# Patient Record
Sex: Female | Born: 1990
Health system: Southern US, Community
[De-identification: ages and names within clinical notes are randomized; demographics above are authoritative.]

## PROBLEM LIST (undated history)

## (undated) ENCOUNTER — Emergency Department (HOSPITAL_COMMUNITY): Admission: EM | Payer: Medicaid Other | Source: Home / Self Care

## (undated) DIAGNOSIS — E785 Hyperlipidemia, unspecified: Secondary | ICD-10-CM

## (undated) DIAGNOSIS — T7840XA Allergy, unspecified, initial encounter: Secondary | ICD-10-CM

## (undated) DIAGNOSIS — R945 Abnormal results of liver function studies: Secondary | ICD-10-CM

## (undated) DIAGNOSIS — B019 Varicella without complication: Secondary | ICD-10-CM

## (undated) DIAGNOSIS — Z0000 Encounter for general adult medical examination without abnormal findings: Secondary | ICD-10-CM

## (undated) DIAGNOSIS — F329 Major depressive disorder, single episode, unspecified: Secondary | ICD-10-CM

## (undated) DIAGNOSIS — J45909 Unspecified asthma, uncomplicated: Secondary | ICD-10-CM

## (undated) DIAGNOSIS — I10 Essential (primary) hypertension: Secondary | ICD-10-CM

## (undated) DIAGNOSIS — F418 Other specified anxiety disorders: Secondary | ICD-10-CM

## (undated) DIAGNOSIS — E663 Overweight: Secondary | ICD-10-CM

## (undated) DIAGNOSIS — Z7289 Other problems related to lifestyle: Secondary | ICD-10-CM

## (undated) DIAGNOSIS — F32A Depression, unspecified: Secondary | ICD-10-CM

## (undated) DIAGNOSIS — G4700 Insomnia, unspecified: Principal | ICD-10-CM

## (undated) HISTORY — DX: Overweight: E66.3

## (undated) HISTORY — DX: Unspecified asthma, uncomplicated: J45.909

## (undated) HISTORY — DX: Depression, unspecified: F32.A

## (undated) HISTORY — DX: Other problems related to lifestyle: Z72.89

## (undated) HISTORY — DX: Hyperlipidemia, unspecified: E78.5

## (undated) HISTORY — DX: Encounter for general adult medical examination without abnormal findings: Z00.00

## (undated) HISTORY — DX: Major depressive disorder, single episode, unspecified: F32.9

## (undated) HISTORY — DX: Insomnia, unspecified: G47.00

## (undated) HISTORY — DX: Abnormal results of liver function studies: R94.5

## (undated) HISTORY — PX: WISDOM TOOTH EXTRACTION: SHX21

## (undated) HISTORY — DX: Varicella without complication: B01.9

## (undated) HISTORY — DX: Essential (primary) hypertension: I10

## (undated) HISTORY — DX: Other specified anxiety disorders: F41.8

## (undated) HISTORY — DX: Allergy, unspecified, initial encounter: T78.40XA

---

## 2012-05-28 ENCOUNTER — Ambulatory Visit (INDEPENDENT_AMBULATORY_CARE_PROVIDER_SITE_OTHER): Payer: Managed Care, Other (non HMO) | Admitting: Family Medicine

## 2012-05-28 ENCOUNTER — Encounter: Payer: Self-pay | Admitting: Family Medicine

## 2012-05-28 VITALS — BP 116/81 | HR 91 | Temp 98.2°F | Ht 60.25 in | Wt 137.4 lb

## 2012-05-28 DIAGNOSIS — Z0000 Encounter for general adult medical examination without abnormal findings: Secondary | ICD-10-CM

## 2012-05-28 DIAGNOSIS — F418 Other specified anxiety disorders: Secondary | ICD-10-CM | POA: Insufficient documentation

## 2012-05-28 DIAGNOSIS — F32A Depression, unspecified: Secondary | ICD-10-CM

## 2012-05-28 DIAGNOSIS — J45909 Unspecified asthma, uncomplicated: Secondary | ICD-10-CM

## 2012-05-28 DIAGNOSIS — Z23 Encounter for immunization: Secondary | ICD-10-CM

## 2012-05-28 DIAGNOSIS — F3289 Other specified depressive episodes: Secondary | ICD-10-CM

## 2012-05-28 DIAGNOSIS — F329 Major depressive disorder, single episode, unspecified: Secondary | ICD-10-CM

## 2012-05-28 DIAGNOSIS — T7840XA Allergy, unspecified, initial encounter: Secondary | ICD-10-CM

## 2012-05-28 HISTORY — DX: Encounter for general adult medical examination without abnormal findings: Z00.00

## 2012-05-28 MED ORDER — TETANUS-DIPHTH-ACELL PERTUSSIS 5-2.5-18.5 LF-MCG/0.5 IM SUSP: 0.5000 mL | Freq: Once | INTRAMUSCULAR | Status: DC

## 2012-05-28 MED ORDER — MONTELUKAST SODIUM 10 MG PO TABS: 10.0000 mg | ORAL_TABLET | Freq: Every evening | ORAL | Status: DC | PRN

## 2012-05-28 MED ORDER — CETIRIZINE HCL 10 MG PO TABS: 10.0000 mg | ORAL_TABLET | Freq: Every day | ORAL | Status: DC | PRN

## 2012-05-28 NOTE — Assessment & Plan Note (Signed)
On Lexapro, Wellbutrin and Vistaril

## 2012-05-28 NOTE — Assessment & Plan Note (Addendum)
Given Tdap and flu shot today. Old records requested. Encouraged seat belts, heart healthy diet, increased exercise

## 2012-05-28 NOTE — Assessment & Plan Note (Addendum)
Will add Singulair, take Zyrtec daily.

## 2012-05-28 NOTE — Progress Notes (Signed)
Patient ID: Cynthia Salazar, female   DOB: November 05, 1990, 21 y.o.   MRN: [id number] [facility] [id number] 1991-04-24 05/28/2012      Progress Note New Patient  Subjective  Chief Complaint  Chief Complaint  Patient presents with  . Establish Care    new patient    HPI  Patient is a 21 year old Caucasian female in today to establish care. She is generally in good health although she does struggle with asthma. She reports using her albuterol roughly 5-6 times a week. She needs to exercise but she also uses it randomly up to 6 times each week. She's had occasional pattern. She does struggle with allergies nasal congestion postnasal drip. No recent, fevers, chills, chest pain or palpitations, GI or GU complaints. She struggles with was done is current on his medicines about 6 months and does believe is helping. Follows with&  Past Medical History  Diagnosis Date  . Chicken pox as a child  . Depression 21 yrs old  . Preventative health care 05/28/2012    Past Surgical History  Procedure Date  . Wisdom tooth extraction 21 yrs    Family History  Problem Relation Age of Onset  . Nephrolithiasis Father   . Nephrolithiasis Brother   . COPD Paternal Grandmother     History   Social History  . Marital Status: Single    Spouse Name: N/A    Number of Children: N/A  . Years of Education: N/A   Occupational History  . Not on file.   Social History Main Topics  . Smoking status: Former Games developer  . Smokeless tobacco: Never Used   Comment: occasionally smoked one  . Alcohol Use: Yes     occasionally  . Drug Use: No  . Sexually Active: Yes -- Female partner(s)   Other Topics Concern  . Not on file   Social History Narrative  . No narrative on file    Current Outpatient Prescriptions on File Prior to Visit  Medication Sig Dispense Refill  . albuterol (PROVENTIL HFA;VENTOLIN HFA) 108 (90 BASE) MCG/ACT inhaler Inhale 2 puffs into the lungs every 4 (four) hours as needed.        Marland Kitchen buPROPion (WELLBUTRIN XL) 150 MG 24 hr tablet Take 150 mg by mouth daily.      Marland Kitchen escitalopram (LEXAPRO) 10 MG tablet Take 10 mg by mouth daily.      Marland Kitchen lamoTRIgine (LAMICTAL) 100 MG tablet Take 100 mg by mouth daily.        Allergies  Allergen Reactions  . Aleve (Naproxen Sodium)     wheeze    Review of Systems  Review of Systems  Constitutional: Negative for fever, chills and malaise/fatigue.  HENT: Negative for hearing loss, nosebleeds and congestion.   Eyes: Negative for discharge.  Respiratory: Negative for cough, sputum production, shortness of breath and wheezing.   Cardiovascular: Negative for chest pain, palpitations and leg swelling.  Gastrointestinal: Negative for heartburn, nausea, vomiting, abdominal pain, diarrhea, constipation and blood in stool.  Genitourinary: Negative for dysuria, urgency, frequency and hematuria.  Musculoskeletal: Negative for myalgias, back pain and falls.  Skin: Negative for rash.  Neurological: Negative for dizziness, tremors, sensory change, focal weakness, loss of consciousness, weakness and headaches.  Endo/Heme/Allergies: Negative for polydipsia. Does not bruise/bleed easily.  Psychiatric/Behavioral: Negative for depression and suicidal ideas. The patient is not nervous/anxious and does not have insomnia.     Objective  BP 116/81  Pulse 91  Temp 98.2 F (36.8 C) (Temporal)  Ht  5' 0.25" (1.53 m)  Wt 137 lb 6.4 oz (62.324 kg)  BMI 26.61 kg/m2  SpO2 97%  LMP 05/12/2012  Physical Exam  Physical Exam  Constitutional: She is oriented to person, place, and time and well-developed, well-nourished, and in no distress. No distress.  HENT:  Head: Normocephalic and atraumatic.  Right Ear: External ear normal.  Left Ear: External ear normal.  Nose: Nose normal.  Mouth/Throat: Oropharynx is clear and moist. No oropharyngeal exudate.  Eyes: Conjunctivae normal are normal. Pupils are equal, round, and reactive to light. Right eye exhibits  no discharge. Left eye exhibits no discharge. No scleral icterus.  Neck: Normal range of motion. Neck supple. No thyromegaly present.  Cardiovascular: Normal rate, regular rhythm, normal heart sounds and intact distal pulses.   No murmur heard. Pulmonary/Chest: Effort normal and breath sounds normal. No respiratory distress. She has no wheezes. She has no rales.  Abdominal: Soft. Bowel sounds are normal. She exhibits no distension and no mass. There is no tenderness.  Musculoskeletal: Normal range of motion. She exhibits no edema and no tenderness.  Lymphadenopathy:    She has no cervical adenopathy.  Neurological: She is alert and oriented to person, place, and time. She has normal reflexes. No cranial nerve deficit. Coordination normal.  Skin: Skin is warm and dry. No rash noted. She is not diaphoretic.  Psychiatric: Mood, memory and affect normal.       Assessment & Plan   Depression On Lexapro, Wellbutrin and Vistaril  Preventative health care Given Tdap and flu shot today. Old records requested. Encouraged seat belts, heart healthy diet, increased exercise  Allergic state Will add Singulair, take Zyrtec daily.  Asthma Add Singulair and continue Albuterol prn

## 2012-05-28 NOTE — Patient Instructions (Addendum)
Preventive Care for Adults, Female A healthy lifestyle and preventive care can promote health and wellness. Preventive health guidelines for women include the following key practices.  A routine yearly physical is a good way to check with your caregiver about your health and preventive screening. It is a chance to share any concerns and updates on your health, and to receive a thorough exam.   Visit your dentist for a routine exam and preventive care every 6 months. Brush your teeth twice a day and floss once a day. Good oral hygiene prevents tooth decay and gum disease.   The frequency of eye exams is based on your age, health, family medical history, use of contact lenses, and other factors. Follow your caregiver's recommendations for frequency of eye exams.   Eat a healthy diet. Foods like vegetables, fruits, whole grains, low-fat dairy products, and lean protein foods contain the nutrients you need without too many calories. Decrease your intake of foods high in solid fats, added sugars, and salt. Eat the right amount of calories for you.Get information about a proper diet from your caregiver, if necessary.   Regular physical exercise is one of the most important things you can do for your health. Most adults should get at least 150 minutes of moderate-intensity exercise (any activity that increases your heart rate and causes you to sweat) each week. In addition, most adults need muscle-strengthening exercises on 2 or more days a week.   Maintain a healthy weight. The body mass index (BMI) is a screening tool to identify possible weight problems. It provides an estimate of body fat based on height and weight. Your caregiver can help determine your BMI, and can help you achieve or maintain a healthy weight.For adults 20 years and older:   A BMI below 18.5 is considered underweight.   A BMI of 18.5 to 24.9 is normal.   A BMI of 25 to 29.9 is considered overweight.   A BMI of 30 and above is  considered obese.   Maintain normal blood lipids and cholesterol levels by exercising and minimizing your intake of saturated fat. Eat a balanced diet with plenty of fruit and vegetables. Blood tests for lipids and cholesterol should begin at age 20 and be repeated every 5 years. If your lipid or cholesterol levels are high, you are over 50, or you are at high risk for heart disease, you may need your cholesterol levels checked more frequently.Ongoing high lipid and cholesterol levels should be treated with medicines if diet and exercise are not effective.   If you smoke, find out from your caregiver how to quit. If you do not use tobacco, do not start.   If you are pregnant, do not drink alcohol. If you are breastfeeding, be very cautious about drinking alcohol. If you are not pregnant and choose to drink alcohol, do not exceed 1 drink per day. One drink is considered to be 12 ounces (355 mL) of beer, 5 ounces (148 mL) of wine, or 1.5 ounces (44 mL) of liquor.   Avoid use of street drugs. Do not share needles with anyone. Ask for help if you need support or instructions about stopping the use of drugs.   High blood pressure causes heart disease and increases the risk of stroke. Your blood pressure should be checked at least every 1 to 2 years. Ongoing high blood pressure should be treated with medicines if weight loss and exercise are not effective.   If you are 55 to 22   years old, ask your caregiver if you should take aspirin to prevent strokes.   Diabetes screening involves taking a blood sample to check your fasting blood sugar level. This should be done once every 3 years, after age 45, if you are within normal weight and without risk factors for diabetes. Testing should be considered at a younger age or be carried out more frequently if you are overweight and have at least 1 risk factor for diabetes.   Breast cancer screening is essential preventive care for women. You should practice "breast  self-awareness." This means understanding the normal appearance and feel of your breasts and may include breast self-examination. Any changes detected, no matter how small, should be reported to a caregiver. Women in their 20s and 30s should have a clinical breast exam (CBE) by a caregiver as part of a regular health exam every 1 to 3 years. After age 40, women should have a CBE every year. Starting at age 40, women should consider having a mammography (breast X-ray test) every year. Women who have a family history of breast cancer should talk to their caregiver about genetic screening. Women at a high risk of breast cancer should talk to their caregivers about having magnetic resonance imaging (MRI) and a mammography every year.   The Pap test is a screening test for cervical cancer. A Pap test can show cell changes on the cervix that might become cervical cancer if left untreated. A Pap test is a procedure in which cells are obtained and examined from the lower end of the uterus (cervix).   Women should have a Pap test starting at age 21.   Between ages 21 and 29, Pap tests should be repeated every 2 years.   Beginning at age 30, you should have a Pap test every 3 years as long as the past 3 Pap tests have been normal.   Some women have medical problems that increase the chance of getting cervical cancer. Talk to your caregiver about these problems. It is especially important to talk to your caregiver if a new problem develops soon after your last Pap test. In these cases, your caregiver may recommend more frequent screening and Pap tests.   The above recommendations are the same for women who have or have not gotten the vaccine for human papillomavirus (HPV).   If you had a hysterectomy for a problem that was not cancer or a condition that could lead to cancer, then you no longer need Pap tests. Even if you no longer need a Pap test, a regular exam is a good idea to make sure no other problems are  starting.   If you are between ages 65 and 70, and you have had normal Pap tests going back 10 years, you no longer need Pap tests. Even if you no longer need a Pap test, a regular exam is a good idea to make sure no other problems are starting.   If you have had past treatment for cervical cancer or a condition that could lead to cancer, you need Pap tests and screening for cancer for at least 20 years after your treatment.   If Pap tests have been discontinued, risk factors (such as a new sexual partner) need to be reassessed to determine if screening should be resumed.   The HPV test is an additional test that may be used for cervical cancer screening. The HPV test looks for the virus that can cause the cell changes on the cervix.   The cells collected during the Pap test can be tested for HPV. The HPV test could be used to screen women aged 30 years and older, and should be used in women of any age who have unclear Pap test results. After the age of 30, women should have HPV testing at the same frequency as a Pap test.   Colorectal cancer can be detected and often prevented. Most routine colorectal cancer screening begins at the age of 50 and continues through age 75. However, your caregiver may recommend screening at an earlier age if you have risk factors for colon cancer. On a yearly basis, your caregiver may provide home test kits to check for hidden blood in the stool. Use of a small camera at the end of a tube, to directly examine the colon (sigmoidoscopy or colonoscopy), can detect the earliest forms of colorectal cancer. Talk to your caregiver about this at age 50, when routine screening begins. Direct examination of the colon should be repeated every 5 to 10 years through age 75, unless early forms of pre-cancerous polyps or small growths are found.   Hepatitis C blood testing is recommended for all people born from 1945 through 1965 and any individual with known risks for hepatitis C.    Practice safe sex. Use condoms and avoid high-risk sexual practices to reduce the spread of sexually transmitted infections (STIs). STIs include gonorrhea, chlamydia, syphilis, trichomonas, herpes, HPV, and human immunodeficiency virus (HIV). Herpes, HIV, and HPV are viral illnesses that have no cure. They can result in disability, cancer, and death. Sexually active women aged 25 and younger should be checked for chlamydia. Older women with new or multiple partners should also be tested for chlamydia. Testing for other STIs is recommended if you are sexually active and at increased risk.   Osteoporosis is a disease in which the bones lose minerals and strength with aging. This can result in serious bone fractures. The risk of osteoporosis can be identified using a bone density scan. Women ages 65 and over and women at risk for fractures or osteoporosis should discuss screening with their caregivers. Ask your caregiver whether you should take a calcium supplement or vitamin D to reduce the rate of osteoporosis.   Menopause can be associated with physical symptoms and risks. Hormone replacement therapy is available to decrease symptoms and risks. You should talk to your caregiver about whether hormone replacement therapy is right for you.   Use sunscreen with sun protection factor (SPF) of 30 or more. Apply sunscreen liberally and repeatedly throughout the day. You should seek shade when your shadow is shorter than you. Protect yourself by wearing long sleeves, pants, a wide-brimmed hat, and sunglasses year round, whenever you are outdoors.   Once a month, do a whole body skin exam, using a mirror to look at the skin on your back. Notify your caregiver of new moles, moles that have irregular borders, moles that are larger than a pencil eraser, or moles that have changed in shape or color.   Stay current with required immunizations.   Influenza. You need a dose every fall (or winter). The composition of  the flu vaccine changes each year, so being vaccinated once is not enough.   Pneumococcal polysaccharide. You need 1 to 2 doses if you smoke cigarettes or if you have certain chronic medical conditions. You need 1 dose at age 65 (or older) if you have never been vaccinated.   Tetanus, diphtheria, pertussis (Tdap, Td). Get 1 dose of   Tdap vaccine if you are younger than age 65, are over 65 and have contact with an infant, are a healthcare worker, are pregnant, or simply want to be protected from whooping cough. After that, you need a Td booster dose every 10 years. Consult your caregiver if you have not had at least 3 tetanus and diphtheria-containing shots sometime in your life or have a deep or dirty wound.   HPV. You need this vaccine if you are a woman age 26 or younger. The vaccine is given in 3 doses over 6 months.   Measles, mumps, rubella (MMR). You need at least 1 dose of MMR if you were born in 1957 or later. You may also need a second dose.   Meningococcal. If you are age 19 to 21 and a first-year college student living in a residence hall, or have one of several medical conditions, you need to get vaccinated against meningococcal disease. You may also need additional booster doses.   Zoster (shingles). If you are age 60 or older, you should get this vaccine.   Varicella (chickenpox). If you have never had chickenpox or you were vaccinated but received only 1 dose, talk to your caregiver to find out if you need this vaccine.   Hepatitis A. You need this vaccine if you have a specific risk factor for hepatitis A virus infection or you simply wish to be protected from this disease. The vaccine is usually given as 2 doses, 6 to 18 months apart.   Hepatitis B. You need this vaccine if you have a specific risk factor for hepatitis B virus infection or you simply wish to be protected from this disease. The vaccine is given in 3 doses, usually over 6 months.  Preventive Services /  Frequency Ages 19 to 39  Blood pressure check.** / Every 1 to 2 years.   Lipid and cholesterol check.** / Every 5 years beginning at age 20.   Clinical breast exam.** / Every 3 years for women in their 20s and 30s.   Pap test.** / Every 2 years from ages 21 through 29. Every 3 years starting at age 30 through age 65 or 70 with a history of 3 consecutive normal Pap tests.   HPV screening.** / Every 3 years from ages 30 through ages 65 to 70 with a history of 3 consecutive normal Pap tests.   Hepatitis C blood test.** / For any individual with known risks for hepatitis C.   Skin self-exam. / Monthly.   Influenza immunization.** / Every year.   Pneumococcal polysaccharide immunization.** / 1 to 2 doses if you smoke cigarettes or if you have certain chronic medical conditions.   Tetanus, diphtheria, pertussis (Tdap, Td) immunization. / A one-time dose of Tdap vaccine. After that, you need a Td booster dose every 10 years.   HPV immunization. / 3 doses over 6 months, if you are 26 and younger.   Measles, mumps, rubella (MMR) immunization. / You need at least 1 dose of MMR if you were born in 1957 or later. You may also need a second dose.   Meningococcal immunization. / 1 dose if you are age 19 to 21 and a first-year college student living in a residence hall, or have one of several medical conditions, you need to get vaccinated against meningococcal disease. You may also need additional booster doses.   Varicella immunization.** / Consult your caregiver.   Hepatitis A immunization.** / Consult your caregiver. 2 doses, 6 to 18 months   apart.   Hepatitis B immunization.** / Consult your caregiver. 3 doses usually over 6 months.  Ages 40 to 64  Blood pressure check.** / Every 1 to 2 years.   Lipid and cholesterol check.** / Every 5 years beginning at age 20.   Clinical breast exam.** / Every year after age 40.   Mammogram.** / Every year beginning at age 40 and continuing for as  long as you are in good health. Consult with your caregiver.   Pap test.** / Every 3 years starting at age 30 through age 65 or 70 with a history of 3 consecutive normal Pap tests.   HPV screening.** / Every 3 years from ages 30 through ages 65 to 70 with a history of 3 consecutive normal Pap tests.   Fecal occult blood test (FOBT) of stool. / Every year beginning at age 50 and continuing until age 75. You may not need to do this test if you get a colonoscopy every 10 years.   Flexible sigmoidoscopy or colonoscopy.** / Every 5 years for a flexible sigmoidoscopy or every 10 years for a colonoscopy beginning at age 50 and continuing until age 75.   Hepatitis C blood test.** / For all people born from 1945 through 1965 and any individual with known risks for hepatitis C.   Skin self-exam. / Monthly.   Influenza immunization.** / Every year.   Pneumococcal polysaccharide immunization.** / 1 to 2 doses if you smoke cigarettes or if you have certain chronic medical conditions.   Tetanus, diphtheria, pertussis (Tdap, Td) immunization.** / A one-time dose of Tdap vaccine. After that, you need a Td booster dose every 10 years.   Measles, mumps, rubella (MMR) immunization. / You need at least 1 dose of MMR if you were born in 1957 or later. You may also need a second dose.   Varicella immunization.** / Consult your caregiver.   Meningococcal immunization.** / Consult your caregiver.   Hepatitis A immunization.** / Consult your caregiver. 2 doses, 6 to 18 months apart.   Hepatitis B immunization.** / Consult your caregiver. 3 doses, usually over 6 months.  Ages 65 and over  Blood pressure check.** / Every 1 to 2 years.   Lipid and cholesterol check.** / Every 5 years beginning at age 20.   Clinical breast exam.** / Every year after age 40.   Mammogram.** / Every year beginning at age 40 and continuing for as long as you are in good health. Consult with your caregiver.   Pap test.** /  Every 3 years starting at age 30 through age 65 or 70 with a 3 consecutive normal Pap tests. Testing can be stopped between 65 and 70 with 3 consecutive normal Pap tests and no abnormal Pap or HPV tests in the past 10 years.   HPV screening.** / Every 3 years from ages 30 through ages 65 or 70 with a history of 3 consecutive normal Pap tests. Testing can be stopped between 65 and 70 with 3 consecutive normal Pap tests and no abnormal Pap or HPV tests in the past 10 years.   Fecal occult blood test (FOBT) of stool. / Every year beginning at age 50 and continuing until age 75. You may not need to do this test if you get a colonoscopy every 10 years.   Flexible sigmoidoscopy or colonoscopy.** / Every 5 years for a flexible sigmoidoscopy or every 10 years for a colonoscopy beginning at age 50 and continuing until age 75.   Hepatitis   C blood test.** / For all people born from 1945 through 1965 and any individual with known risks for hepatitis C.   Osteoporosis screening.** / A one-time screening for women ages 65 and over and women at risk for fractures or osteoporosis.   Skin self-exam. / Monthly.   Influenza immunization.** / Every year.   Pneumococcal polysaccharide immunization.** / 1 dose at age 65 (or older) if you have never been vaccinated.   Tetanus, diphtheria, pertussis (Tdap, Td) immunization. / A one-time dose of Tdap vaccine if you are over 65 and have contact with an infant, are a healthcare worker, or simply want to be protected from whooping cough. After that, you need a Td booster dose every 10 years.   Varicella immunization.** / Consult your caregiver.   Meningococcal immunization.** / Consult your caregiver.   Hepatitis A immunization.** / Consult your caregiver. 2 doses, 6 to 18 months apart.   Hepatitis B immunization.** / Check with your caregiver. 3 doses, usually over 6 months.  ** Family history and personal history of risk and conditions may change your caregiver's  recommendations. Document Released: 10/16/2001 Document Revised: 08/09/2011 Document Reviewed: 01/15/2011 [employer] Patient Information 2012 [employer]. 

## 2012-05-29 ENCOUNTER — Encounter: Payer: Self-pay | Admitting: Family Medicine

## 2012-05-29 DIAGNOSIS — J45909 Unspecified asthma, uncomplicated: Secondary | ICD-10-CM

## 2012-05-29 DIAGNOSIS — J4520 Mild intermittent asthma, uncomplicated: Secondary | ICD-10-CM | POA: Insufficient documentation

## 2012-05-29 HISTORY — DX: Unspecified asthma, uncomplicated: J45.909

## 2012-05-29 MED ORDER — ALBUTEROL SULFATE HFA 108 (90 BASE) MCG/ACT IN AERS: 2.0000 | INHALATION_SPRAY | RESPIRATORY_TRACT | Status: DC | PRN

## 2012-05-29 NOTE — Assessment & Plan Note (Signed)
Add Singulair and continue Albuterol prn

## 2012-11-15 ENCOUNTER — Other Ambulatory Visit: Payer: Self-pay | Admitting: Family Medicine

## 2013-03-17 ENCOUNTER — Ambulatory Visit (INDEPENDENT_AMBULATORY_CARE_PROVIDER_SITE_OTHER): Payer: Managed Care, Other (non HMO) | Admitting: Family Medicine

## 2013-03-17 ENCOUNTER — Encounter: Payer: Self-pay | Admitting: Family Medicine

## 2013-03-17 VITALS — BP 110/80 | HR 104 | Temp 98.4°F | Ht 60.25 in | Wt 164.1 lb

## 2013-03-17 DIAGNOSIS — R945 Abnormal results of liver function studies: Secondary | ICD-10-CM

## 2013-03-17 DIAGNOSIS — J45909 Unspecified asthma, uncomplicated: Secondary | ICD-10-CM

## 2013-03-17 DIAGNOSIS — F32A Depression, unspecified: Secondary | ICD-10-CM

## 2013-03-17 DIAGNOSIS — J4530 Mild persistent asthma, uncomplicated: Secondary | ICD-10-CM

## 2013-03-17 DIAGNOSIS — F329 Major depressive disorder, single episode, unspecified: Secondary | ICD-10-CM

## 2013-03-17 DIAGNOSIS — R7989 Other specified abnormal findings of blood chemistry: Secondary | ICD-10-CM

## 2013-03-17 DIAGNOSIS — Z0000 Encounter for general adult medical examination without abnormal findings: Secondary | ICD-10-CM

## 2013-03-17 DIAGNOSIS — G4700 Insomnia, unspecified: Secondary | ICD-10-CM

## 2013-03-17 DIAGNOSIS — E785 Hyperlipidemia, unspecified: Secondary | ICD-10-CM

## 2013-03-17 HISTORY — DX: Insomnia, unspecified: G47.00

## 2013-03-17 MED ORDER — ALPRAZOLAM 0.25 MG PO TABS: 0.2500 mg | ORAL_TABLET | Freq: Every evening | ORAL | Status: DC | PRN

## 2013-03-17 NOTE — Patient Instructions (Addendum)
Next visit annual with GYN if needed  Insomnia Insomnia is frequent trouble falling and/or staying asleep. Insomnia can be a long term problem or a short term problem. Both are common. Insomnia can be a short term problem when the wakefulness is related to a certain stress or worry. Long term insomnia is often related to ongoing stress during waking hours and/or poor sleeping habits. Overtime, sleep deprivation itself can make the problem worse. Every little thing feels more severe because you are overtired and your ability to cope is decreased. CAUSES   Stress, anxiety, and depression.  Poor sleeping habits.  Distractions such as TV in the bedroom.  Naps close to bedtime.  Engaging in emotionally charged conversations before bed.  Technical reading before sleep.  Alcohol and other sedatives. They may make the problem worse. They can hurt normal sleep patterns and normal dream activity.  Stimulants such as caffeine for several hours prior to bedtime.  Pain syndromes and shortness of breath can cause insomnia.  Exercise late at night.  Changing time zones may cause sleeping problems (jet lag). It is sometimes helpful to have someone observe your sleeping patterns. They should look for periods of not breathing during the night (sleep apnea). They should also look to see how long those periods last. If you live alone or observers are uncertain, you can also be observed at a sleep clinic where your sleep patterns will be professionally monitored. Sleep apnea requires a checkup and treatment. Give your caregivers your medical history. Give your caregivers observations your family has made about your sleep.  SYMPTOMS   Not feeling rested in the morning.  Anxiety and restlessness at bedtime.  Difficulty falling and staying asleep. TREATMENT   Your caregiver may prescribe treatment for an underlying medical disorders. Your caregiver can give advice or help if you are using alcohol or  other drugs for self-medication. Treatment of underlying problems will usually eliminate insomnia problems.  Medications can be prescribed for short time use. They are generally not recommended for lengthy use.  Over-the-counter sleep medicines are not recommended for lengthy use. They can be habit forming.  You can promote easier sleeping by making lifestyle changes such as:  Using relaxation techniques that help with breathing and reduce muscle tension.  Exercising earlier in the day.  Changing your diet and the time of your last meal. No night time snacks.  Establish a regular time to go to bed.  Counseling can help with stressful problems and worry.  Soothing music and white noise may be helpful if there are background noises you cannot remove.  Stop tedious detailed work at least one hour before bedtime. HOME CARE INSTRUCTIONS   Keep a diary. Inform your caregiver about your progress. This includes any medication side effects. See your caregiver regularly. Take note of:  Times when you are asleep.  Times when you are awake during the night.  The quality of your sleep.  How you feel the next day. This information will help your caregiver care for you.  Get out of bed if you are still awake after 15 minutes. Read or do some quiet activity. Keep the lights down. Wait until you feel sleepy and go back to bed.  Keep regular sleeping and waking hours. Avoid naps.  Exercise regularly.  Avoid distractions at bedtime. Distractions include watching television or engaging in any intense or detailed activity like attempting to balance the household checkbook.  Develop a bedtime ritual. Keep a familiar routine of bathing, brushing  your teeth, climbing into bed at the same time each night, listening to soothing music. Routines increase the success of falling to sleep faster.  Use relaxation techniques. This can be using breathing and muscle tension release routines. It can also  include visualizing peaceful scenes. You can also help control troubling or intruding thoughts by keeping your mind occupied with boring or repetitive thoughts like the old concept of counting sheep. You can make it more creative like imagining planting one beautiful flower after another in your backyard garden.  During your day, work to eliminate stress. When this is not possible use some of the previous suggestions to help reduce the anxiety that accompanies stressful situations. MAKE SURE YOU:   Understand these instructions.  Will watch your condition.  Will get help right away if you are not doing well or get worse. Document Released: 08/17/2000 Document Revised: 11/12/2011 Document Reviewed: 09/17/2007 [facility] Patient Information 2014 [place], [place].

## 2013-03-18 ENCOUNTER — Telehealth: Payer: Self-pay

## 2013-03-18 ENCOUNTER — Encounter: Payer: Self-pay | Admitting: Family Medicine

## 2013-03-18 DIAGNOSIS — R945 Abnormal results of liver function studies: Secondary | ICD-10-CM

## 2013-03-18 DIAGNOSIS — E785 Hyperlipidemia, unspecified: Secondary | ICD-10-CM

## 2013-03-18 DIAGNOSIS — R7989 Other specified abnormal findings of blood chemistry: Secondary | ICD-10-CM

## 2013-03-18 DIAGNOSIS — E782 Mixed hyperlipidemia: Secondary | ICD-10-CM | POA: Insufficient documentation

## 2013-03-18 HISTORY — DX: Abnormal results of liver function studies: R94.5

## 2013-03-18 HISTORY — DX: Other specified abnormal findings of blood chemistry: R79.89

## 2013-03-18 HISTORY — DX: Hyperlipidemia, unspecified: E78.5

## 2013-03-18 LAB — LIPID PANEL
HDL: 68 mg/dL (ref >39)
LDL Cholesterol: 146 mg/dL — ABNORMAL HIGH (ref 0–99)
Triglycerides: 170 mg/dL — ABNORMAL HIGH (ref <150)
VLDL: 34 mg/dL (ref 0–40)

## 2013-03-18 LAB — CBC
Platelets: 265 10*3/uL (ref 150–400)
RDW: 14 % (ref 11.5–15.5)
WBC: 10.4 10*3/uL (ref 4.0–10.5)

## 2013-03-18 LAB — HEPATIC FUNCTION PANEL
Alkaline Phosphatase: 59 U/L (ref 39–117)
Bilirubin, Direct: 0.1 mg/dL (ref 0.0–0.3)
Indirect Bilirubin: 0.2 mg/dL (ref 0.0–0.9)
Total Bilirubin: 0.3 mg/dL (ref 0.3–1.2)

## 2013-03-18 LAB — RENAL FUNCTION PANEL
Chloride: 102 mEq/L (ref 96–112)
Phosphorus: 3.8 mg/dL (ref 2.3–4.6)
Potassium: 4.7 mEq/L (ref 3.5–5.3)

## 2013-03-18 NOTE — Telephone Encounter (Signed)
Labs ordered and pt informed and voiced understanding

## 2013-03-18 NOTE — Telephone Encounter (Signed)
Message copied by Court Joy on Wed Mar 18, 2013  9:47 AM ------      Message from: Danise Edge A      Created: Wed Mar 18, 2013  9:42 AM       I completed her labs before sending a note. Her cholesterol is up fairly significantly and her ALT is up slightly the treatment for both is decrease the simple carbs, saturated fats and stop the trans fats/partially hydrogenated oils and fast food. Increase exercise and start a krill oil cap daily called [employer] (cheap at [employer]) then she should come back in about 6 months with labs prior to include lipid, renal, hepatic to further investigate ------

## 2013-03-18 NOTE — Assessment & Plan Note (Signed)
Doing well on current course of medications, am willing to write refills for her at this time

## 2013-03-18 NOTE — Telephone Encounter (Signed)
Message copied by Court Joy on Wed Mar 18, 2013  3:57 PM ------      Message from: Danise Edge A      Created: Wed Mar 18, 2013  1:15 PM       I agreed to refill her psychiatric meds she is supposed to be calling to let us know if she needs a 30 day supply or 90 day supplym may have a 6 month total supply ------

## 2013-03-18 NOTE — Progress Notes (Signed)
Patient ID: Cynthia Salazar, female   DOB: Oct 05, 1990, 22 y.o.   MRN: [id number] Minnetta Sandora [id number] [birth year] 03/18/2013      Progress Note-Follow Up  Subjective  Chief Complaint  Chief Complaint  Patient presents with  . Follow-up    medication refill    HPI  Patient is a 22 year old Caucasian female who is here today in followup. Feeling well. No recent flare in allergies or asthma. No recent illness. Her largest complaint is of difficulty sleeping. She has tried numerous over-the-counter medications including melatonin and has not had relief and often does not follow 5 AM. Has some dreams on her current antidepressant regimen but otherwise feels it is helping her stabilize her mood. No complaints of chest pain, headache, palpitations, shortness of breath, GI or GU concerns  Past Medical History  Diagnosis Date  . Chicken pox as a child  . Depression 78 yrs old  . Preventative health care 05/28/2012  . Asthma   . Allergy   . Asthma 05/29/2012  . Insomnia 03/17/2013  . Other and unspecified hyperlipidemia 03/18/2013  . Abnormal liver function test 03/18/2013  . Asthma 05/29/2012    Past Surgical History  Procedure Laterality Date  . Wisdom tooth extraction  19 yrs    Family History  Problem Relation Age of Onset  . Nephrolithiasis Father   . Nephrolithiasis Brother   . COPD Paternal Grandmother     History   Social History  . Marital Status: Single    Spouse Name: N/A    Number of Children: N/A  . Years of Education: N/A   Occupational History  . Not on file.   Social History Main Topics  . Smoking status: Former Games developer  . Smokeless tobacco: Never Used     Comment: occasionally smoked one  . Alcohol Use: Yes     Comment: occasionally  . Drug Use: No  . Sexually Active: Yes -- Female partner(s)   Other Topics Concern  . Not on file   Social History Narrative  . No narrative on file    Current Outpatient Prescriptions on File Prior to Visit   Medication Sig Dispense Refill  . albuterol (PROVENTIL HFA;VENTOLIN HFA) 108 (90 BASE) MCG/ACT inhaler Inhale 2 puffs into the lungs every 4 (four) hours as needed.  1 Inhaler  2  . buPROPion (WELLBUTRIN XL) 150 MG 24 hr tablet Take 150 mg by mouth daily.      . cetirizine (ZYRTEC) 10 MG tablet TAKE 1 TABLET BY MOUTH DAILY AS NEEDED FOR ALLERGIES  30 tablet  5  . escitalopram (LEXAPRO) 10 MG tablet Take 10 mg by mouth daily.      Marland Kitchen lamoTRIgine (LAMICTAL) 100 MG tablet Take 100 mg by mouth daily.      . montelukast (SINGULAIR) 10 MG tablet TAKE 1 TABLET BY MOUTH AT BEDTIME AS NEEDED  30 tablet  5   No current facility-administered medications on file prior to visit.    Allergies  Allergen Reactions  . Aleve (Naproxen Sodium)     wheeze    Review of Systems  Review of Systems  Constitutional: Negative for fever and malaise/fatigue.  HENT: Negative for congestion.   Eyes: Negative for pain and discharge.  Respiratory: Negative for shortness of breath.   Cardiovascular: Negative for chest pain, palpitations and leg swelling.  Gastrointestinal: Negative for nausea, abdominal pain and diarrhea.  Genitourinary: Negative for dysuria.  Musculoskeletal: Negative for falls.  Skin: Negative for rash.  Neurological: Negative for loss  of consciousness and headaches.  Endo/Heme/Allergies: Negative for polydipsia.  Psychiatric/Behavioral: Negative for depression and suicidal ideas. The patient has insomnia. The patient is not nervous/anxious.     Objective  BP 110/80  Pulse 104  Temp(Src) 98.4 F (36.9 C) (Oral)  Ht 5' 0.25" (1.53 m)  Wt 164 lb 1.9 oz (74.444 kg)  BMI 31.8 kg/m2  SpO2 96%  LMP 03/09/2013  Physical Exam  Physical Exam  Constitutional: She is oriented to person, place, and time and well-developed, well-nourished, and in no distress. No distress.  HENT:  Head: Normocephalic and atraumatic.  Eyes: Conjunctivae are normal.  Neck: Neck supple. No thyromegaly present.   Cardiovascular: Normal rate, regular rhythm and normal heart sounds.  Exam reveals no gallop.   No murmur heard. Pulmonary/Chest: Effort normal and breath sounds normal. She has no wheezes.  Abdominal: She exhibits no distension and no mass.  Musculoskeletal: She exhibits no edema.  Lymphadenopathy:    She has no cervical adenopathy.  Neurological: She is alert and oriented to person, place, and time.  Skin: Skin is warm and dry. No rash noted. She is not diaphoretic.  Psychiatric: Memory, affect and judgment normal.    Lab Results  Component Value Date   TSH 2.947 03/17/2013   Lab Results  Component Value Date   WBC 10.4 03/17/2013   HGB 14.4 03/17/2013   HCT 43.7 03/17/2013   MCV 91.0 03/17/2013   PLT 265 03/17/2013   Lab Results  Component Value Date   CREATININE 0.97 03/17/2013   BUN 14 03/17/2013   NA 139 03/17/2013   K 4.7 03/17/2013   CL 102 03/17/2013   CO2 27 03/17/2013   Lab Results  Component Value Date   ALT 52* 03/17/2013   AST 33 03/17/2013   ALKPHOS 59 03/17/2013   BILITOT 0.3 03/17/2013   Lab Results  Component Value Date   CHOL 248* 03/17/2013   Lab Results  Component Value Date   HDL 68 03/17/2013   Lab Results  Component Value Date   LDLCALC 146* 03/17/2013   Lab Results  Component Value Date   TRIG 170* 03/17/2013   Lab Results  Component Value Date   CHOLHDL 3.6 03/17/2013     Assessment & Plan  Abnormal liver function test Likely fatty liver disease, minimize saturated fats and simple carbs. Avoid trans fats. Increase exercise, attempt mild weight loss  Other and unspecified hyperlipidemia Avoid trans fats, increase exercise add Krill oil caps. Recheck in 6 months  Mild intermittent asthma No recent flares. Continue current meds.  Insomnia Up until 4 am frequently, given a short course of Alprazolam to use along with sleep hygiene to try and reset her internal clock.  Depression Doing well on current course of medications, am willing to  write refills for her at this time

## 2013-03-18 NOTE — Assessment & Plan Note (Signed)
Likely fatty liver disease, minimize saturated fats and simple carbs. Avoid trans fats. Increase exercise, attempt mild weight loss

## 2013-03-18 NOTE — Assessment & Plan Note (Signed)
No recent flares. Continue current meds 

## 2013-03-18 NOTE — Assessment & Plan Note (Signed)
Up until 4 am frequently, given a short course of Alprazolam to use along with sleep hygiene to try and reset her internal clock.

## 2013-03-18 NOTE — Assessment & Plan Note (Signed)
Avoid trans fats, increase exercise add Krill oil caps. Recheck in 6 months

## 2013-03-23 ENCOUNTER — Ambulatory Visit: Payer: Managed Care, Other (non HMO) | Admitting: Family Medicine

## 2013-05-14 ENCOUNTER — Other Ambulatory Visit: Payer: Self-pay | Admitting: Family Medicine

## 2013-06-02 ENCOUNTER — Other Ambulatory Visit: Payer: Self-pay | Admitting: Family Medicine

## 2013-07-09 ENCOUNTER — Other Ambulatory Visit: Payer: Self-pay

## 2013-07-18 ENCOUNTER — Other Ambulatory Visit: Payer: Self-pay | Admitting: Family Medicine

## 2013-08-14 ENCOUNTER — Telehealth: Payer: Self-pay | Admitting: Family Medicine

## 2013-08-14 MED ORDER — LAMOTRIGINE 100 MG PO TABS: 100.0000 mg | ORAL_TABLET | Freq: Every day | ORAL | Status: DC

## 2013-08-14 NOTE — Telephone Encounter (Signed)
Request refill on Lamotrigine 100mg  Tab  Take 1&1/2 tab by mouth everyday Qty 45 with 3 refills

## 2013-09-22 ENCOUNTER — Encounter: Payer: Managed Care, Other (non HMO) | Admitting: Family Medicine

## 2013-11-13 ENCOUNTER — Ambulatory Visit (INDEPENDENT_AMBULATORY_CARE_PROVIDER_SITE_OTHER): Payer: BC Managed Care – PPO | Admitting: Family Medicine

## 2013-11-13 ENCOUNTER — Encounter: Payer: Self-pay | Admitting: Family Medicine

## 2013-11-13 VITALS — BP 128/108 | HR 78 | Temp 98.2°F | Ht 60.25 in | Wt 172.0 lb

## 2013-11-13 DIAGNOSIS — F418 Other specified anxiety disorders: Secondary | ICD-10-CM

## 2013-11-13 DIAGNOSIS — F341 Dysthymic disorder: Secondary | ICD-10-CM

## 2013-11-13 DIAGNOSIS — J45909 Unspecified asthma, uncomplicated: Secondary | ICD-10-CM

## 2013-11-13 DIAGNOSIS — F329 Major depressive disorder, single episode, unspecified: Secondary | ICD-10-CM

## 2013-11-13 DIAGNOSIS — F419 Anxiety disorder, unspecified: Secondary | ICD-10-CM

## 2013-11-13 DIAGNOSIS — J4520 Mild intermittent asthma, uncomplicated: Secondary | ICD-10-CM

## 2013-11-13 DIAGNOSIS — T7840XA Allergy, unspecified, initial encounter: Secondary | ICD-10-CM

## 2013-11-13 DIAGNOSIS — G4700 Insomnia, unspecified: Secondary | ICD-10-CM

## 2013-11-13 DIAGNOSIS — M533 Sacrococcygeal disorders, not elsewhere classified: Secondary | ICD-10-CM

## 2013-11-13 MED ORDER — ALPRAZOLAM 0.25 MG PO TABS: 0.2500 mg | ORAL_TABLET | Freq: Every evening | ORAL | Status: DC | PRN

## 2013-11-13 MED ORDER — CYCLOBENZAPRINE HCL 10 MG PO TABS: 10.0000 mg | ORAL_TABLET | Freq: Every evening | ORAL | Status: DC | PRN

## 2013-11-13 MED ORDER — ESCITALOPRAM OXALATE 10 MG PO TABS: 10.0000 mg | ORAL_TABLET | Freq: Every day | ORAL | Status: DC

## 2013-11-13 MED ORDER — BUPROPION HCL ER (XL) 150 MG PO TB24: 150.0000 mg | ORAL_TABLET | Freq: Every day | ORAL | Status: DC

## 2013-11-13 NOTE — Patient Instructions (Signed)
Melatonin 2 to 10 mg at bedtime and then add a Diphenhydramine/Benadryl 25 mg   Moist heat and then gentle stretching and topical Salon Pas patches Sacroiliac Joint Dysfunction The sacroiliac joint connects the lower part of the spine (the sacrum) with the bones of the pelvis. CAUSES  Sometimes, there is no obvious reason for sacroiliac joint dysfunction. Other times, it may occur   During pregnancy.  After injury, such as:  Car accidents.  Sport-related injuries.  Work-related injuries.  Due to one leg being shorter than the other.  Due to other conditions that affect the joints, such as:  Rheumatoid arthritis.  Gout.  Psoriasis.  Joint infection (septic arthritis). SYMPTOMS  Symptoms may include:  Pain in the:  Lower back.  Buttocks.  Groin.  Thighs and legs.  Difficult sitting, standing, walking, lying, bending or lifting. DIAGNOSIS  A number of tests may be used to help diagnose the cause of sacroiliac joint dysfunction, including:  Imaging tests to look for other causes of pain, including:  MRI.  CT scan.  Bone scan.  Diagnostic injection: During a special x-ray (called fluoroscopy), a needle is put into the sacroiliac joint. A numbing medicine is injected into the joint. If the pain is improved or stopped, the diagnosis of sacroiliac joint dysfunction is more likely. TREATMENT  There are a number of types of treatment used for sacroiliac joint dysfunction, including:  Only take over-the-counter or prescription medicines for pain, discomfort, or fever as directed by your caregiver.  Medications to relax muscles.  Rest. Decreasing activity can help cut down on painful muscle spasms and allow the back to heal.  Application of heat or ice to the lower back may improve muscle spasms and soothe pain.  Brace. A special back brace, called a sacroiliac belt, can help support the joint while your back is healing.  Physical therapy can help teach  comfortable positions and exercises to strengthen muscles that support the sacroiliac joint.  Cortisone injections. Injections of steroid medicine into the joint can help decrease swelling and improve pain.  Hyaluronic acid injections. This chemical improves lubrication within the sacroiliac joint, thereby decreasing pain.  Radiofrequency ablation. A special needle is placed into the joint, where it burns away nerves that are carrying pain messages from the joint.  Surgery. Because pain occurs during movement of the joint, screws and plates may be installed in order to limit or prevent joint motion. HOME CARE INSTRUCTIONS   Take all medications exactly as directed.  Follow instructions regarding both rest and physical activity, to avoid worsening the pain.  Do physical therapy exercises exactly as prescribed. SEEK IMMEDIATE MEDICAL CARE IF:  You experience increasingly severe pain.  You develop new symptoms, such as numbness or tingling in your legs or feet.  You lose bladder or bowel control. Document Released: 11/16/2008 Document Revised: 11/12/2011 Document Reviewed: 11/16/2008 [facility] Patient Information 2014 [place], [place].   Insomnia Insomnia is frequent trouble falling and/or staying asleep. Insomnia can be a long term problem or a short term problem. Both are common. Insomnia can be a short term problem when the wakefulness is related to a certain stress or worry. Long term insomnia is often related to ongoing stress during waking hours and/or poor sleeping habits. Overtime, sleep deprivation itself can make the problem worse. Every little thing feels more severe because you are overtired and your ability to cope is decreased. CAUSES   Stress, anxiety, and depression.  Poor sleeping habits.  Distractions such as TV in  the bedroom.  Naps close to bedtime.  Engaging in emotionally charged conversations before bed.  Technical reading before sleep.  Alcohol and other  sedatives. They may make the problem worse. They can hurt normal sleep patterns and normal dream activity.  Stimulants such as caffeine for several hours prior to bedtime.  Pain syndromes and shortness of breath can cause insomnia.  Exercise late at night.  Changing time zones may cause sleeping problems (jet lag). It is sometimes helpful to have someone observe your sleeping patterns. They should look for periods of not breathing during the night (sleep apnea). They should also look to see how long those periods last. If you live alone or observers are uncertain, you can also be observed at a sleep clinic where your sleep patterns will be professionally monitored. Sleep apnea requires a checkup and treatment. Give your caregivers your medical history. Give your caregivers observations your family has made about your sleep.  SYMPTOMS   Not feeling rested in the morning.  Anxiety and restlessness at bedtime.  Difficulty falling and staying asleep. TREATMENT   Your caregiver may prescribe treatment for an underlying medical disorders. Your caregiver can give advice or help if you are using alcohol or other drugs for self-medication. Treatment of underlying problems will usually eliminate insomnia problems.  Medications can be prescribed for short time use. They are generally not recommended for lengthy use.  Over-the-counter sleep medicines are not recommended for lengthy use. They can be habit forming.  You can promote easier sleeping by making lifestyle changes such as:  Using relaxation techniques that help with breathing and reduce muscle tension.  Exercising earlier in the day.  Changing your diet and the time of your last meal. No night time snacks.  Establish a regular time to go to bed.  Counseling can help with stressful problems and worry.  Soothing music and white noise may be helpful if there are background noises you cannot remove.  Stop tedious detailed work at  least one hour before bedtime. HOME CARE INSTRUCTIONS   Keep a diary. Inform your caregiver about your progress. This includes any medication side effects. See your caregiver regularly. Take note of:  Times when you are asleep.  Times when you are awake during the night.  The quality of your sleep.  How you feel the next day. This information will help your caregiver care for you.  Get out of bed if you are still awake after 15 minutes. Read or do some quiet activity. Keep the lights down. Wait until you feel sleepy and go back to bed.  Keep regular sleeping and waking hours. Avoid naps.  Exercise regularly.  Avoid distractions at bedtime. Distractions include watching television or engaging in any intense or detailed activity like attempting to balance the household checkbook.  Develop a bedtime ritual. Keep a familiar routine of bathing, brushing your teeth, climbing into bed at the same time each night, listening to soothing music. Routines increase the success of falling to sleep faster.  Use relaxation techniques. This can be using breathing and muscle tension release routines. It can also include visualizing peaceful scenes. You can also help control troubling or intruding thoughts by keeping your mind occupied with boring or repetitive thoughts like the old concept of counting sheep. You can make it more creative like imagining planting one beautiful flower after another in your backyard garden.  During your day, work to eliminate stress. When this is not possible use some of the previous suggestions  to help reduce the anxiety that accompanies stressful situations. MAKE SURE YOU:   Understand these instructions.  Will watch your condition.  Will get help right away if you are not doing well or get worse. Document Released: 08/17/2000 Document Revised: 11/12/2011 Document Reviewed: 09/17/2007 [facility] Patient Information 2014 [place], [place].

## 2013-11-13 NOTE — Progress Notes (Signed)
Pre visit review using our clinic review tool, if applicable. No additional management support is needed unless otherwise documented below in the visit note. 

## 2013-11-18 ENCOUNTER — Encounter: Payer: Self-pay | Admitting: Family Medicine

## 2013-11-18 DIAGNOSIS — M545 Low back pain, unspecified: Secondary | ICD-10-CM | POA: Insufficient documentation

## 2013-11-18 NOTE — Progress Notes (Signed)
Patient ID: Cynthia Salazar, female   DOB: April 02, 1991, 23 y.o.   MRN: [id number] Cynthia Salazar [id number] 1991-06-22 11/18/2013      Progress Note-Follow Up  Subjective  Chief Complaint  Chief Complaint  Patient presents with  . Follow-up    medication refill    HPI  Patient is a 23 year old female in today for routine medical care. She is in today for medication refills and to discuss left hip pain.  She has been struggling with hip pain for several weeks now. Denies any injury or,. No radicular symptoms. No incontinence. The pain is in the posterior hip and worse with twisting, arising and bending. No GI or GU concerns. Reports her anxiety and depression is well controlled on Lexapro and Wellbutrin. Does have some trouble sleeping at times. Both falling asleep and staying asleep. Denies CP/palp/SOB/HA/congestion/fevers/GI or GU c/o. Taking meds as prescribed  Past Medical History  Diagnosis Date  . Chicken pox as a child  . Depression 37 yrs old  . Preventative health care 05/28/2012  . Asthma   . Allergy   . Asthma 05/29/2012  . Insomnia 03/17/2013  . Other and unspecified hyperlipidemia 03/18/2013  . Abnormal liver function test 03/18/2013  . Asthma 05/29/2012  . Depression with anxiety     Past Surgical History  Procedure Laterality Date  . Wisdom tooth extraction  19 yrs    Family History  Problem Relation Age of Onset  . Nephrolithiasis Father   . Nephrolithiasis Brother   . COPD Paternal Grandmother     History   Social History  . Marital Status: Single    Spouse Name: N/A    Number of Children: N/A  . Years of Education: N/A   Occupational History  . Not on file.   Social History Main Topics  . Smoking status: Former Games developer  . Smokeless tobacco: Never Used     Comment: occasionally smoked one  . Alcohol Use: Yes     Comment: occasionally  . Drug Use: No  . Sexual Activity: Yes    Partners: Female   Other Topics Concern  . Not on file   Social  History Narrative  . No narrative on file    Current Outpatient Prescriptions on File Prior to Visit  Medication Sig Dispense Refill  . cetirizine (ZYRTEC) 10 MG tablet TAKE 1 TABLET BY MOUTH DAILY AS NEEDED FOR ALLERGIES  30 tablet  5  . montelukast (SINGULAIR) 10 MG tablet TAKE 1 TABLET BY MOUTH AT BEDTIME AS NEEDED  30 tablet  5  . PROAIR HFA 108 (90 BASE) MCG/ACT inhaler INHALE 2 PUFFS INTO THE LUNGS EVERY 4 (FOUR) HOURS AS NEEDED.  8.5 each  1   No current facility-administered medications on file prior to visit.    Allergies  Allergen Reactions  . Aleve [Naproxen Sodium]     wheeze    Review of Systems  Review of Systems  Constitutional: Negative for fever and malaise/fatigue.  HENT: Negative for congestion.   Eyes: Negative for discharge.  Respiratory: Negative for shortness of breath.   Cardiovascular: Negative for chest pain, palpitations and leg swelling.  Gastrointestinal: Negative for nausea, abdominal pain and diarrhea.  Genitourinary: Negative for dysuria.  Musculoskeletal: Positive for joint pain. Negative for falls.       Left hip pain worse with twisting and arising  Skin: Negative for rash.  Neurological: Negative for loss of consciousness and headaches.  Endo/Heme/Allergies: Negative for polydipsia.  Psychiatric/Behavioral: Negative for depression and  suicidal ideas. The patient is nervous/anxious and has insomnia.     Objective  BP 128/108  Pulse 78  Temp(Src) 98.2 F (36.8 C) (Oral)  Ht 5' 0.25" (1.53 m)  Wt 172 lb (78.019 kg)  BMI 33.33 kg/m2  SpO2 97%  LMP 10/25/2013  Physical Exam  Physical Exam  Constitutional: She is oriented to person, place, and time and well-developed, well-nourished, and in no distress. No distress.  HENT:  Head: Normocephalic and atraumatic.  Eyes: Conjunctivae are normal.  Neck: Neck supple. No thyromegaly present.  Cardiovascular: Normal rate, regular rhythm and normal heart sounds.   No murmur  heard. Pulmonary/Chest: Effort normal and breath sounds normal. She has no wheezes.  Abdominal: She exhibits no distension and no mass.  Musculoskeletal: She exhibits no edema.  Lymphadenopathy:    She has no cervical adenopathy.  Neurological: She is alert and oriented to person, place, and time.  Skin: Skin is warm and dry. No rash noted. She is not diaphoretic.  Psychiatric: Memory, affect and judgment normal.    Lab Results  Component Value Date   TSH 2.947 03/17/2013   Lab Results  Component Value Date   WBC 10.4 03/17/2013   HGB 14.4 03/17/2013   HCT 43.7 03/17/2013   MCV 91.0 03/17/2013   PLT 265 03/17/2013   Lab Results  Component Value Date   CREATININE 0.97 03/17/2013   BUN 14 03/17/2013   NA 139 03/17/2013   K 4.7 03/17/2013   CL 102 03/17/2013   CO2 27 03/17/2013   Lab Results  Component Value Date   ALT 52* 03/17/2013   AST 33 03/17/2013   ALKPHOS 59 03/17/2013   BILITOT 0.3 03/17/2013   Lab Results  Component Value Date   CHOL 248* 03/17/2013   Lab Results  Component Value Date   HDL 68 03/17/2013   Lab Results  Component Value Date   LDLCALC 146* 03/17/2013   Lab Results  Component Value Date   TRIG 170* 03/17/2013   Lab Results  Component Value Date   CHOLHDL 3.6 03/17/2013     Assessment & Plan  Depression with anxiety Doing well on Lexapro and Wellbutrin given refills today  Allergic state Responds to Cetirizine prn, can add Flonase prn  Insomnia Encouraged Melatonin prn. Encouraged good sleep hygiene such as dark, quiet room. No blue/green glowing lights such as computer screens in bedroom. No alcohol or stimulants in evening. Cut down on caffeine as able. Regular exercise is helpful but not just prior to bed time.   Sacro ilial pain Flexeril prn. Encouraged moist heat and gentle stretching as tolerated. May try NSAIDs and prescription meds as directed and report if symptoms worsen or seek immediate care  Mild intermittent asthma Continue  current meds prn, no recent flare

## 2013-11-18 NOTE — Assessment & Plan Note (Signed)
Flexeril prn. Encouraged moist heat and gentle stretching as tolerated. May try NSAIDs and prescription meds as directed and report if symptoms worsen or seek immediate care

## 2013-11-18 NOTE — Assessment & Plan Note (Signed)
Doing well on Lexapro and Wellbutrin given refills today

## 2013-11-18 NOTE — Assessment & Plan Note (Signed)
Continue current meds prn, no recent flare

## 2013-11-18 NOTE — Assessment & Plan Note (Signed)
Encouraged Melatonin prn. Encouraged good sleep hygiene such as dark, quiet room. No blue/green glowing lights such as computer screens in bedroom. No alcohol or stimulants in evening. Cut down on caffeine as able. Regular exercise is helpful but not just prior to bed time.

## 2013-11-18 NOTE — Assessment & Plan Note (Signed)
Responds to Cetirizine prn, can add Flonase prn

## 2013-11-25 ENCOUNTER — Telehealth: Payer: Self-pay | Admitting: Family Medicine

## 2013-11-25 MED ORDER — MONTELUKAST SODIUM 10 MG PO TABS: ORAL_TABLET | ORAL | Status: DC

## 2013-11-25 MED ORDER — CETIRIZINE HCL 10 MG PO TABS: ORAL_TABLET | ORAL | Status: DC

## 2013-11-25 NOTE — Telephone Encounter (Signed)
Refills sent

## 2013-11-25 NOTE — Telephone Encounter (Signed)
Refill- cetirizine  Refill- montelukast

## 2014-04-01 ENCOUNTER — Encounter: Payer: Self-pay | Admitting: Family Medicine

## 2014-04-01 ENCOUNTER — Ambulatory Visit (INDEPENDENT_AMBULATORY_CARE_PROVIDER_SITE_OTHER): Payer: BC Managed Care – PPO | Admitting: Family Medicine

## 2014-04-01 VITALS — BP 124/88 | HR 84 | Temp 98.7°F | Ht 60.25 in | Wt 161.0 lb

## 2014-04-01 DIAGNOSIS — J4520 Mild intermittent asthma, uncomplicated: Secondary | ICD-10-CM

## 2014-04-01 DIAGNOSIS — E663 Overweight: Secondary | ICD-10-CM

## 2014-04-01 DIAGNOSIS — T7840XD Allergy, unspecified, subsequent encounter: Secondary | ICD-10-CM

## 2014-04-01 DIAGNOSIS — G4700 Insomnia, unspecified: Secondary | ICD-10-CM

## 2014-04-01 DIAGNOSIS — J45909 Unspecified asthma, uncomplicated: Secondary | ICD-10-CM

## 2014-04-01 DIAGNOSIS — Z5189 Encounter for other specified aftercare: Secondary | ICD-10-CM

## 2014-04-01 DIAGNOSIS — F418 Other specified anxiety disorders: Secondary | ICD-10-CM

## 2014-04-01 DIAGNOSIS — F341 Dysthymic disorder: Secondary | ICD-10-CM

## 2014-04-01 MED ORDER — MONTELUKAST SODIUM 10 MG PO TABS: ORAL_TABLET | ORAL | Status: DC

## 2014-04-01 MED ORDER — TRIAMCINOLONE ACETONIDE 0.1 % EX CREA: 1.0000 "application " | TOPICAL_CREAM | Freq: Two times a day (BID) | CUTANEOUS | Status: DC | PRN

## 2014-04-01 MED ORDER — CETIRIZINE HCL 10 MG PO TABS: ORAL_TABLET | ORAL | Status: DC

## 2014-04-01 MED ORDER — ALPRAZOLAM 0.25 MG PO TABS: 0.2500 mg | ORAL_TABLET | Freq: Every evening | ORAL | Status: DC | PRN

## 2014-04-01 NOTE — Patient Instructions (Signed)

## 2014-04-05 ENCOUNTER — Encounter: Payer: Self-pay | Admitting: Family Medicine

## 2014-04-05 DIAGNOSIS — E663 Overweight: Secondary | ICD-10-CM

## 2014-04-05 DIAGNOSIS — E66811 Obesity, class 1: Secondary | ICD-10-CM | POA: Insufficient documentation

## 2014-04-05 DIAGNOSIS — E669 Obesity, unspecified: Secondary | ICD-10-CM | POA: Insufficient documentation

## 2014-04-05 HISTORY — DX: Overweight: E66.3

## 2014-04-05 NOTE — Assessment & Plan Note (Signed)
Good response to combination of Lexapro and Wellbutrin may continue

## 2014-04-05 NOTE — Progress Notes (Signed)
Patient ID: Cynthia Salazar, female   DOB: 22-Oct-1990, 23 y.o.   MRN: [id number] Ellinore Merced [id number] February 08, 1991 04/05/2014      Progress Note-Follow Up  Subjective  Chief Complaint  No chief complaint on file.   HPI  Patient is a 23 year old female in today for routine medical care. No recent illness or concerns. She feels her meds are working well. Needs rescue meds very infrequently. Denies CP/palp/SOB/HA/congestion/fevers/GI or GU c/o. Taking meds as prescribed  Past Medical History  Diagnosis Date  . Chicken pox as a child  . Depression 76 yrs old  . Preventative health care 05/28/2012  . Asthma   . Allergy   . Asthma 05/29/2012  . Insomnia 03/17/2013  . Other and unspecified hyperlipidemia 03/18/2013  . Abnormal liver function test 03/18/2013  . Asthma 05/29/2012  . Depression with anxiety   . Overweight 04/05/2014    Past Surgical History  Procedure Laterality Date  . Wisdom tooth extraction  20 yrs    Family History  Problem Relation Age of Onset  . Nephrolithiasis Father   . Nephrolithiasis Brother   . COPD Paternal Grandmother     History   Social History  . Marital Status: Single    Spouse Name: N/A    Number of Children: N/A  . Years of Education: N/A   Occupational History  . Not on file.   Social History Main Topics  . Smoking status: Former Games developer  . Smokeless tobacco: Never Used     Comment: occasionally smoked one  . Alcohol Use: Yes     Comment: occasionally  . Drug Use: No  . Sexual Activity: Yes    Partners: Female   Other Topics Concern  . Not on file   Social History Narrative  . No narrative on file    Current Outpatient Prescriptions on File Prior to Visit  Medication Sig Dispense Refill  . buPROPion (WELLBUTRIN XL) 150 MG 24 hr tablet Take 1 tablet (150 mg total) by mouth daily.  30 tablet  5  . cyclobenzaprine (FLEXERIL) 10 MG tablet Take 1 tablet (10 mg total) by mouth at bedtime as needed for muscle spasms.  20 tablet  1   . escitalopram (LEXAPRO) 10 MG tablet Take 1 tablet (10 mg total) by mouth daily.  30 tablet  5  . PROAIR HFA 108 (90 BASE) MCG/ACT inhaler INHALE 2 PUFFS INTO THE LUNGS EVERY 4 (FOUR) HOURS AS NEEDED.  8.5 each  1   No current facility-administered medications on file prior to visit.    Allergies  Allergen Reactions  . Aleve [Naproxen Sodium]     wheeze    Review of Systems  Review of Systems  Constitutional: Negative for fever and malaise/fatigue.  HENT: Negative for congestion.   Eyes: Negative for discharge.  Respiratory: Negative for shortness of breath.   Cardiovascular: Negative for chest pain, palpitations and leg swelling.  Gastrointestinal: Negative for nausea, abdominal pain and diarrhea.  Genitourinary: Negative for dysuria.  Musculoskeletal: Negative for falls.  Skin: Negative for rash.  Neurological: Negative for loss of consciousness and headaches.  Endo/Heme/Allergies: Negative for polydipsia.  Psychiatric/Behavioral: Negative for depression and suicidal ideas. The patient is not nervous/anxious and does not have insomnia.     Objective  BP 124/88  Pulse 84  Temp(Src) 98.7 F (37.1 C) (Oral)  Ht 5' 0.25" (1.53 m)  Wt 161 lb (73.029 kg)  BMI 31.20 kg/m2  SpO2 93%  Physical Exam  Physical Exam  Constitutional: She is oriented to person, place, and time and well-developed, well-nourished, and in no distress. No distress.  HENT:  Head: Normocephalic and atraumatic.  Eyes: Conjunctivae are normal.  Neck: Neck supple. No thyromegaly present.  Cardiovascular: Normal rate, regular rhythm and normal heart sounds.   No murmur heard. Pulmonary/Chest: Effort normal and breath sounds normal. She has no wheezes.  Abdominal: She exhibits no distension and no mass.  Musculoskeletal: She exhibits no edema.  Lymphadenopathy:    She has no cervical adenopathy.  Neurological: She is alert and oriented to person, place, and time.  Skin: Skin is warm and dry. No  rash noted. She is not diaphoretic.  Psychiatric: Memory, affect and judgment normal.    Lab Results  Component Value Date   TSH 2.947 03/17/2013   Lab Results  Component Value Date   WBC 10.4 03/17/2013   HGB 14.4 03/17/2013   HCT 43.7 03/17/2013   MCV 91.0 03/17/2013   PLT 265 03/17/2013   Lab Results  Component Value Date   CREATININE 0.97 03/17/2013   BUN 14 03/17/2013   NA 139 03/17/2013   K 4.7 03/17/2013   CL 102 03/17/2013   CO2 27 03/17/2013   Lab Results  Component Value Date   ALT 52* 03/17/2013   AST 33 03/17/2013   ALKPHOS 59 03/17/2013   BILITOT 0.3 03/17/2013   Lab Results  Component Value Date   CHOL 248* 03/17/2013   Lab Results  Component Value Date   HDL 68 03/17/2013   Lab Results  Component Value Date   LDLCALC 146* 03/17/2013   Lab Results  Component Value Date   TRIG 170* 03/17/2013   Lab Results  Component Value Date   CHOLHDL 3.6 03/17/2013     Assessment & Plan  Mild intermittent asthma No recent flares, doing well on Singulair, needs very little Albuterol  Depression with anxiety Good response to combination of Lexapro and Wellbutrin may continue  Overweight Encouraged DASH diet, decrease po intake and increase exercise as tolerated. Needs 7-8 hours of sleep nightly. Avoid trans fats, eat small, frequent meals every 4-5 hours with lean proteins, complex carbs and healthy fats. Minimize simple carbs, GMO foods.

## 2014-04-05 NOTE — Assessment & Plan Note (Signed)
No recent flares, doing well on Singulair, needs very little Albuterol

## 2014-04-05 NOTE — Assessment & Plan Note (Signed)
Encouraged DASH diet, decrease po intake and increase exercise as tolerated. Needs 7-8 hours of sleep nightly. Avoid trans fats, eat small, frequent meals every 4-5 hours with lean proteins, complex carbs and healthy fats. Minimize simple carbs, GMO foods. 

## 2014-05-31 ENCOUNTER — Other Ambulatory Visit: Payer: Self-pay | Admitting: Family Medicine

## 2014-08-21 ENCOUNTER — Other Ambulatory Visit: Payer: Self-pay | Admitting: Family Medicine

## 2014-08-23 NOTE — Telephone Encounter (Signed)
Requesting: cyclobenzaprine (FLEXERIL) 10 MG tablet Last OV 04/01/2014 Prescribed as PRN 11/2013 #20 with refill  Please Advise if ok to refill

## 2014-09-30 ENCOUNTER — Ambulatory Visit (INDEPENDENT_AMBULATORY_CARE_PROVIDER_SITE_OTHER): Payer: BLUE CROSS/BLUE SHIELD | Admitting: Family Medicine

## 2014-09-30 ENCOUNTER — Encounter: Payer: Self-pay | Admitting: Family Medicine

## 2014-09-30 VITALS — BP 120/82 | HR 85 | Temp 98.3°F | Ht 60.75 in | Wt 162.0 lb

## 2014-09-30 DIAGNOSIS — Z0000 Encounter for general adult medical examination without abnormal findings: Secondary | ICD-10-CM

## 2014-09-30 DIAGNOSIS — F329 Major depressive disorder, single episode, unspecified: Secondary | ICD-10-CM

## 2014-09-30 DIAGNOSIS — J4520 Mild intermittent asthma, uncomplicated: Secondary | ICD-10-CM

## 2014-09-30 DIAGNOSIS — F419 Anxiety disorder, unspecified: Principal | ICD-10-CM

## 2014-09-30 DIAGNOSIS — E663 Overweight: Secondary | ICD-10-CM

## 2014-09-30 DIAGNOSIS — E782 Mixed hyperlipidemia: Secondary | ICD-10-CM

## 2014-09-30 DIAGNOSIS — F418 Other specified anxiety disorders: Secondary | ICD-10-CM

## 2014-09-30 LAB — HM PAP SMEAR

## 2014-09-30 MED ORDER — ESCITALOPRAM OXALATE 20 MG PO TABS: 20.0000 mg | ORAL_TABLET | Freq: Every day | ORAL | Status: DC

## 2014-09-30 MED ORDER — ALPRAZOLAM 0.5 MG PO TABS: 0.5000 mg | ORAL_TABLET | Freq: Two times a day (BID) | ORAL | Status: DC | PRN

## 2014-09-30 NOTE — Progress Notes (Signed)
Olar Santini  [id number] [birth year] 09/30/2014      Progress Note-Follow Up  Subjective  Chief Complaint  Chief Complaint  Patient presents with  . Annual Exam    no pap smear(GYN)-non-fasting    HPI  Patient is a 24 y.o. female in today for routine medical care. Doing well, here today for annual exam. The Lexapro worked very well in the beginning but is not working as well now. Denies any suicidal or homicidal ideation but does acknowledge anhedonia and worsening anxiety. Denies CP/palp/SOB/HA/congestion/fevers/GI or GU c/o. Taking meds as prescribed  Past Medical History  Diagnosis Date  . Chicken pox as a child  . Depression 58 yrs old  . Preventative health care 05/28/2012  . Asthma   . Allergy   . Asthma 05/29/2012  . Insomnia 03/17/2013  . Other and unspecified hyperlipidemia 03/18/2013  . Abnormal liver function test 03/18/2013  . Asthma 05/29/2012  . Depression with anxiety   . Overweight 04/05/2014    Past Surgical History  Procedure Laterality Date  . Wisdom tooth extraction  20 yrs    Family History  Problem Relation Age of Onset  . Nephrolithiasis Father   . Nephrolithiasis Brother   . COPD Paternal Grandmother     History   Social History  . Marital Status: Single    Spouse Name: N/A    Number of Children: N/A  . Years of Education: N/A   Occupational History  . Not on file.   Social History Main Topics  . Smoking status: Former Games developer  . Smokeless tobacco: Never Used     Comment: occasionally smoked one  . Alcohol Use: Yes     Comment: occasionally  . Drug Use: No  . Sexual Activity:    Partners: Female   Other Topics Concern  . Not on file   Social History Narrative    Current Outpatient Prescriptions on File Prior to Visit  Medication Sig Dispense Refill  . ALPRAZolam (XANAX) 0.25 MG tablet Take 1 tablet (0.25 mg total) by mouth at bedtime as needed for sleep or anxiety. 20 tablet 2  . buPROPion (WELLBUTRIN XL) 150 MG 24 hr  tablet TAKE 1 TABLET BY MOUTH EVERY DAY 30 tablet 5  . cetirizine (ZYRTEC) 10 MG tablet TAKE 1 TABLET BY MOUTH DAILY AS NEEDED FOR ALLERGIES 30 tablet 4  . cyclobenzaprine (FLEXERIL) 10 MG tablet TAKE 1 TABLET BY MOUTH AT BEDTIME AS NEEDED FOR MUSCLE SPASMS 20 tablet 1  . escitalopram (LEXAPRO) 10 MG tablet TAKE 1 TABLET BY MOUTH EVERY DAY 30 tablet 5  . montelukast (SINGULAIR) 10 MG tablet TAKE 1 TABLET BY MOUTH AT BEDTIME AS NEEDED 30 tablet 4  . PROAIR HFA 108 (90 BASE) MCG/ACT inhaler INHALE 2 PUFFS INTO THE LUNGS EVERY 4 (FOUR) HOURS AS NEEDED. 8.5 each 1  . triamcinolone cream (KENALOG) 0.1 % Apply 1 application topically 2 (two) times daily as needed. dermatitis 45 g 1   No current facility-administered medications on file prior to visit.    Allergies  Allergen Reactions  . Aleve [Naproxen Sodium]     wheeze    Review of Systems  Review of Systems  Constitutional: Negative for fever, chills and malaise/fatigue.  HENT: Negative for congestion, hearing loss and nosebleeds.   Eyes: Negative for discharge.  Respiratory: Negative for cough, sputum production, shortness of breath and wheezing.   Cardiovascular: Negative for chest pain, palpitations and leg swelling.  Gastrointestinal: Negative for heartburn, nausea, vomiting, abdominal pain, diarrhea,  constipation and blood in stool.  Genitourinary: Negative for dysuria, urgency, frequency and hematuria.  Musculoskeletal: Negative for myalgias, back pain and falls.  Skin: Negative for rash.  Neurological: Negative for dizziness, tremors, sensory change, focal weakness, loss of consciousness, weakness and headaches.  Endo/Heme/Allergies: Negative for polydipsia. Does not bruise/bleed easily.  Psychiatric/Behavioral: Positive for depression. Negative for suicidal ideas. The patient is nervous/anxious and has insomnia.     Objective  BP 136/97 mmHg  Pulse 85  Temp(Src) 98.3 F (36.8 C) (Oral)  Ht 5' 0.75" (1.543 m)  Wt 162 lb  (73.483 kg)  BMI 30.86 kg/m2  SpO2 98%  LMP 09/04/2014  Physical Exam  Physical Exam  Constitutional: She is oriented to person, place, and time and well-developed, well-nourished, and in no distress. No distress.  HENT:  Head: Normocephalic and atraumatic.  Right Ear: External ear normal.  Left Ear: External ear normal.  Nose: Nose normal.  Mouth/Throat: Oropharynx is clear and moist. No oropharyngeal exudate.  Eyes: Conjunctivae are normal. Pupils are equal, round, and reactive to light. Right eye exhibits no discharge. Left eye exhibits no discharge. No scleral icterus.  Neck: Normal range of motion. Neck supple. No thyromegaly present.  Cardiovascular: Normal rate, regular rhythm, normal heart sounds and intact distal pulses.   No murmur heard. Pulmonary/Chest: Effort normal and breath sounds normal. No respiratory distress. She has no wheezes. She has no rales.  Abdominal: Soft. Bowel sounds are normal. She exhibits no distension and no mass. There is no tenderness.  Musculoskeletal: Normal range of motion. She exhibits no edema or tenderness.  Lymphadenopathy:    She has no cervical adenopathy.  Neurological: She is alert and oriented to person, place, and time. She has normal reflexes. No cranial nerve deficit. Coordination normal.  Skin: Skin is warm and dry. No rash noted. She is not diaphoretic.  Psychiatric: Mood, memory and affect normal.    Lab Results  Component Value Date   TSH 2.947 03/17/2013   Lab Results  Component Value Date   WBC 10.4 03/17/2013   HGB 14.4 03/17/2013   HCT 43.7 03/17/2013   MCV 91.0 03/17/2013   PLT 265 03/17/2013   Lab Results  Component Value Date   CREATININE 0.97 03/17/2013   BUN 14 03/17/2013   NA 139 03/17/2013   K 4.7 03/17/2013   CL 102 03/17/2013   CO2 27 03/17/2013   Lab Results  Component Value Date   ALT 52* 03/17/2013   AST 33 03/17/2013   ALKPHOS 59 03/17/2013   BILITOT 0.3 03/17/2013   Lab Results    Component Value Date   CHOL 248* 03/17/2013   Lab Results  Component Value Date   HDL 68 03/17/2013   Lab Results  Component Value Date   LDLCALC 146* 03/17/2013   Lab Results  Component Value Date   TRIG 170* 03/17/2013   Lab Results  Component Value Date   CHOLHDL 3.6 03/17/2013     Assessment & Plan  Mild intermittent asthma No recent flares, continue Albuterol prn   Overweight Encouraged DASH diet, decrease po intake and increase exercise as tolerated. Needs 7-8 hours of sleep nightly. Avoid trans fats, eat small, frequent meals every 4-5 hours with lean proteins, complex carbs and healthy fats. Minimize simple carbs   Hyperlipidemia, mixed Encouraged heart healthy diet, increase exercise, avoid trans fats, consider a krill oil cap daily, recheck lipids next year   Preventative health care Patient encouraged to maintain heart healthy diet, regular exercise,  adequate sleep. Consider daily probiotics. Take medications as prescribed   Depression with anxiety Doing well on Escitalopram, continue but increase since it is not working as well as it did in the beginning

## 2014-09-30 NOTE — Progress Notes (Signed)
Pre visit review using our clinic review tool, if applicable. No additional management support is needed unless otherwise documented below in the visit note. 

## 2014-09-30 NOTE — Patient Instructions (Signed)
Preventive Care for Adults A healthy lifestyle and preventive care can promote health and wellness. Preventive health guidelines for women include the following key practices.  A routine yearly physical is a good way to check with your health care provider about your health and preventive screening. It is a chance to share any concerns and updates on your health and to receive a thorough exam.  Visit your dentist for a routine exam and preventive care every 6 months. Brush your teeth twice a day and floss once a day. Good oral hygiene prevents tooth decay and gum disease.  The frequency of eye exams is based on your age, health, family medical history, use of contact lenses, and other factors. Follow your health care provider's recommendations for frequency of eye exams.  Eat a healthy diet. Foods like vegetables, fruits, whole grains, low-fat dairy products, and lean protein foods contain the nutrients you need without too many calories. Decrease your intake of foods high in solid fats, added sugars, and salt. Eat the right amount of calories for you.Get information about a proper diet from your health care provider, if necessary.  Regular physical exercise is one of the most important things you can do for your health. Most adults should get at least 150 minutes of moderate-intensity exercise (any activity that increases your heart rate and causes you to sweat) each week. In addition, most adults need muscle-strengthening exercises on 2 or more days a week.  Maintain a healthy weight. The body mass index (BMI) is a screening tool to identify possible weight problems. It provides an estimate of body fat based on height and weight. Your health care provider can find your BMI and can help you achieve or maintain a healthy weight.For adults 20 years and older:  A BMI below 18.5 is considered underweight.  A BMI of 18.5 to 24.9 is normal.  A BMI of 25 to 29.9 is considered overweight.  A BMI of  30 and above is considered obese.  Maintain normal blood lipids and cholesterol levels by exercising and minimizing your intake of saturated fat. Eat a balanced diet with plenty of fruit and vegetables. Blood tests for lipids and cholesterol should begin at age 76 and be repeated every 5 years. If your lipid or cholesterol levels are high, you are over 50, or you are at high risk for heart disease, you may need your cholesterol levels checked more frequently.Ongoing high lipid and cholesterol levels should be treated with medicines if diet and exercise are not working.  If you smoke, find out from your health care provider how to quit. If you do not use tobacco, do not start.  Lung cancer screening is recommended for adults aged 22-80 years who are at high risk for developing lung cancer because of a history of smoking. A yearly low-dose CT scan of the lungs is recommended for people who have at least a 30-pack-year history of smoking and are a current smoker or have quit within the past 15 years. A pack year of smoking is smoking an average of 1 pack of cigarettes a day for 1 year (for example: 1 pack a day for 30 years or 2 packs a day for 15 years). Yearly screening should continue until the smoker has stopped smoking for at least 15 years. Yearly screening should be stopped for people who develop a health problem that would prevent them from having lung cancer treatment.  If you are pregnant, do not drink alcohol. If you are breastfeeding,  be very cautious about drinking alcohol. If you are not pregnant and choose to drink alcohol, do not have more than 1 drink per day. One drink is considered to be 12 ounces (355 mL) of beer, 5 ounces (148 mL) of wine, or 1.5 ounces (44 mL) of liquor.  Avoid use of street drugs. Do not share needles with anyone. Ask for help if you need support or instructions about stopping the use of drugs.  High blood pressure causes heart disease and increases the risk of  stroke. Your blood pressure should be checked at least every 1 to 2 years. Ongoing high blood pressure should be treated with medicines if weight loss and exercise do not work.  If you are 75-52 years old, ask your health care provider if you should take aspirin to prevent strokes.  Diabetes screening involves taking a blood sample to check your fasting blood sugar level. This should be done once every 3 years, after age 15, if you are within normal weight and without risk factors for diabetes. Testing should be considered at a younger age or be carried out more frequently if you are overweight and have at least 1 risk factor for diabetes.  Breast cancer screening is essential preventive care for women. You should practice "breast self-awareness." This means understanding the normal appearance and feel of your breasts and may include breast self-examination. Any changes detected, no matter how small, should be reported to a health care provider. Women in their 58s and 30s should have a clinical breast exam (CBE) by a health care provider as part of a regular health exam every 1 to 3 years. After age 16, women should have a CBE every year. Starting at age 53, women should consider having a mammogram (breast X-ray test) every year. Women who have a family history of breast cancer should talk to their health care provider about genetic screening. Women at a high risk of breast cancer should talk to their health care providers about having an MRI and a mammogram every year.  Breast cancer gene (BRCA)-related cancer risk assessment is recommended for women who have family members with BRCA-related cancers. BRCA-related cancers include breast, ovarian, tubal, and peritoneal cancers. Having family members with these cancers may be associated with an increased risk for harmful changes (mutations) in the breast cancer genes BRCA1 and BRCA2. Results of the assessment will determine the need for genetic counseling and  BRCA1 and BRCA2 testing.  Routine pelvic exams to screen for cancer are no longer recommended for nonpregnant women who are considered low risk for cancer of the pelvic organs (ovaries, uterus, and vagina) and who do not have symptoms. Ask your health care provider if a screening pelvic exam is right for you.  If you have had past treatment for cervical cancer or a condition that could lead to cancer, you need Pap tests and screening for cancer for at least 20 years after your treatment. If Pap tests have been discontinued, your risk factors (such as having a new sexual partner) need to be reassessed to determine if screening should be resumed. Some women have medical problems that increase the chance of getting cervical cancer. In these cases, your health care provider may recommend more frequent screening and Pap tests.  The HPV test is an additional test that may be used for cervical cancer screening. The HPV test looks for the virus that can cause the cell changes on the cervix. The cells collected during the Pap test can be  tested for HPV. The HPV test could be used to screen women aged 30 years and older, and should be used in women of any age who have unclear Pap test results. After the age of 30, women should have HPV testing at the same frequency as a Pap test.  Colorectal cancer can be detected and often prevented. Most routine colorectal cancer screening begins at the age of 50 years and continues through age 75 years. However, your health care provider may recommend screening at an earlier age if you have risk factors for colon cancer. On a yearly basis, your health care provider may provide home test kits to check for hidden blood in the stool. Use of a small camera at the end of a tube, to directly examine the colon (sigmoidoscopy or colonoscopy), can detect the earliest forms of colorectal cancer. Talk to your health care provider about this at age 50, when routine screening begins. Direct  exam of the colon should be repeated every 5-10 years through age 75 years, unless early forms of pre-cancerous polyps or small growths are found.  People who are at an increased risk for hepatitis B should be screened for this virus. You are considered at high risk for hepatitis B if:  You were born in a country where hepatitis B occurs often. Talk with your health care provider about which countries are considered high risk.  Your parents were born in a high-risk country and you have not received a shot to protect against hepatitis B (hepatitis B vaccine).  You have HIV or AIDS.  You use needles to inject street drugs.  You live with, or have sex with, someone who has hepatitis B.  You get hemodialysis treatment.  You take certain medicines for conditions like cancer, organ transplantation, and autoimmune conditions.  Hepatitis C blood testing is recommended for all people born from 1945 through 1965 and any individual with known risks for hepatitis C.  Practice safe sex. Use condoms and avoid high-risk sexual practices to reduce the spread of sexually transmitted infections (STIs). STIs include gonorrhea, chlamydia, syphilis, trichomonas, herpes, HPV, and human immunodeficiency virus (HIV). Herpes, HIV, and HPV are viral illnesses that have no cure. They can result in disability, cancer, and death.  You should be screened for sexually transmitted illnesses (STIs) including gonorrhea and chlamydia if:  You are sexually active and are younger than 24 years.  You are older than 24 years and your health care provider tells you that you are at risk for this type of infection.  Your sexual activity has changed since you were last screened and you are at an increased risk for chlamydia or gonorrhea. Ask your health care provider if you are at risk.  If you are at risk of being infected with HIV, it is recommended that you take a prescription medicine daily to prevent HIV infection. This is  called preexposure prophylaxis (PrEP). You are considered at risk if:  You are a heterosexual woman, are sexually active, and are at increased risk for HIV infection.  You take drugs by injection.  You are sexually active with a partner who has HIV.  Talk with your health care provider about whether you are at high risk of being infected with HIV. If you choose to begin PrEP, you should first be tested for HIV. You should then be tested every 3 months for as long as you are taking PrEP.  Osteoporosis is a disease in which the bones lose minerals and strength   with aging. This can result in serious bone fractures or breaks. The risk of osteoporosis can be identified using a bone density scan. Women ages 65 years and over and women at risk for fractures or osteoporosis should discuss screening with their health care providers. Ask your health care provider whether you should take a calcium supplement or vitamin D to reduce the rate of osteoporosis.  Menopause can be associated with physical symptoms and risks. Hormone replacement therapy is available to decrease symptoms and risks. You should talk to your health care provider about whether hormone replacement therapy is right for you.  Use sunscreen. Apply sunscreen liberally and repeatedly throughout the day. You should seek shade when your shadow is shorter than you. Protect yourself by wearing long sleeves, pants, a wide-brimmed hat, and sunglasses year round, whenever you are outdoors.  Once a month, do a whole body skin exam, using a mirror to look at the skin on your back. Tell your health care provider of new moles, moles that have irregular borders, moles that are larger than a pencil eraser, or moles that have changed in shape or color.  Stay current with required vaccines (immunizations).  Influenza vaccine. All adults should be immunized every year.  Tetanus, diphtheria, and acellular pertussis (Td, Tdap) vaccine. Pregnant women should  receive 1 dose of Tdap vaccine during each pregnancy. The dose should be obtained regardless of the length of time since the last dose. Immunization is preferred during the 27th-36th week of gestation. An adult who has not previously received Tdap or who does not know her vaccine status should receive 1 dose of Tdap. This initial dose should be followed by tetanus and diphtheria toxoids (Td) booster doses every 10 years. Adults with an unknown or incomplete history of completing a 3-dose immunization series with Td-containing vaccines should begin or complete a primary immunization series including a Tdap dose. Adults should receive a Td booster every 10 years.  Varicella vaccine. An adult without evidence of immunity to varicella should receive 2 doses or a second dose if she has previously received 1 dose. Pregnant females who do not have evidence of immunity should receive the first dose after pregnancy. This first dose should be obtained before leaving the health care facility. The second dose should be obtained 4-8 weeks after the first dose.  Human papillomavirus (HPV) vaccine. Females aged 13-26 years who have not received the vaccine previously should obtain the 3-dose series. The vaccine is not recommended for use in pregnant females. However, pregnancy testing is not needed before receiving a dose. If a female is found to be pregnant after receiving a dose, no treatment is needed. In that case, the remaining doses should be delayed until after the pregnancy. Immunization is recommended for any person with an immunocompromised condition through the age of 26 years if she did not get any or all doses earlier. During the 3-dose series, the second dose should be obtained 4-8 weeks after the first dose. The third dose should be obtained 24 weeks after the first dose and 16 weeks after the second dose.  Zoster vaccine. One dose is recommended for adults aged 60 years or older unless certain conditions are  present.  Measles, mumps, and rubella (MMR) vaccine. Adults born before 1957 generally are considered immune to measles and mumps. Adults born in 1957 or later should have 1 or more doses of MMR vaccine unless there is a contraindication to the vaccine or there is laboratory evidence of immunity to   each of the three diseases. A routine second dose of MMR vaccine should be obtained at least 28 days after the first dose for students attending postsecondary schools, health care workers, or international travelers. People who received inactivated measles vaccine or an unknown type of measles vaccine during 1963-1967 should receive 2 doses of MMR vaccine. People who received inactivated mumps vaccine or an unknown type of mumps vaccine before 1979 and are at high risk for mumps infection should consider immunization with 2 doses of MMR vaccine. For females of childbearing age, rubella immunity should be determined. If there is no evidence of immunity, females who are not pregnant should be vaccinated. If there is no evidence of immunity, females who are pregnant should delay immunization until after pregnancy. Unvaccinated health care workers born before 1957 who lack laboratory evidence of measles, mumps, or rubella immunity or laboratory confirmation of disease should consider measles and mumps immunization with 2 doses of MMR vaccine or rubella immunization with 1 dose of MMR vaccine.  Pneumococcal 13-valent conjugate (PCV13) vaccine. When indicated, a person who is uncertain of her immunization history and has no record of immunization should receive the PCV13 vaccine. An adult aged 19 years or older who has certain medical conditions and has not been previously immunized should receive 1 dose of PCV13 vaccine. This PCV13 should be followed with a dose of pneumococcal polysaccharide (PPSV23) vaccine. The PPSV23 vaccine dose should be obtained at least 8 weeks after the dose of PCV13 vaccine. An adult aged 19  years or older who has certain medical conditions and previously received 1 or more doses of PPSV23 vaccine should receive 1 dose of PCV13. The PCV13 vaccine dose should be obtained 1 or more years after the last PPSV23 vaccine dose.  Pneumococcal polysaccharide (PPSV23) vaccine. When PCV13 is also indicated, PCV13 should be obtained first. All adults aged 65 years and older should be immunized. An adult younger than age 65 years who has certain medical conditions should be immunized. Any person who resides in a nursing home or long-term care facility should be immunized. An adult smoker should be immunized. People with an immunocompromised condition and certain other conditions should receive both PCV13 and PPSV23 vaccines. People with human immunodeficiency virus (HIV) infection should be immunized as soon as possible after diagnosis. Immunization during chemotherapy or radiation therapy should be avoided. Routine use of PPSV23 vaccine is not recommended for American Indians, [place] Natives, or people younger than 65 years unless there are medical conditions that require PPSV23 vaccine. When indicated, people who have unknown immunization and have no record of immunization should receive PPSV23 vaccine. One-time revaccination 5 years after the first dose of PPSV23 is recommended for people aged 19-64 years who have chronic kidney failure, nephrotic syndrome, asplenia, or immunocompromised conditions. People who received 1-2 doses of PPSV23 before age 65 years should receive another dose of PPSV23 vaccine at age 65 years or later if at least 5 years have passed since the previous dose. Doses of PPSV23 are not needed for people immunized with PPSV23 at or after age 65 years.  Meningococcal vaccine. Adults with asplenia or persistent complement component deficiencies should receive 2 doses of quadrivalent meningococcal conjugate (MenACWY-D) vaccine. The doses should be obtained at least 2 months apart.  Microbiologists working with certain meningococcal bacteria, military recruits, people at risk during an outbreak, and people who travel to or live in countries with a high rate of meningitis should be immunized. A first-year college student up through age   21 years who is living in a residence hall should receive a dose if she did not receive a dose on or after her 16th birthday. Adults who have certain high-risk conditions should receive one or more doses of vaccine.  Hepatitis A vaccine. Adults who wish to be protected from this disease, have certain high-risk conditions, work with hepatitis A-infected animals, work in hepatitis A research labs, or travel to or work in countries with a high rate of hepatitis A should be immunized. Adults who were previously unvaccinated and who anticipate close contact with an international adoptee during the first 60 days after arrival in the [place] from a country with a high rate of hepatitis A should be immunized.  Hepatitis B vaccine. Adults who wish to be protected from this disease, have certain high-risk conditions, may be exposed to blood or other infectious body fluids, are household contacts or sex partners of hepatitis B positive people, are clients or workers in certain care facilities, or travel to or work in countries with a high rate of hepatitis B should be immunized.  Haemophilus influenzae type b (Hib) vaccine. A previously unvaccinated person with asplenia or sickle cell disease or having a scheduled splenectomy should receive 1 dose of Hib vaccine. Regardless of previous immunization, a recipient of a hematopoietic stem cell transplant should receive a 3-dose series 6-12 months after her successful transplant. Hib vaccine is not recommended for adults with HIV infection. Preventive Services / Frequency Ages 64 to 68 years  Blood pressure check.** / Every 1 to 2 years.  Lipid and cholesterol check.** / Every 5 years beginning at age  22.  Clinical breast exam.** / Every 3 years for women in their 88s and 53s.  BRCA-related cancer risk assessment.** / For women who have family members with a BRCA-related cancer (breast, ovarian, tubal, or peritoneal cancers).  Pap test.** / Every 2 years from ages 90 through 51. Every 3 years starting at age 21 through age 56 or 3 with a history of 3 consecutive normal Pap tests.  HPV screening.** / Every 3 years from ages 24 through ages 1 to 46 with a history of 3 consecutive normal Pap tests.  Hepatitis C blood test.** / For any individual with known risks for hepatitis C.  Skin self-exam. / Monthly.  Influenza vaccine. / Every year.  Tetanus, diphtheria, and acellular pertussis (Tdap, Td) vaccine.** / Consult your health care provider. Pregnant women should receive 1 dose of Tdap vaccine during each pregnancy. 1 dose of Td every 10 years.  Varicella vaccine.** / Consult your health care provider. Pregnant females who do not have evidence of immunity should receive the first dose after pregnancy.  HPV vaccine. / 3 doses over 6 months, if 72 and younger. The vaccine is not recommended for use in pregnant females. However, pregnancy testing is not needed before receiving a dose.  Measles, mumps, rubella (MMR) vaccine.** / You need at least 1 dose of MMR if you were born in 1957 or later. You may also need a 2nd dose. For females of childbearing age, rubella immunity should be determined. If there is no evidence of immunity, females who are not pregnant should be vaccinated. If there is no evidence of immunity, females who are pregnant should delay immunization until after pregnancy.  Pneumococcal 13-valent conjugate (PCV13) vaccine.** / Consult your health care provider.  Pneumococcal polysaccharide (PPSV23) vaccine.** / 1 to 2 doses if you smoke cigarettes or if you have certain conditions.  Meningococcal vaccine.** /  1 dose if you are age 19 to 21 years and a first-year college  student living in a residence hall, or have one of several medical conditions, you need to get vaccinated against meningococcal disease. You may also need additional booster doses.  Hepatitis A vaccine.** / Consult your health care provider.  Hepatitis B vaccine.** / Consult your health care provider.  Haemophilus influenzae type b (Hib) vaccine.** / Consult your health care provider. Ages 40 to 64 years  Blood pressure check.** / Every 1 to 2 years.  Lipid and cholesterol check.** / Every 5 years beginning at age 20 years.  Lung cancer screening. / Every year if you are aged 55-80 years and have a 30-pack-year history of smoking and currently smoke or have quit within the past 15 years. Yearly screening is stopped once you have quit smoking for at least 15 years or develop a health problem that would prevent you from having lung cancer treatment.  Clinical breast exam.** / Every year after age 40 years.  BRCA-related cancer risk assessment.** / For women who have family members with a BRCA-related cancer (breast, ovarian, tubal, or peritoneal cancers).  Mammogram.** / Every year beginning at age 40 years and continuing for as long as you are in good health. Consult with your health care provider.  Pap test.** / Every 3 years starting at age 30 years through age 65 or 70 years with a history of 3 consecutive normal Pap tests.  HPV screening.** / Every 3 years from ages 30 years through ages 65 to 70 years with a history of 3 consecutive normal Pap tests.  Fecal occult blood test (FOBT) of stool. / Every year beginning at age 50 years and continuing until age 75 years. You may not need to do this test if you get a colonoscopy every 10 years.  Flexible sigmoidoscopy or colonoscopy.** / Every 5 years for a flexible sigmoidoscopy or every 10 years for a colonoscopy beginning at age 50 years and continuing until age 75 years.  Hepatitis C blood test.** / For all people born from 1945 through  1965 and any individual with known risks for hepatitis C.  Skin self-exam. / Monthly.  Influenza vaccine. / Every year.  Tetanus, diphtheria, and acellular pertussis (Tdap/Td) vaccine.** / Consult your health care provider. Pregnant women should receive 1 dose of Tdap vaccine during each pregnancy. 1 dose of Td every 10 years.  Varicella vaccine.** / Consult your health care provider. Pregnant females who do not have evidence of immunity should receive the first dose after pregnancy.  Zoster vaccine.** / 1 dose for adults aged 60 years or older.  Measles, mumps, rubella (MMR) vaccine.** / You need at least 1 dose of MMR if you were born in 1957 or later. You may also need a 2nd dose. For females of childbearing age, rubella immunity should be determined. If there is no evidence of immunity, females who are not pregnant should be vaccinated. If there is no evidence of immunity, females who are pregnant should delay immunization until after pregnancy.  Pneumococcal 13-valent conjugate (PCV13) vaccine.** / Consult your health care provider.  Pneumococcal polysaccharide (PPSV23) vaccine.** / 1 to 2 doses if you smoke cigarettes or if you have certain conditions.  Meningococcal vaccine.** / Consult your health care provider.  Hepatitis A vaccine.** / Consult your health care provider.  Hepatitis B vaccine.** / Consult your health care provider.  Haemophilus influenzae type b (Hib) vaccine.** / Consult your health care provider. Ages 65   years and over  Blood pressure check.** / Every 1 to 2 years.  Lipid and cholesterol check.** / Every 5 years beginning at age 22 years.  Lung cancer screening. / Every year if you are aged 73-80 years and have a 30-pack-year history of smoking and currently smoke or have quit within the past 15 years. Yearly screening is stopped once you have quit smoking for at least 15 years or develop a health problem that would prevent you from having lung cancer  treatment.  Clinical breast exam.** / Every year after age 4 years.  BRCA-related cancer risk assessment.** / For women who have family members with a BRCA-related cancer (breast, ovarian, tubal, or peritoneal cancers).  Mammogram.** / Every year beginning at age 40 years and continuing for as long as you are in good health. Consult with your health care provider.  Pap test.** / Every 3 years starting at age 9 years through age 34 or 91 years with 3 consecutive normal Pap tests. Testing can be stopped between 65 and 70 years with 3 consecutive normal Pap tests and no abnormal Pap or HPV tests in the past 10 years.  HPV screening.** / Every 3 years from ages 57 years through ages 64 or 45 years with a history of 3 consecutive normal Pap tests. Testing can be stopped between 65 and 70 years with 3 consecutive normal Pap tests and no abnormal Pap or HPV tests in the past 10 years.  Fecal occult blood test (FOBT) of stool. / Every year beginning at age 15 years and continuing until age 17 years. You may not need to do this test if you get a colonoscopy every 10 years.  Flexible sigmoidoscopy or colonoscopy.** / Every 5 years for a flexible sigmoidoscopy or every 10 years for a colonoscopy beginning at age 86 years and continuing until age 71 years.  Hepatitis C blood test.** / For all people born from [birth year] through 1966 and any individual with known risks for hepatitis C.  Osteoporosis screening.** / A one-time screening for women ages 83 years and over and women at risk for fractures or osteoporosis.  Skin self-exam. / Monthly.  Influenza vaccine. / Every year.  Tetanus, diphtheria, and acellular pertussis (Tdap/Td) vaccine.** / 1 dose of Td every 10 years.  Varicella vaccine.** / Consult your health care provider.  Zoster vaccine.** / 1 dose for adults aged 61 years or older.  Pneumococcal 13-valent conjugate (PCV13) vaccine.** / Consult your health care provider.  Pneumococcal  polysaccharide (PPSV23) vaccine.** / 1 dose for all adults aged 28 years and older.  Meningococcal vaccine.** / Consult your health care provider.  Hepatitis A vaccine.** / Consult your health care provider.  Hepatitis B vaccine.** / Consult your health care provider.  Haemophilus influenzae type b (Hib) vaccine.** / Consult your health care provider. ** Family history and personal history of risk and conditions may change your health care provider's recommendations. Document Released: 10/16/2001 Document Revised: 01/04/2014 Document Reviewed: 01/15/2011 [facility] Patient Information 2015 [place], [place]. This information is not intended to replace advice given to you by your health care provider. Make sure you discuss any questions you have with your health care provider.

## 2014-10-03 NOTE — Assessment & Plan Note (Addendum)
Doing well on Escitalopram, continue but increase since it is not working as well as it did in the beginning

## 2014-10-03 NOTE — Assessment & Plan Note (Signed)
No recent flares, continue Albuterol prn

## 2014-10-03 NOTE — Assessment & Plan Note (Signed)
Encouraged heart healthy diet, increase exercise, avoid trans fats, consider a krill oil cap daily, recheck lipids next year

## 2014-10-03 NOTE — Assessment & Plan Note (Signed)
Patient encouraged to maintain heart healthy diet, regular exercise, adequate sleep. Consider daily probiotics. Take medications as prescribed 

## 2014-10-03 NOTE — Assessment & Plan Note (Signed)
Encouraged DASH diet, decrease po intake and increase exercise as tolerated. Needs 7-8 hours of sleep nightly. Avoid trans fats, eat small, frequent meals every 4-5 hours with lean proteins, complex carbs and healthy fats. Minimize simple carbs 

## 2014-12-09 ENCOUNTER — Other Ambulatory Visit: Payer: Self-pay | Admitting: Family Medicine

## 2014-12-24 ENCOUNTER — Other Ambulatory Visit: Payer: Self-pay | Admitting: Family Medicine

## 2014-12-27 ENCOUNTER — Other Ambulatory Visit: Payer: Self-pay | Admitting: Family Medicine

## 2015-02-25 ENCOUNTER — Other Ambulatory Visit: Payer: Self-pay | Admitting: Family Medicine

## 2015-03-10 ENCOUNTER — Other Ambulatory Visit: Payer: Self-pay | Admitting: Family Medicine

## 2015-03-10 DIAGNOSIS — F419 Anxiety disorder, unspecified: Principal | ICD-10-CM

## 2015-03-10 DIAGNOSIS — F329 Major depressive disorder, single episode, unspecified: Secondary | ICD-10-CM

## 2015-03-10 MED ORDER — ALPRAZOLAM 0.5 MG PO TABS: 0.5000 mg | ORAL_TABLET | Freq: Two times a day (BID) | ORAL | Status: DC | PRN

## 2015-03-10 NOTE — Telephone Encounter (Signed)
She can have one refill on the Alprazolam but she has not been seen since Jan with no follow up. Needs appt every 6 months to continue this med. Same sig, same strength, same number

## 2015-03-10 NOTE — Telephone Encounter (Signed)
Requesting:  ALPRAZOLAM Contract   NONE UDS   NONE Last OV   09/30/14 Last Refill   #40 WITH 2 REFILLS ON 09/30/14  Please Advise

## 2015-03-10 NOTE — Telephone Encounter (Signed)
Faxed hardcopy to [facility] [place] and patient informed to schedule appt.

## 2015-03-29 ENCOUNTER — Other Ambulatory Visit: Payer: Self-pay | Admitting: Family Medicine

## 2015-03-31 ENCOUNTER — Ambulatory Visit: Payer: BLUE CROSS/BLUE SHIELD | Admitting: Family Medicine

## 2015-04-04 ENCOUNTER — Ambulatory Visit: Payer: BLUE CROSS/BLUE SHIELD | Admitting: Family Medicine

## 2015-04-11 ENCOUNTER — Ambulatory Visit: Payer: BLUE CROSS/BLUE SHIELD | Admitting: Family Medicine

## 2015-04-21 ENCOUNTER — Other Ambulatory Visit: Payer: Self-pay | Admitting: Family Medicine

## 2015-04-21 ENCOUNTER — Telehealth: Payer: Self-pay | Admitting: Family Medicine

## 2015-04-21 DIAGNOSIS — F419 Anxiety disorder, unspecified: Principal | ICD-10-CM

## 2015-04-21 DIAGNOSIS — F329 Major depressive disorder, single episode, unspecified: Secondary | ICD-10-CM

## 2015-04-21 MED ORDER — BUPROPION HCL ER (XL) 150 MG PO TB24: 150.0000 mg | ORAL_TABLET | Freq: Every day | ORAL | Status: DC

## 2015-04-21 MED ORDER — ESCITALOPRAM OXALATE 20 MG PO TABS: ORAL_TABLET | ORAL | Status: DC

## 2015-04-21 MED ORDER — MONTELUKAST SODIUM 10 MG PO TABS: 10.0000 mg | ORAL_TABLET | Freq: Every evening | ORAL | Status: DC | PRN

## 2015-04-21 MED ORDER — ALPRAZOLAM 0.5 MG PO TABS: 0.5000 mg | ORAL_TABLET | Freq: Two times a day (BID) | ORAL | Status: DC | PRN

## 2015-04-21 NOTE — Telephone Encounter (Signed)
Advise please.

## 2015-04-21 NOTE — Telephone Encounter (Signed)
She can have a refill on her meds for 90 supply due to her impending loss of insurance. But will still need that appt. What is the last day she will have insurance coverage?

## 2015-04-21 NOTE — Telephone Encounter (Signed)
Advise on these refills is already phone note forwarded to you.

## 2015-04-21 NOTE — Telephone Encounter (Signed)
Patient has insurance until the end of the summer. The mom did state insurance will not pay for #90 day refills, but pharmacy is filling for #30.

## 2015-04-21 NOTE — Telephone Encounter (Signed)
Faxed hardcopy for [facility] [place] [place]

## 2015-04-21 NOTE — Telephone Encounter (Signed)
Message received already.

## 2015-04-21 NOTE — Telephone Encounter (Signed)
Pt mom called back. She wanted to thank all of [place] for being so helpful with the medication request this morning. Unfortunatey, the insurance denied 90 day supply of meds. She said not to send in another RX. Pt will get it when she comes 05/02/15.

## 2015-04-21 NOTE — Telephone Encounter (Signed)
Caller name: [name] Relationship to patient: mother Can be reached: [phone] [facility] [place]  Reason for call: Pts mom, Cynthia Salazar, is at [facility] [place] now. I confirmed that [facility] 1 month supply of meds filled 03/29/15 (lexapro, Wellbutrin, & Singular) that had not been opened (full supply still in each bottle). Pt is about to lose her insurance, they are hoping it will still be effective 05/02/15 for her appt. In the meantime they would like a 90 day supply of meds sent to [facility] [place] asap so that pt has them until she is able to (hopefully) obtain new insurance.  She is requesting a refill on Xanax as well if possible since she may not have insurance when she come 05/02/15. She has 10 left and takes them prn. She is aware of note 03/10/15 that pt needed appt before further fills. She said they'll figure it out if not possible but they would really appreciate a 90 day supply on the other meds since they are pretty expensive.   Please notify Cynthia Salazar when/if RX's are sent in to [facility] [place] (again wanting 90 day supply and void previous RX for 1 month supply).

## 2015-05-02 ENCOUNTER — Encounter: Payer: Self-pay | Admitting: Family Medicine

## 2015-05-02 ENCOUNTER — Ambulatory Visit (INDEPENDENT_AMBULATORY_CARE_PROVIDER_SITE_OTHER): Payer: 59 | Admitting: Family Medicine

## 2015-05-02 VITALS — BP 160/96 | HR 111 | Temp 99.3°F | Ht 61.0 in | Wt 161.0 lb

## 2015-05-02 DIAGNOSIS — J4520 Mild intermittent asthma, uncomplicated: Secondary | ICD-10-CM | POA: Diagnosis not present

## 2015-05-02 DIAGNOSIS — E663 Overweight: Secondary | ICD-10-CM | POA: Diagnosis not present

## 2015-05-02 DIAGNOSIS — E782 Mixed hyperlipidemia: Secondary | ICD-10-CM | POA: Diagnosis not present

## 2015-05-02 MED ORDER — METOPROLOL TARTRATE 25 MG PO TABS: 25.0000 mg | ORAL_TABLET | Freq: Two times a day (BID) | ORAL | Status: DC

## 2015-05-02 NOTE — Progress Notes (Signed)
Pre visit review using our clinic review tool, if applicable. No additional management support is needed unless otherwise documented below in the visit note. 

## 2015-05-02 NOTE — Progress Notes (Signed)
Patient ID: Cynthia Salazar, female   DOB: November 15, 1990, 24 y.o.   MRN: [id number]   Subjective:    Patient ID: Cynthia Salazar, female    DOB: [birth year], 24 y.o.   MRN: [id number]  Chief Complaint  Patient presents with  . Follow-up    HPI Patient is in today for follow-up. Generally doing better.No recent illness or acute concerns. Denies CP/palp/SOB/HA/congestion/fevers/GI or GU c/o. Taking meds as prescribed  Past Medical History  Diagnosis Date  . Chicken pox as a child  . Depression 34 yrs old  . Preventative health care 05/28/2012  . Asthma   . Allergy   . Asthma 05/29/2012  . Insomnia 03/17/2013  . Other and unspecified hyperlipidemia 03/18/2013  . Abnormal liver function test 03/18/2013  . Asthma 05/29/2012  . Depression with anxiety   . Overweight 04/05/2014    Past Surgical History  Procedure Laterality Date  . Wisdom tooth extraction  19 yrs    Family History  Problem Relation Age of Onset  . Nephrolithiasis Father   . Nephrolithiasis Brother   . COPD Paternal Grandmother     Social History   Social History  . Marital Status: Single    Spouse Name: N/A  . Number of Children: N/A  . Years of Education: N/A   Occupational History  . Not on file.   Social History Main Topics  . Smoking status: Former Games developer  . Smokeless tobacco: Never Used     Comment: occasionally smoked one  . Alcohol Use: Yes     Comment: occasionally  . Drug Use: No  . Sexual Activity:    Partners: Female   Other Topics Concern  . Not on file   Social History Narrative    Outpatient Prescriptions Prior to Visit  Medication Sig Dispense Refill  . ALPRAZolam (XANAX) 0.5 MG tablet Take 1 tablet (0.5 mg total) by mouth 2 (two) times daily as needed for anxiety or sleep. 60 tablet 0  . buPROPion (WELLBUTRIN XL) 150 MG 24 hr tablet Take 1 tablet (150 mg total) by mouth daily. 90 tablet 0  . cetirizine (ZYRTEC) 10 MG tablet TAKE 1 TABLET BY MOUTH DAILY AS NEEDED FOR ALLERGIES 30  tablet 4  . cyclobenzaprine (FLEXERIL) 10 MG tablet TAKE 1 TABLET BY MOUTH AT BEDTIME AS NEEDED FOR MUSCLE SPASMS 20 tablet 1  . escitalopram (LEXAPRO) 20 MG tablet TAKE 1 TABLET (20 MG TOTAL) BY MOUTH DAILY. 90 tablet 0  . montelukast (SINGULAIR) 10 MG tablet Take 1 tablet (10 mg total) by mouth at bedtime as needed. 90 tablet 0  . PROAIR HFA 108 (90 BASE) MCG/ACT inhaler INHALE 2 PUFFS INTO THE LUNGS EVERY 4 (FOUR) HOURS AS NEEDED. 8.5 each 1  . triamcinolone cream (KENALOG) 0.1 % Apply 1 application topically 2 (two) times daily as needed. dermatitis 45 g 1   No facility-administered medications prior to visit.    Allergies  Allergen Reactions  . Aleve [Naproxen Sodium]     wheeze    Review of Systems  Constitutional: Negative for fever and malaise/fatigue.  HENT: Negative for congestion.   Eyes: Negative for discharge.  Respiratory: Negative for shortness of breath.   Cardiovascular: Negative for chest pain, palpitations and leg swelling.  Gastrointestinal: Negative for nausea and abdominal pain.  Genitourinary: Negative for dysuria.  Musculoskeletal: Negative for falls.  Skin: Negative for rash.  Neurological: Negative for loss of consciousness and headaches.  Endo/Heme/Allergies: Negative for environmental allergies.  Psychiatric/Behavioral: Positive for depression. The patient  is nervous/anxious.        Objective:    Physical Exam  Constitutional: She is oriented to person, place, and time. She appears well-developed and well-nourished. No distress.  HENT:  Head: Normocephalic and atraumatic.  Nose: Nose normal.  Eyes: Right eye exhibits no discharge. Left eye exhibits no discharge.  Neck: Normal range of motion. Neck supple.  Cardiovascular: Normal rate and regular rhythm.   No murmur heard. Pulmonary/Chest: Effort normal and breath sounds normal.  Abdominal: Soft. Bowel sounds are normal. There is no tenderness.  Musculoskeletal: She exhibits no edema.    Neurological: She is alert and oriented to person, place, and time.  Skin: Skin is warm and dry.  Psychiatric: She has a normal mood and affect.  Nursing note and vitals reviewed.   BP 157/117 mmHg  Pulse 111  Temp(Src) 99.3 F (37.4 C) (Oral)  Ht 5\' 1"  (1.549 m)  Wt 161 lb (73.029 kg)  BMI 30.44 kg/m2  LMP 04/29/2015 Wt Readings from Last 3 Encounters:  05/02/15 161 lb (73.029 kg)  09/30/14 162 lb (73.483 kg)  04/01/14 161 lb (73.029 kg)     Lab Results  Component Value Date   WBC 10.4 03/17/2013   HGB 14.4 03/17/2013   HCT 43.7 03/17/2013   PLT 265 03/17/2013   GLUCOSE 75 03/17/2013   CHOL 248* 03/17/2013   TRIG 170* 03/17/2013   HDL 68 03/17/2013   LDLCALC 146* 03/17/2013   ALT 52* 03/17/2013   AST 33 03/17/2013   NA 139 03/17/2013   K 4.7 03/17/2013   CL 102 03/17/2013   CREATININE 0.97 03/17/2013   BUN 14 03/17/2013   CO2 27 03/17/2013   TSH 2.947 03/17/2013    Lab Results  Component Value Date   TSH 2.947 03/17/2013   Lab Results  Component Value Date   WBC 10.4 03/17/2013   HGB 14.4 03/17/2013   HCT 43.7 03/17/2013   MCV 91.0 03/17/2013   PLT 265 03/17/2013   Lab Results  Component Value Date   NA 139 03/17/2013   K 4.7 03/17/2013   CO2 27 03/17/2013   GLUCOSE 75 03/17/2013   BUN 14 03/17/2013   CREATININE 0.97 03/17/2013   BILITOT 0.3 03/17/2013   ALKPHOS 59 03/17/2013   AST 33 03/17/2013   ALT 52* 03/17/2013   PROT 7.4 03/17/2013   ALBUMIN 4.2 03/17/2013   ALBUMIN 4.2 03/17/2013   CALCIUM 9.5 03/17/2013   Lab Results  Component Value Date   CHOL 248* 03/17/2013   Lab Results  Component Value Date   HDL 68 03/17/2013   Lab Results  Component Value Date   LDLCALC 146* 03/17/2013   Lab Results  Component Value Date   TRIG 170* 03/17/2013   Lab Results  Component Value Date   CHOLHDL 3.6 03/17/2013   No results found for: HGBA1C     Assessment & Plan:  No problem-specific assessment & plan notes found for this  encounter. Overweight Encouraged DASH diet, decrease po intake and increase exercise as tolerated. Needs 7-8 hours of sleep nightly. Avoid trans fats, eat small, frequent meals every 4-5 hours with lean proteins, complex carbs and healthy fats. Minimize simple carbs, GMO foods.  Hyperlipidemia, mixed Encouraged heart healthy diet, increase exercise, avoid trans fats, consider a krill oil cap daily  Mild intermittent asthma No recent flares continue current meds.   I am having Ms. Mermelstein maintain her PROAIR HFA, triamcinolone cream, cetirizine, cyclobenzaprine, ALPRAZolam, buPROPion, escitalopram, and montelukast.  No  orders of the defined types were placed in this encounter.     Baird Kay, LPN

## 2015-05-02 NOTE — Patient Instructions (Signed)

## 2015-05-05 ENCOUNTER — Telehealth: Payer: Self-pay | Admitting: Family Medicine

## 2015-05-05 ENCOUNTER — Ambulatory Visit: Payer: Self-pay | Admitting: Medical

## 2015-05-05 DIAGNOSIS — R030 Elevated blood-pressure reading, without diagnosis of hypertension: Principal | ICD-10-CM

## 2015-05-05 DIAGNOSIS — IMO0001 Reserved for inherently not codable concepts without codable children: Secondary | ICD-10-CM

## 2015-05-05 NOTE — Telephone Encounter (Signed)
Provider's recommendations discussed.  Mother agreed.  Appointment for nurse visit and lab schedule.  Future labs ordered.

## 2015-05-05 NOTE — Telephone Encounter (Signed)
Patient Name: Cynthia Salazar DOB: May 02, 1991 Initial Comment Caller states dtr was seen on Mon regarding bp, dr put her on Metoprolol 144/118, 152/122, 146/113, 131/100 Nurse Assessment Nurse: Yetta Barre, RN, Miranda Date/Time (Eastern Time): 05/05/2015 8:22:27 AM Confirm and document reason for call. If symptomatic, describe symptoms. ---Caller states her daughter was seen on Monday for a med check, but they found that her BP was high and started on medications. They have been checking her BP twice a day, 144/118, 152/122, 146/113, 131/100 (this is most recent BP). No symptoms. Has the patient traveled out of the country within the last 30 days? ---Not Applicable Does the patient require triage? ---Yes Related visit to physician within the last 2 weeks? ---Yes Does the PT have any chronic conditions? (i.e. diabetes, asthma, etc.) ---Yes List chronic conditions. ---Allergies, Asthma, Depression Did the patient indicate they were pregnant? ---No Guidelines Guideline Title Affirmed Question Affirmed Notes High Blood Pressure BP # 160/100 Final Disposition User See PCP When Office is Open (within 3 days) Yetta Barre, RN, Miranda Comments Appt scheduled for 2:45 pm today with Esperanza Richters PA. Caller and pt are concerned about finances because the pt lost her job and insurance (last day of insurance coverage was yesterday). They asked for MD to review and if anything can be done without her coming they would appreciate that, but if not they will work something out. Please contact caller, Beckie Salts, at [phone]. Disagree/Comply: Comply

## 2015-05-05 NOTE — Telephone Encounter (Signed)
Have her double her metoprolol by taking 2 tabs and come in for bp pulse check next week. She also really needs lab work since it is staying hi, needs CMP, CBC and TSH

## 2015-05-05 NOTE — Telephone Encounter (Signed)
Please advise 

## 2015-05-10 ENCOUNTER — Other Ambulatory Visit: Payer: Self-pay | Admitting: Family Medicine

## 2015-05-10 ENCOUNTER — Other Ambulatory Visit (INDEPENDENT_AMBULATORY_CARE_PROVIDER_SITE_OTHER): Payer: BLUE CROSS/BLUE SHIELD

## 2015-05-10 ENCOUNTER — Ambulatory Visit: Payer: BLUE CROSS/BLUE SHIELD

## 2015-05-10 VITALS — BP 150/120 | HR 65

## 2015-05-10 DIAGNOSIS — R030 Elevated blood-pressure reading, without diagnosis of hypertension: Secondary | ICD-10-CM | POA: Diagnosis not present

## 2015-05-10 DIAGNOSIS — E0590 Thyrotoxicosis, unspecified without thyrotoxic crisis or storm: Secondary | ICD-10-CM

## 2015-05-10 DIAGNOSIS — E876 Hypokalemia: Secondary | ICD-10-CM

## 2015-05-10 DIAGNOSIS — IMO0001 Reserved for inherently not codable concepts without codable children: Secondary | ICD-10-CM

## 2015-05-10 DIAGNOSIS — I10 Essential (primary) hypertension: Secondary | ICD-10-CM

## 2015-05-10 LAB — COMPREHENSIVE METABOLIC PANEL
ALT: 56 U/L — ABNORMAL HIGH (ref 0–35)
AST: 50 U/L — ABNORMAL HIGH (ref 0–37)
Albumin: 4.3 g/dL (ref 3.5–5.2)
Alkaline Phosphatase: 62 U/L (ref 39–117)
BUN: 9 mg/dL (ref 6–23)
CHLORIDE: 99 meq/L (ref 96–112)
CO2: 27 meq/L (ref 19–32)
Calcium: 9.4 mg/dL (ref 8.4–10.5)
Creatinine, Ser: 0.89 mg/dL (ref 0.40–1.20)
GFR: 81.84 mL/min (ref >60.00)
GLUCOSE: 91 mg/dL (ref 70–99)
POTASSIUM: 3.3 meq/L — AB (ref 3.5–5.1)
SODIUM: 137 meq/L (ref 135–145)
TOTAL PROTEIN: 7.6 g/dL (ref 6.0–8.3)
Total Bilirubin: 0.7 mg/dL (ref 0.2–1.2)

## 2015-05-10 LAB — CBC
HEMATOCRIT: 43.3 % (ref 36.0–46.0)
HEMOGLOBIN: 14.8 g/dL (ref 12.0–15.0)
MCHC: 34.1 g/dL (ref 30.0–36.0)
MCV: 95.9 fl (ref 78.0–100.0)
PLATELETS: 269 10*3/uL (ref 150.0–400.0)
RBC: 4.52 Mil/uL (ref 3.87–5.11)
RDW: 14.3 % (ref 11.5–15.5)
WBC: 8.2 10*3/uL (ref 4.0–10.5)

## 2015-05-10 LAB — T4, FREE: Free T4: 0.85 ng/dL (ref 0.60–1.60)

## 2015-05-10 LAB — TSH: TSH: 8.36 u[IU]/mL — ABNORMAL HIGH (ref 0.35–4.50)

## 2015-05-10 LAB — T3, FREE: T3, Free: 3.9 pg/mL (ref 2.3–4.2)

## 2015-05-10 MED ORDER — METOPROLOL TARTRATE 100 MG PO TABS: 100.0000 mg | ORAL_TABLET | Freq: Two times a day (BID) | ORAL | Status: DC

## 2015-05-10 NOTE — Progress Notes (Signed)
Pre visit review using our clinic review tool, if applicable. No additional management support is needed unless otherwise documented below in the visit note. 

## 2015-05-10 NOTE — Patient Instructions (Signed)
Patient to start Metoprolol Tartrate  twice daily and return in 1 week for BP, Pulse check.

## 2015-05-10 NOTE — Progress Notes (Signed)
Pre visit review using our clinic review tool, if applicable. No additional management support is needed unless otherwise documented below in the visit note.  Patient in for BP and Pulse check today. BP= 150/120 Pulse = 65.

## 2015-05-10 NOTE — Patient Instructions (Signed)
Start Metoprolol Tartrate  twice daily and return for BP check in 1 week. Appointment scheduled for Monday September 19,2016

## 2015-05-15 NOTE — Assessment & Plan Note (Signed)
Encouraged heart healthy diet, increase exercise, avoid trans fats, consider a krill oil cap daily 

## 2015-05-15 NOTE — Assessment & Plan Note (Signed)
No recent flares continue current meds.

## 2015-05-15 NOTE — Assessment & Plan Note (Signed)
Encouraged DASH diet, decrease po intake and increase exercise as tolerated. Needs 7-8 hours of sleep nightly. Avoid trans fats, eat small, frequent meals every 4-5 hours with lean proteins, complex carbs and healthy fats. Minimize simple carbs, GMO foods. 

## 2015-05-16 ENCOUNTER — Ambulatory Visit (INDEPENDENT_AMBULATORY_CARE_PROVIDER_SITE_OTHER): Payer: BLUE CROSS/BLUE SHIELD | Admitting: Family Medicine

## 2015-05-16 VITALS — BP 181/122 | HR 95

## 2015-05-16 DIAGNOSIS — Z136 Encounter for screening for cardiovascular disorders: Secondary | ICD-10-CM | POA: Diagnosis not present

## 2015-05-16 DIAGNOSIS — Z0130 Encounter for examination of blood pressure without abnormal findings: Secondary | ICD-10-CM

## 2015-05-16 NOTE — Progress Notes (Signed)
Patient asymptomatic but had not taken meds today. Asked to seek care if symptoms develop and to go home and take meds. Return tomorrow on BP meds. Must rest and minimize alcohol and sodium. Avoid caffeine til seen.

## 2015-05-16 NOTE — Progress Notes (Signed)
Pre visit review using our clinic review tool, if applicable. No additional management support is needed unless otherwise documented below in the visit note.  Patient presents today for a blood pressure check per Marylouise Stacks, LPN note below on 05/10/15:  Start Metoprolol Tartrate  twice daily and return for BP check in 1 week. Appointment scheduled for Monday September 19,2016   Readings are: B/P 168/132 P 103 & 181/122 P 95.  Per Dr. Abner Greenspan: Decrease alcohol consumption, avoid caffeine, take medication as prescribed and get 8 hours of sleep. Return on tomorrow, 05/17/15 to have B/P recheck and be sure medication  has been taken at least 1 hour before the visit. If patient notices any signs or symptoms of chest pain or headache prior to appointment tomorrow, go to the ER. Schedule for a follow-up appointment with provider in the next 1-2 weeks. Patient understood instructions and did not have any concerns before leaving the office.

## 2015-05-17 ENCOUNTER — Ambulatory Visit (INDEPENDENT_AMBULATORY_CARE_PROVIDER_SITE_OTHER): Payer: BLUE CROSS/BLUE SHIELD | Admitting: Family Medicine

## 2015-05-17 VITALS — BP 154/110 | HR 68

## 2015-05-17 DIAGNOSIS — R030 Elevated blood-pressure reading, without diagnosis of hypertension: Secondary | ICD-10-CM | POA: Diagnosis not present

## 2015-05-17 DIAGNOSIS — IMO0001 Reserved for inherently not codable concepts without codable children: Secondary | ICD-10-CM

## 2015-05-17 MED ORDER — LISINOPRIL 10 MG PO TABS: 10.0000 mg | ORAL_TABLET | Freq: Two times a day (BID) | ORAL | Status: DC

## 2015-05-17 NOTE — Progress Notes (Signed)
Called patient regarding lab results.

## 2015-05-17 NOTE — Progress Notes (Signed)
Pre visit review using our clinic review tool, if applicable. No additional management support is needed unless otherwise documented below in the visit note. 

## 2015-05-17 NOTE — Progress Notes (Addendum)
Pt came in for blood pressure check.  BP: Right arm 144/120.  BP: Left arm 148/120.  No acute distress noted.  She denies chest pain, shortness of breath, headache, blurred vision, dizziness, or weakness.  Pt states for the past 2 days she has been taking BP medication as prescribed.   Pt shared family history HTN-Father.  Family hx updated in chart.       BPs discussed with Dr. Abner Greenspan. Verbal orders given for patient to add Lisinopril 10 mg by mouth twice a day and follow up with Dr. Abner Greenspan next week.  Rx sent to pharmacy.  Appt scheduled.   I reviewed case and agree with plan\

## 2015-05-26 ENCOUNTER — Ambulatory Visit (INDEPENDENT_AMBULATORY_CARE_PROVIDER_SITE_OTHER): Payer: BLUE CROSS/BLUE SHIELD | Admitting: Family Medicine

## 2015-05-26 ENCOUNTER — Encounter: Payer: Self-pay | Admitting: Family Medicine

## 2015-05-26 VITALS — BP 138/92 | HR 75 | Temp 98.3°F | Ht 60.0 in | Wt 158.2 lb

## 2015-05-26 DIAGNOSIS — E876 Hypokalemia: Secondary | ICD-10-CM

## 2015-05-26 DIAGNOSIS — I10 Essential (primary) hypertension: Secondary | ICD-10-CM | POA: Diagnosis not present

## 2015-05-26 DIAGNOSIS — K7689 Other specified diseases of liver: Secondary | ICD-10-CM

## 2015-05-26 DIAGNOSIS — F418 Other specified anxiety disorders: Secondary | ICD-10-CM

## 2015-05-26 DIAGNOSIS — R7989 Other specified abnormal findings of blood chemistry: Secondary | ICD-10-CM

## 2015-05-26 DIAGNOSIS — F1090 Alcohol use, unspecified, uncomplicated: Secondary | ICD-10-CM

## 2015-05-26 DIAGNOSIS — Z789 Other specified health status: Secondary | ICD-10-CM

## 2015-05-26 DIAGNOSIS — F1099 Alcohol use, unspecified with unspecified alcohol-induced disorder: Secondary | ICD-10-CM

## 2015-05-26 DIAGNOSIS — Z7289 Other problems related to lifestyle: Secondary | ICD-10-CM

## 2015-05-26 DIAGNOSIS — R945 Abnormal results of liver function studies: Secondary | ICD-10-CM

## 2015-05-26 NOTE — Progress Notes (Signed)
Pre visit review using our clinic review tool, if applicable. No additional management support is needed unless otherwise documented below in the visit note. 

## 2015-05-26 NOTE — Patient Instructions (Signed)

## 2015-05-27 ENCOUNTER — Telehealth: Payer: Self-pay | Admitting: Family Medicine

## 2015-05-27 LAB — COMPREHENSIVE METABOLIC PANEL
ALK PHOS: 52 U/L (ref 39–117)
ALT: 51 U/L — AB (ref 0–35)
AST: 51 U/L — AB (ref 0–37)
Albumin: 4.3 g/dL (ref 3.5–5.2)
BILIRUBIN TOTAL: 0.4 mg/dL (ref 0.2–1.2)
BUN: 13 mg/dL (ref 6–23)
CALCIUM: 9.2 mg/dL (ref 8.4–10.5)
CO2: 22 meq/L (ref 19–32)
CREATININE: 1.1 mg/dL (ref 0.40–1.20)
Chloride: 104 mEq/L (ref 96–112)
GFR: 64.07 mL/min (ref >60.00)
Glucose, Bld: 81 mg/dL (ref 70–99)
Potassium: 4.1 mEq/L (ref 3.5–5.1)
Sodium: 139 mEq/L (ref 135–145)
TOTAL PROTEIN: 7.6 g/dL (ref 6.0–8.3)

## 2015-05-27 LAB — HEPATITIS PANEL, ACUTE
HCV Ab: NEGATIVE
Hep A IgM: NONREACTIVE
Hep B C IgM: NONREACTIVE
Hepatitis B Surface Ag: NEGATIVE

## 2015-05-27 LAB — MAGNESIUM: Magnesium: 2 mg/dL (ref 1.5–2.5)

## 2015-05-27 NOTE — Telephone Encounter (Signed)
Per mother, BP 148/108 after taking medications last night.  Mother-in-law checked BP today and stated it was 140's /105.  She took her BP medications, Zofran, and xanax 0.5 last night and today.  She has been doing better and her mother states that she has not had any alcohol for a few days since she was given an ultimatum from her parents.    Tiersa's #: [id number].  Called and left message on voicemail.

## 2015-05-27 NOTE — Telephone Encounter (Signed)
Caller name: KellyRelationship to patient:mother  Can be reached:[phone] [facility]acy:[facility] Reason for call: Patient woke mom up at 12am with a bp of 210/147.  Had not taken her pills  Mom gave her the pills.  Stayed up with her and it came down.  Shelia was vomiting, crying and had a headache

## 2015-05-27 NOTE — Telephone Encounter (Signed)
Please confirm that we have permission to talk to mom if so, see how patient is doing today and see if they are willing to get a new BP reading now to see how she is. If she gets bad again they may have to go to ER over weekend. I suspect she did not drink any alcohol after I saw her yesterday and she may have had some withdrawal. I can give them some Ativan to help if she is trying to stay off Alcohol. Lorazepam 0.5 mg tabs 1 tab po tid prn anxiety, hi blood pressure or alcohol withdrawal, disp #60. She just cannot mix with alcohol

## 2015-05-27 NOTE — Telephone Encounter (Signed)
noted 

## 2015-05-31 ENCOUNTER — Other Ambulatory Visit: Payer: BLUE CROSS/BLUE SHIELD

## 2015-06-03 ENCOUNTER — Encounter: Payer: Self-pay | Admitting: Family Medicine

## 2015-06-03 DIAGNOSIS — I10 Essential (primary) hypertension: Secondary | ICD-10-CM

## 2015-06-03 DIAGNOSIS — Z7289 Other problems related to lifestyle: Secondary | ICD-10-CM

## 2015-06-03 DIAGNOSIS — F1090 Alcohol use, unspecified, uncomplicated: Secondary | ICD-10-CM

## 2015-06-03 DIAGNOSIS — Z789 Other specified health status: Secondary | ICD-10-CM | POA: Insufficient documentation

## 2015-06-03 HISTORY — DX: Alcohol use, unspecified, uncomplicated: F10.90

## 2015-06-03 HISTORY — DX: Other problems related to lifestyle: Z72.89

## 2015-06-03 HISTORY — DX: Other specified health status: Z78.9

## 2015-06-03 HISTORY — DX: Essential (primary) hypertension: I10

## 2015-06-03 NOTE — Assessment & Plan Note (Signed)
She is struggling due to loss of job but does feel her meds are helping her get through. No changes for now, encouraged to consider counseling

## 2015-06-03 NOTE — Assessment & Plan Note (Signed)
Labile use, encouraged cessation. Has lost her license in past due to DUI

## 2015-06-03 NOTE — Assessment & Plan Note (Addendum)
Elevated and labile secondary to heavy alcohol consumption and stress. Continue Metoprolol and increase Lisinopril

## 2015-06-03 NOTE — Assessment & Plan Note (Signed)
Likely related to alcohol use. Encouraged cessation, minimize simple carbs

## 2015-06-03 NOTE — Progress Notes (Signed)
Subjective:    Patient ID: Cynthia Salazar, female    DOB: 04-22-1991, 24 y.o.   MRN: [id number]  Chief Complaint  Patient presents with  . Follow-up    blood pressure    HPI Patient is in today for follow-up on blood pressure. She reports she is taking her medications as prescribed. She acknowledges her drinking has been somewhat labile. She denies any acute complaints but acknowledges she continues to struggle with anhedonia and depression. She denies suicidal ideation. Denies CP/palp/SOB/HA/congestion/fevers/GI or GU c/o. Taking meds as prescribed  Past Medical History  Diagnosis Date  . Chicken pox as a child  . Depression 56 yrs old  . Preventative health care 05/28/2012  . Asthma   . Allergy   . Asthma 05/29/2012  . Insomnia 03/17/2013  . Other and unspecified hyperlipidemia 03/18/2013  . Abnormal liver function test 03/18/2013  . Asthma 05/29/2012  . Depression with anxiety   . Overweight 04/05/2014  . Essential hypertension 06/03/2015  . Alcohol use 06/03/2015    Past Surgical History  Procedure Laterality Date  . Wisdom tooth extraction  19 yrs    Family History  Problem Relation Age of Onset  . Nephrolithiasis Father   . Nephrolithiasis Brother   . COPD Paternal Grandmother   . Hypertension Father     Social History   Social History  . Marital Status: Single    Spouse Name: N/A  . Number of Children: N/A  . Years of Education: N/A   Occupational History  . Not on file.   Social History Main Topics  . Smoking status: Former Research scientist (life sciences)  . Smokeless tobacco: Never Used     Comment: occasionally smoked one  . Alcohol Use: Yes     Comment: occasionally  . Drug Use: No  . Sexual Activity:    Partners: Female   Other Topics Concern  . Not on file   Social History Narrative    Outpatient Prescriptions Prior to Visit  Medication Sig Dispense Refill  . ALPRAZolam (XANAX) 0.5 MG tablet Take 1 tablet (0.5 mg total) by mouth 2 (two) times daily as needed for  anxiety or sleep. 60 tablet 0  . buPROPion (WELLBUTRIN XL) 150 MG 24 hr tablet Take 1 tablet (150 mg total) by mouth daily. 90 tablet 0  . cetirizine (ZYRTEC) 10 MG tablet TAKE 1 TABLET BY MOUTH DAILY AS NEEDED FOR ALLERGIES 30 tablet 4  . escitalopram (LEXAPRO) 20 MG tablet TAKE 1 TABLET (20 MG TOTAL) BY MOUTH DAILY. 30 tablet 5  . lisinopril (PRINIVIL,ZESTRIL) 10 MG tablet Take 1 tablet (10 mg total) by mouth 2 (two) times daily. 60 tablet 1  . metoprolol (LOPRESSOR) 100 MG tablet Take 1 tablet (100 mg total) by mouth 2 (two) times daily. 60 tablet 1  . montelukast (SINGULAIR) 10 MG tablet Take 1 tablet (10 mg total) by mouth at bedtime as needed. 90 tablet 0  . PROAIR HFA 108 (90 BASE) MCG/ACT inhaler INHALE 2 PUFFS INTO THE LUNGS EVERY 4 (FOUR) HOURS AS NEEDED. 8.5 each 1  . triamcinolone cream (KENALOG) 0.1 % Apply 1 application topically 2 (two) times daily as needed. dermatitis 45 g 1  . cyclobenzaprine (FLEXERIL) 10 MG tablet TAKE 1 TABLET BY MOUTH AT BEDTIME AS NEEDED FOR MUSCLE SPASMS (Patient not taking: Reported on 05/26/2015) 20 tablet 1  . buPROPion (WELLBUTRIN XL) 150 MG 24 hr tablet TAKE 1 TABLET BY MOUTH EVERY DAY 30 tablet 5  . escitalopram (LEXAPRO) 20 MG tablet  TAKE 1 TABLET (20 MG TOTAL) BY MOUTH DAILY. 90 tablet 0   No facility-administered medications prior to visit.    Allergies  Allergen Reactions  . Aleve [Naproxen Sodium]     wheeze    Review of Systems  Constitutional: Negative for fever and malaise/fatigue.  HENT: Negative for congestion.   Eyes: Negative for discharge.  Respiratory: Negative for shortness of breath.   Cardiovascular: Negative for chest pain, palpitations and leg swelling.  Gastrointestinal: Negative for nausea and abdominal pain.  Genitourinary: Negative for dysuria.  Musculoskeletal: Negative for falls.  Skin: Negative for rash.  Neurological: Negative for loss of consciousness and headaches.  Endo/Heme/Allergies: Negative for  environmental allergies.  Psychiatric/Behavioral: Negative for depression. The patient is not nervous/anxious.        Objective:    Physical Exam  Constitutional: She is oriented to person, place, and time. She appears well-developed and well-nourished. No distress.  HENT:  Head: Normocephalic and atraumatic.  Nose: Nose normal.  Eyes: Right eye exhibits no discharge. Left eye exhibits no discharge.  Neck: Normal range of motion. Neck supple.  Cardiovascular: Normal rate and regular rhythm.   No murmur heard. Pulmonary/Chest: Effort normal and breath sounds normal.  Abdominal: Soft. Bowel sounds are normal. There is no tenderness.  Musculoskeletal: She exhibits no edema.  Neurological: She is alert and oriented to person, place, and time.  Skin: Skin is warm and dry.  Psychiatric: She has a normal mood and affect.  Nursing note and vitals reviewed.   BP 138/92 mmHg  Pulse 75  Temp(Src) 98.3 F (36.8 C) (Oral)  Ht 5' (1.524 m)  Wt 158 lb 4 oz (71.782 kg)  BMI 30.91 kg/m2  SpO2 96%  LMP 04/29/2015 Wt Readings from Last 3 Encounters:  05/26/15 158 lb 4 oz (71.782 kg)  05/02/15 161 lb (73.029 kg)  09/30/14 162 lb (73.483 kg)     Lab Results  Component Value Date   WBC 8.2 05/10/2015   HGB 14.8 05/10/2015   HCT 43.3 05/10/2015   PLT 269.0 05/10/2015   GLUCOSE 81 05/26/2015   CHOL 248* 03/17/2013   TRIG 170* 03/17/2013   HDL 68 03/17/2013   LDLCALC 146* 03/17/2013   ALT 51* 05/26/2015   AST 51* 05/26/2015   NA 139 05/26/2015   K 4.1 05/26/2015   CL 104 05/26/2015   CREATININE 1.10 05/26/2015   BUN 13 05/26/2015   CO2 22 05/26/2015   TSH 8.36* 05/10/2015    Lab Results  Component Value Date   TSH 8.36* 05/10/2015   Lab Results  Component Value Date   WBC 8.2 05/10/2015   HGB 14.8 05/10/2015   HCT 43.3 05/10/2015   MCV 95.9 05/10/2015   PLT 269.0 05/10/2015   Lab Results  Component Value Date   NA 139 05/26/2015   K 4.1 05/26/2015   CO2 22  05/26/2015   GLUCOSE 81 05/26/2015   BUN 13 05/26/2015   CREATININE 1.10 05/26/2015   BILITOT 0.4 05/26/2015   ALKPHOS 52 05/26/2015   AST 51* 05/26/2015   ALT 51* 05/26/2015   PROT 7.6 05/26/2015   ALBUMIN 4.3 05/26/2015   CALCIUM 9.2 05/26/2015   GFR 64.07 05/26/2015   Lab Results  Component Value Date   CHOL 248* 03/17/2013   Lab Results  Component Value Date   HDL 68 03/17/2013   Lab Results  Component Value Date   LDLCALC 146* 03/17/2013   Lab Results  Component Value Date   TRIG 170*  03/17/2013   Lab Results  Component Value Date   CHOLHDL 3.6 03/17/2013   No results found for: HGBA1C     Assessment & Plan:   Problem List Items Addressed This Visit    Essential hypertension    Elevated and labile secondary to heavy alcohol consumption and stress. Continue Metoprolol and increase Lisinopril      Relevant Orders   Hepatitis, Acute (Completed)   Depression with anxiety    She is struggling due to loss of job but does feel her meds are helping her get through. No changes for now, encouraged to consider counseling      Alcohol use    Labile use, encouraged cessation. Has lost her license in past due to DUI      Abnormal liver function test    Likely related to alcohol use. Encouraged cessation, minimize simple carbs       Other Visit Diagnoses    Abnormal liver function    -  Primary    Relevant Orders    Comp Met (CMET) (Completed)    Magnesium (Completed)    Hepatitis, Acute (Completed)    Hypokalemia        Relevant Orders    Comp Met (CMET) (Completed)    Magnesium (Completed)    Hepatitis, Acute (Completed)       I am having Ms. See maintain her PROAIR HFA, triamcinolone cream, cetirizine, cyclobenzaprine, ALPRAZolam, buPROPion, montelukast, metoprolol, escitalopram, and lisinopril.  No orders of the defined types were placed in this encounter.     Penni Homans, MD

## 2015-06-06 ENCOUNTER — Other Ambulatory Visit: Payer: Self-pay | Admitting: Family Medicine

## 2015-07-01 ENCOUNTER — Ambulatory Visit (INDEPENDENT_AMBULATORY_CARE_PROVIDER_SITE_OTHER): Payer: BLUE CROSS/BLUE SHIELD | Admitting: Family Medicine

## 2015-07-01 ENCOUNTER — Encounter: Payer: Self-pay | Admitting: Family Medicine

## 2015-07-01 ENCOUNTER — Telehealth: Payer: Self-pay | Admitting: Family Medicine

## 2015-07-01 VITALS — BP 130/84 | HR 59 | Temp 98.8°F | Ht 61.0 in | Wt 160.1 lb

## 2015-07-01 DIAGNOSIS — I10 Essential (primary) hypertension: Secondary | ICD-10-CM | POA: Diagnosis not present

## 2015-07-01 DIAGNOSIS — Z23 Encounter for immunization: Secondary | ICD-10-CM

## 2015-07-01 DIAGNOSIS — Z789 Other specified health status: Secondary | ICD-10-CM | POA: Diagnosis not present

## 2015-07-01 DIAGNOSIS — F1010 Alcohol abuse, uncomplicated: Secondary | ICD-10-CM

## 2015-07-01 DIAGNOSIS — F418 Other specified anxiety disorders: Secondary | ICD-10-CM

## 2015-07-01 DIAGNOSIS — Z7289 Other problems related to lifestyle: Secondary | ICD-10-CM

## 2015-07-01 NOTE — Telephone Encounter (Signed)
Relation to MV:HQIOpt:self Call back number:[phone] Pharmacy:  Reason for call:  Patient states as per conversation [facility] [phone]. Patient states PCP is aware of what she is referring too.

## 2015-07-01 NOTE — Patient Instructions (Signed)

## 2015-07-01 NOTE — Progress Notes (Signed)
Pre visit review using our clinic review tool, if applicable. No additional management support is needed unless otherwise documented below in the visit note. 

## 2015-07-04 ENCOUNTER — Encounter: Payer: Self-pay | Admitting: Family Medicine

## 2015-07-04 ENCOUNTER — Telehealth: Payer: Self-pay

## 2015-07-04 NOTE — Telephone Encounter (Signed)
error 

## 2015-07-04 NOTE — Telephone Encounter (Signed)
[facility] and ask them if I need to refer her to a mental health professional for evaluation or if they will accept an evaluation from me. If they will accept from me? Do they just need a diagnosis and prognosis or more if so what? Do they have a form I need to fill out?

## 2015-07-04 NOTE — Telephone Encounter (Signed)
Printed and faxed requested letter to number below.

## 2015-07-04 NOTE — Telephone Encounter (Signed)
I have written her letter please print and forward.

## 2015-07-04 NOTE — Telephone Encounter (Addendum)
Relation to ZO:XWRUpt:self Call back number:[phone]  Reason for call:  [facility] with Dx patient advised please call [facility] directly and ask to speak with Kendall FlackKala Moore [phone]

## 2015-07-04 NOTE — Telephone Encounter (Signed)
Advise please on this request 

## 2015-07-05 NOTE — Telephone Encounter (Signed)
Called left a detailed message of request and to call back

## 2015-07-05 NOTE — Telephone Encounter (Signed)
Called [facility] left a detailed message of PCP questiions and to call us back.

## 2015-07-06 ENCOUNTER — Other Ambulatory Visit: Payer: Self-pay | Admitting: Family Medicine

## 2015-07-07 NOTE — Telephone Encounter (Signed)
Called Cynthia FlackKala Moore left detailed message of PCP request.

## 2015-07-11 ENCOUNTER — Telehealth: Payer: Self-pay | Admitting: Family Medicine

## 2015-07-11 DIAGNOSIS — Z789 Other specified health status: Secondary | ICD-10-CM

## 2015-07-11 DIAGNOSIS — F418 Other specified anxiety disorders: Secondary | ICD-10-CM

## 2015-07-11 DIAGNOSIS — Z7289 Other problems related to lifestyle: Secondary | ICD-10-CM

## 2015-07-11 NOTE — Telephone Encounter (Signed)
Put referral in for this patient as PCP instructed. Called the home/cell number of the patient/mom left message to call back.

## 2015-07-11 NOTE — Telephone Encounter (Signed)
Called Cynthia FlackKala Moore left a detailed message.  I have attempted to call this number and have left numerous messages , but have had no return calls.  Advise please.

## 2015-07-11 NOTE — Telephone Encounter (Signed)
Tarry KosKayla Moore returned your call. States that patient seeing a MH Counselor or Child psychotherapistocial Worker is fine

## 2015-07-11 NOTE — Telephone Encounter (Signed)
Called left a detailed message of request.

## 2015-07-11 NOTE — Telephone Encounter (Signed)
Does it have psychiatrist or can it be another mental health professional, such as counselor or social worker?

## 2015-07-11 NOTE — Telephone Encounter (Signed)
Kendall FlackKala Moore returned your call. States that patients Provider CAN NOT do Mental Health Assessment and patient needs to be referred to a psychiatrist for that assessment.  Caller states that no call back is needed.

## 2015-07-12 NOTE — Telephone Encounter (Signed)
Called the cell number left message to call back. 

## 2015-07-12 NOTE — Telephone Encounter (Signed)
Called the cell number left message to call back.

## 2015-07-12 NOTE — Telephone Encounter (Signed)
Called the cell number message to call back.

## 2015-07-13 NOTE — Telephone Encounter (Signed)
Mother called to advise she is out of the country will return Friday afternoon.

## 2015-07-15 NOTE — Telephone Encounter (Signed)
Spoke to the patients mom and did give her  healthcare phone number [phone] to schedule appt. The mom does believe that [facility] with someone they are affiliated with.

## 2015-07-17 NOTE — Progress Notes (Signed)
Subjective:    Patient ID: Cynthia ParrAshley Salazar, female    DOB: 02-Apr-1991, 24 y.o.   MRN: [id number]  Chief Complaint  Patient presents with  . Follow-up    HPI Patient is in today for follow up on her struggles with alcohol she is currently in a residential alcohol rehab program. She is feeling better physically. She is less tired and feels physically well. No new concerns. She continues to struggle with anxiety and low mood but  No suicidal ideation. She is preparing to move to a 1/2 way house when acute treatment is complete. Denies CP/palp/SOB/HA/congestion/fevers/GI or GU c/o. Taking meds as prescribed  Past Medical History  Diagnosis Date  . Chicken pox as a child  . Depression 24 yrs old  . Preventative health care 05/28/2012  . Asthma   . Allergy   . Asthma 05/29/2012  . Insomnia 03/17/2013  . Other and unspecified hyperlipidemia 03/18/2013  . Abnormal liver function test 03/18/2013  . Asthma 05/29/2012  . Depression with anxiety   . Overweight 04/05/2014  . Essential hypertension 06/03/2015  . Alcohol use (HCC) 06/03/2015    Past Surgical History  Procedure Laterality Date  . Wisdom tooth extraction  19 yrs    Family History  Problem Relation Age of Onset  . Nephrolithiasis Father   . Nephrolithiasis Brother   . COPD Paternal Grandmother   . Hypertension Father     Social History   Social History  . Marital Status: Single    Spouse Name: N/A  . Number of Children: N/A  . Years of Education: N/A   Occupational History  . Not on file.   Social History Main Topics  . Smoking status: Former Games developermoker  . Smokeless tobacco: Never Used     Comment: occasionally smoked one  . Alcohol Use: Yes     Comment: occasionally  . Drug Use: No  . Sexual Activity:    Partners: Female   Other Topics Concern  . Not on file   Social History Narrative    Outpatient Prescriptions Prior to Visit  Medication Sig Dispense Refill  . buPROPion (WELLBUTRIN XL) 150 MG 24 hr tablet  Take 1 tablet (150 mg total) by mouth daily. 90 tablet 0  . cetirizine (ZYRTEC) 10 MG tablet TAKE 1 TABLET BY MOUTH DAILY AS NEEDED FOR ALLERGIES 30 tablet 4  . escitalopram (LEXAPRO) 20 MG tablet TAKE 1 TABLET (20 MG TOTAL) BY MOUTH DAILY. 30 tablet 5  . montelukast (SINGULAIR) 10 MG tablet Take 1 tablet (10 mg total) by mouth at bedtime as needed. 90 tablet 0  . PROAIR HFA 108 (90 BASE) MCG/ACT inhaler INHALE 2 PUFFS INTO THE LUNGS EVERY 4 (FOUR) HOURS AS NEEDED. 8.5 each 1  . lisinopril (PRINIVIL,ZESTRIL) 10 MG tablet Take 1 tablet (10 mg total) by mouth 2 (two) times daily. 60 tablet 1  . metoprolol (LOPRESSOR) 100 MG tablet Take 1 tablet (100 mg total) by mouth 2 (two) times daily. 60 tablet 1  . triamcinolone cream (KENALOG) 0.1 % Apply 1 application topically 2 (two) times daily as needed. dermatitis 45 g 1  . ALPRAZolam (XANAX) 0.5 MG tablet Take 1 tablet (0.5 mg total) by mouth 2 (two) times daily as needed for anxiety or sleep. 60 tablet 0  . cyclobenzaprine (FLEXERIL) 10 MG tablet TAKE 1 TABLET BY MOUTH AT BEDTIME AS NEEDED FOR MUSCLE SPASMS (Patient not taking: Reported on 05/26/2015) 20 tablet 1   No facility-administered medications prior to visit.  Allergies  Allergen Reactions  . Aleve [Naproxen Sodium]     wheeze    Review of Systems  Constitutional: Negative for fever and malaise/fatigue.  HENT: Negative for congestion.   Eyes: Negative for discharge.  Respiratory: Negative for shortness of breath.   Cardiovascular: Negative for chest pain, palpitations and leg swelling.  Gastrointestinal: Negative for nausea and abdominal pain.  Genitourinary: Negative for dysuria.  Musculoskeletal: Negative for falls.  Skin: Negative for rash.  Neurological: Negative for loss of consciousness and headaches.  Endo/Heme/Allergies: Negative for environmental allergies.  Psychiatric/Behavioral: Positive for depression. The patient is nervous/anxious.        Objective:      Physical Exam  Constitutional: She is oriented to person, place, and time. She appears well-developed and well-nourished. No distress.  HENT:  Head: Normocephalic and atraumatic.  Nose: Nose normal.  Eyes: Right eye exhibits no discharge. Left eye exhibits no discharge.  Neck: Normal range of motion. Neck supple.  Cardiovascular: Normal rate and regular rhythm.   No murmur heard. Pulmonary/Chest: Effort normal and breath sounds normal.  Abdominal: Soft. Bowel sounds are normal. There is no tenderness.  Musculoskeletal: She exhibits no edema.  Neurological: She is alert and oriented to person, place, and time.  Skin: Skin is warm and dry.  Psychiatric: She has a normal mood and affect.  Nursing note and vitals reviewed.   BP 130/84 mmHg  Pulse 59  Temp(Src) 98.8 F (37.1 C) (Oral)  Ht  (1.549 m)  Wt 160 lb 2 oz (72.632 kg)  BMI 30.27 kg/m2  SpO2 100%  LMP 05/26/2015 Wt Readings from Last 3 Encounters:  07/01/15 160 lb 2 oz (72.632 kg)  05/26/15 158 lb 4 oz (71.782 kg)  05/02/15 161 lb (73.029 kg)     Lab Results  Component Value Date   WBC 8.2 05/10/2015   HGB 14.8 05/10/2015   HCT 43.3 05/10/2015   PLT 269.0 05/10/2015   GLUCOSE 81 05/26/2015   CHOL 248* 03/17/2013   TRIG 170* 03/17/2013   HDL 68 03/17/2013   LDLCALC 146* 03/17/2013   ALT 51* 05/26/2015   AST 51* 05/26/2015   NA 139 05/26/2015   K 4.1 05/26/2015   CL 104 05/26/2015   CREATININE 1.10 05/26/2015   BUN 13 05/26/2015   CO2 22 05/26/2015   TSH 8.36* 05/10/2015    Lab Results  Component Value Date   TSH 8.36* 05/10/2015   Lab Results  Component Value Date   WBC 8.2 05/10/2015   HGB 14.8 05/10/2015   HCT 43.3 05/10/2015   MCV 95.9 05/10/2015   PLT 269.0 05/10/2015   Lab Results  Component Value Date   NA 139 05/26/2015   K 4.1 05/26/2015   CO2 22 05/26/2015   GLUCOSE 81 05/26/2015   BUN 13 05/26/2015   CREATININE 1.10 05/26/2015   BILITOT 0.4 05/26/2015   ALKPHOS 52  05/26/2015   AST 51* 05/26/2015   ALT 51* 05/26/2015   PROT 7.6 05/26/2015   ALBUMIN 4.3 05/26/2015   CALCIUM 9.2 05/26/2015   GFR 64.07 05/26/2015   Lab Results  Component Value Date   CHOL 248* 03/17/2013   Lab Results  Component Value Date   HDL 68 03/17/2013   Lab Results  Component Value Date   LDLCALC 146* 03/17/2013   Lab Results  Component Value Date   TRIG 170* 03/17/2013   Lab Results  Component Value Date   CHOLHDL 3.6 03/17/2013   No results found for:  HGBA1C     Assessment & Plan:   Problem List Items Addressed This Visit    Essential hypertension    Well controlled, no changes to meds. Encouraged heart healthy diet such as the DASH diet and exercise as tolerated.       Depression with anxiety   Relevant Orders   Ambulatory referral to Psychology   Alcohol use (HCC)    Has had trouble with alcohol for years. Is presently in Rehab and is preparing to move into a 1/2 way house once this is complete. She is a god candidate for rehab and medically is stable so she is referred to behavioral health for evaluation in preparation for moving to 1/2 way house. Counseled for 30 minutes.        Other Visit Diagnoses    Encounter for immunization    -  Primary    Alcohol abuse        Relevant Orders    Ambulatory referral to Psychology       I have discontinued Ms. Strickler's triamcinolone cream, cyclobenzaprine, and ALPRAZolam. I am also having her maintain her PROAIR HFA, buPROPion, montelukast, escitalopram, and cetirizine.  No orders of the defined types were placed in this encounter.     Danise Edge, MD

## 2015-07-17 NOTE — Assessment & Plan Note (Signed)
Has had trouble with alcohol for years. Is presently in Rehab and is preparing to move into a 1/2 [address] house once this is complete. She is a god candidate for rehab and medically is stable so she is referred to behavioral health for evaluation in preparation for moving to 1/2 [address] house. Counseled for 30 minutes.

## 2015-07-17 NOTE — Assessment & Plan Note (Signed)
Well controlled, no changes to meds. Encouraged heart healthy diet such as the DASH diet and exercise as tolerated.  °

## 2015-08-01 ENCOUNTER — Telehealth: Payer: Self-pay | Admitting: Family Medicine

## 2015-08-01 NOTE — Telephone Encounter (Signed)
Caller name: Tresa EndoKelly  Relationship to patient: Mother  Can be reached: [phone]   Reason for call: Mother wishes to have patients prescriptions left at the front desk for her to pick up following patients appointment 12/6. Mother does not wish for prescriptions to be returned to rehab. Mother will not be present for appointment but will pick up prescriptions following appt.

## 2015-08-02 NOTE — Telephone Encounter (Signed)
Given the current situation I am ok with this approach if patient is OK with this approach, will just need to confirm this with patient at visit.

## 2015-08-02 NOTE — Telephone Encounter (Signed)
Advise

## 2015-08-08 ENCOUNTER — Telehealth: Payer: Self-pay | Admitting: Behavioral Health

## 2015-08-08 NOTE — Telephone Encounter (Signed)
Per the patient's mother, she's currently at a rehabilitation facility but will be present for the appointment on tomorrow, 08/09/15 at 8:30 AM.

## 2015-08-09 ENCOUNTER — Ambulatory Visit (INDEPENDENT_AMBULATORY_CARE_PROVIDER_SITE_OTHER): Payer: BLUE CROSS/BLUE SHIELD | Admitting: Family Medicine

## 2015-08-09 ENCOUNTER — Encounter: Payer: Self-pay | Admitting: Family Medicine

## 2015-08-09 VITALS — BP 102/64 | HR 67 | Temp 98.1°F | Ht 61.0 in | Wt 165.0 lb

## 2015-08-09 DIAGNOSIS — I10 Essential (primary) hypertension: Secondary | ICD-10-CM | POA: Diagnosis not present

## 2015-08-09 DIAGNOSIS — L309 Dermatitis, unspecified: Secondary | ICD-10-CM | POA: Diagnosis not present

## 2015-08-09 DIAGNOSIS — E782 Mixed hyperlipidemia: Secondary | ICD-10-CM

## 2015-08-09 DIAGNOSIS — Z0000 Encounter for general adult medical examination without abnormal findings: Secondary | ICD-10-CM

## 2015-08-09 DIAGNOSIS — R7989 Other specified abnormal findings of blood chemistry: Secondary | ICD-10-CM

## 2015-08-09 DIAGNOSIS — Z789 Other specified health status: Secondary | ICD-10-CM

## 2015-08-09 DIAGNOSIS — Z7289 Other problems related to lifestyle: Secondary | ICD-10-CM

## 2015-08-09 DIAGNOSIS — R945 Abnormal results of liver function studies: Secondary | ICD-10-CM

## 2015-08-09 LAB — CBC
HEMATOCRIT: 43.3 % (ref 36.0–46.0)
Hemoglobin: 14.3 g/dL (ref 12.0–15.0)
MCHC: 33 g/dL (ref 30.0–36.0)
MCV: 93.7 fl (ref 78.0–100.0)
PLATELETS: 272 10*3/uL (ref 150.0–400.0)
RBC: 4.61 Mil/uL (ref 3.87–5.11)
RDW: 14.5 % (ref 11.5–15.5)
WBC: 8.4 10*3/uL (ref 4.0–10.5)

## 2015-08-09 LAB — LIPID PANEL
CHOL/HDL RATIO: 6
CHOLESTEROL: 222 mg/dL — AB (ref 0–200)
HDL: 37 mg/dL — ABNORMAL LOW (ref >39.00)
NONHDL: 185.09
Triglycerides: 270 mg/dL — ABNORMAL HIGH (ref 0.0–149.0)
VLDL: 54 mg/dL — ABNORMAL HIGH (ref 0.0–40.0)

## 2015-08-09 LAB — COMPREHENSIVE METABOLIC PANEL
ALT: 26 U/L (ref 0–35)
AST: 21 U/L (ref 0–37)
Albumin: 3.9 g/dL (ref 3.5–5.2)
Alkaline Phosphatase: 49 U/L (ref 39–117)
BUN: 9 mg/dL (ref 6–23)
CHLORIDE: 104 meq/L (ref 96–112)
CO2: 28 meq/L (ref 19–32)
Calcium: 9.4 mg/dL (ref 8.4–10.5)
Creatinine, Ser: 0.76 mg/dL (ref 0.40–1.20)
GFR: 98 mL/min (ref >60.00)
Glucose, Bld: 82 mg/dL (ref 70–99)
Potassium: 3.8 mEq/L (ref 3.5–5.1)
Sodium: 139 mEq/L (ref 135–145)
Total Bilirubin: 0.3 mg/dL (ref 0.2–1.2)
Total Protein: 7.1 g/dL (ref 6.0–8.3)

## 2015-08-09 LAB — TSH: TSH: 4.93 u[IU]/mL — ABNORMAL HIGH (ref 0.35–4.50)

## 2015-08-09 LAB — LDL CHOLESTEROL, DIRECT: LDL DIRECT: 154 mg/dL

## 2015-08-09 MED ORDER — MONTELUKAST SODIUM 10 MG PO TABS: 10.0000 mg | ORAL_TABLET | Freq: Every day | ORAL | Status: DC

## 2015-08-09 MED ORDER — CETIRIZINE HCL 10 MG PO TABS: ORAL_TABLET | ORAL | Status: DC

## 2015-08-09 MED ORDER — ALBUTEROL SULFATE HFA 108 (90 BASE) MCG/ACT IN AERS: INHALATION_SPRAY | RESPIRATORY_TRACT | Status: DC

## 2015-08-09 MED ORDER — METOPROLOL TARTRATE 50 MG PO TABS: 50.0000 mg | ORAL_TABLET | Freq: Two times a day (BID) | ORAL | Status: DC

## 2015-08-09 MED ORDER — TRIAMCINOLONE ACETONIDE 0.1 % EX CREA: 1.0000 "application " | TOPICAL_CREAM | Freq: Every evening | CUTANEOUS | Status: DC | PRN

## 2015-08-09 NOTE — Patient Instructions (Signed)
Preventive Care for Adults, Female A healthy lifestyle and preventive care can promote health and wellness. Preventive health guidelines for women include the following key practices.  A routine yearly physical is a good way to check with your health care provider about your health and preventive screening. It is a chance to share any concerns and updates on your health and to receive a thorough exam.  Visit your dentist for a routine exam and preventive care every 6 months. Brush your teeth twice a day and floss once a day. Good oral hygiene prevents tooth decay and gum disease.  The frequency of eye exams is based on your age, health, family medical history, use of contact lenses, and other factors. Follow your health care provider's recommendations for frequency of eye exams.  Eat a healthy diet. Foods like vegetables, fruits, whole grains, low-fat dairy products, and lean protein foods contain the nutrients you need without too many calories. Decrease your intake of foods high in solid fats, added sugars, and salt. Eat the right amount of calories for you.Get information about a proper diet from your health care provider, if necessary.  Regular physical exercise is one of the most important things you can do for your health. Most adults should get at least 150 minutes of moderate-intensity exercise (any activity that increases your heart rate and causes you to sweat) each week. In addition, most adults need muscle-strengthening exercises on 2 or more days a week.  Maintain a healthy weight. The body mass index (BMI) is a screening tool to identify possible weight problems. It provides an estimate of body fat based on height and weight. Your health care provider can find your BMI and can help you achieve or maintain a healthy weight.For adults 20 years and older:  A BMI below 18.5 is considered underweight.  A BMI of 18.5 to 24.9 is normal.  A BMI of 25 to 29.9 is considered overweight.  A  BMI of 30 and above is considered obese.  Maintain normal blood lipids and cholesterol levels by exercising and minimizing your intake of saturated fat. Eat a balanced diet with plenty of fruit and vegetables. Blood tests for lipids and cholesterol should begin at age 45 and be repeated every 5 years. If your lipid or cholesterol levels are high, you are over 50, or you are at high risk for heart disease, you may need your cholesterol levels checked more frequently.Ongoing high lipid and cholesterol levels should be treated with medicines if diet and exercise are not working.  If you smoke, find out from your health care provider how to quit. If you do not use tobacco, do not start.  Lung cancer screening is recommended for adults aged 45-80 years who are at high risk for developing lung cancer because of a history of smoking. A yearly low-dose CT scan of the lungs is recommended for people who have at least a 30-pack-year history of smoking and are a current smoker or have quit within the past 15 years. A pack year of smoking is smoking an average of 1 pack of cigarettes a day for 1 year (for example: 1 pack a day for 30 years or 2 packs a day for 15 years). Yearly screening should continue until the smoker has stopped smoking for at least 15 years. Yearly screening should be stopped for people who develop a health problem that would prevent them from having lung cancer treatment.  If you are pregnant, do not drink alcohol. If you are  breastfeeding, be very cautious about drinking alcohol. If you are not pregnant and choose to drink alcohol, do not have more than 1 drink per day. One drink is considered to be 12 ounces (355 mL) of beer, 5 ounces (148 mL) of wine, or 1.5 ounces (44 mL) of liquor.  Avoid use of street drugs. Do not share needles with anyone. Ask for help if you need support or instructions about stopping the use of drugs.  High blood pressure causes heart disease and increases the risk  of stroke. Your blood pressure should be checked at least every 1 to 2 years. Ongoing high blood pressure should be treated with medicines if weight loss and exercise do not work.  If you are 55-79 years old, ask your health care provider if you should take aspirin to prevent strokes.  Diabetes screening is done by taking a blood sample to check your blood glucose level after you have not eaten for a certain period of time (fasting). If you are not overweight and you do not have risk factors for diabetes, you should be screened once every 3 years starting at age 45. If you are overweight or obese and you are 40-70 years of age, you should be screened for diabetes every year as part of your cardiovascular risk assessment.  Breast cancer screening is essential preventive care for women. You should practice "breast self-awareness." This means understanding the normal appearance and feel of your breasts and may include breast self-examination. Any changes detected, no matter how small, should be reported to a health care provider. Women in their 20s and 30s should have a clinical breast exam (CBE) by a health care provider as part of a regular health exam every 1 to 3 years. After age 40, women should have a CBE every year. Starting at age 40, women should consider having a mammogram (breast X-ray test) every year. Women who have a family history of breast cancer should talk to their health care provider about genetic screening. Women at a high risk of breast cancer should talk to their health care providers about having an MRI and a mammogram every year.  Breast cancer gene (BRCA)-related cancer risk assessment is recommended for women who have family members with BRCA-related cancers. BRCA-related cancers include breast, ovarian, tubal, and peritoneal cancers. Having family members with these cancers may be associated with an increased risk for harmful changes (mutations) in the breast cancer genes BRCA1 and  BRCA2. Results of the assessment will determine the need for genetic counseling and BRCA1 and BRCA2 testing.  Your health care provider may recommend that you be screened regularly for cancer of the pelvic organs (ovaries, uterus, and vagina). This screening involves a pelvic examination, including checking for microscopic changes to the surface of your cervix (Pap test). You may be encouraged to have this screening done every 3 years, beginning at age 21.  For women ages 30-65, health care providers may recommend pelvic exams and Pap testing every 3 years, or they may recommend the Pap and pelvic exam, combined with testing for human papilloma virus (HPV), every 5 years. Some types of HPV increase your risk of cervical cancer. Testing for HPV may also be done on women of any age with unclear Pap test results.  Other health care providers may not recommend any screening for nonpregnant women who are considered low risk for pelvic cancer and who do not have symptoms. Ask your health care provider if a screening pelvic exam is right for   you.  If you have had past treatment for cervical cancer or a condition that could lead to cancer, you need Pap tests and screening for cancer for at least 20 years after your treatment. If Pap tests have been discontinued, your risk factors (such as having a new sexual partner) need to be reassessed to determine if screening should resume. Some women have medical problems that increase the chance of getting cervical cancer. In these cases, your health care provider may recommend more frequent screening and Pap tests.  Colorectal cancer can be detected and often prevented. Most routine colorectal cancer screening begins at the age of 50 years and continues through age 75 years. However, your health care provider may recommend screening at an earlier age if you have risk factors for colon cancer. On a yearly basis, your health care provider may provide home test kits to check  for hidden blood in the stool. Use of a small camera at the end of a tube, to directly examine the colon (sigmoidoscopy or colonoscopy), can detect the earliest forms of colorectal cancer. Talk to your health care provider about this at age 50, when routine screening begins. Direct exam of the colon should be repeated every 5-10 years through age 75 years, unless early forms of precancerous polyps or small growths are found.  People who are at an increased risk for hepatitis B should be screened for this virus. You are considered at high risk for hepatitis B if:  You were born in a country where hepatitis B occurs often. Talk with your health care provider about which countries are considered high risk.  Your parents were born in a high-risk country and you have not received a shot to protect against hepatitis B (hepatitis B vaccine).  You have HIV or AIDS.  You use needles to inject street drugs.  You live with, or have sex with, someone who has hepatitis B.  You get hemodialysis treatment.  You take certain medicines for conditions like cancer, organ transplantation, and autoimmune conditions.  Hepatitis C blood testing is recommended for all people born from 1945 through 1965 and any individual with known risks for hepatitis C.  Practice safe sex. Use condoms and avoid high-risk sexual practices to reduce the spread of sexually transmitted infections (STIs). STIs include gonorrhea, chlamydia, syphilis, trichomonas, herpes, HPV, and human immunodeficiency virus (HIV). Herpes, HIV, and HPV are viral illnesses that have no cure. They can result in disability, cancer, and death.  You should be screened for sexually transmitted illnesses (STIs) including gonorrhea and chlamydia if:  You are sexually active and are younger than 24 years.  You are older than 24 years and your health care provider tells you that you are at risk for this type of infection.  Your sexual activity has changed  since you were last screened and you are at an increased risk for chlamydia or gonorrhea. Ask your health care provider if you are at risk.  If you are at risk of being infected with HIV, it is recommended that you take a prescription medicine daily to prevent HIV infection. This is called preexposure prophylaxis (PrEP). You are considered at risk if:  You are sexually active and do not regularly use condoms or know the HIV status of your partner(s).  You take drugs by injection.  You are sexually active with a partner who has HIV.  Talk with your health care provider about whether you are at high risk of being infected with HIV. If   you choose to begin PrEP, you should first be tested for HIV. You should then be tested every 3 months for as long as you are taking PrEP.  Osteoporosis is a disease in which the bones lose minerals and strength with aging. This can result in serious bone fractures or breaks. The risk of osteoporosis can be identified using a bone density scan. Women ages 67 years and over and women at risk for fractures or osteoporosis should discuss screening with their health care providers. Ask your health care provider whether you should take a calcium supplement or vitamin D to reduce the rate of osteoporosis.  Menopause can be associated with physical symptoms and risks. Hormone replacement therapy is available to decrease symptoms and risks. You should talk to your health care provider about whether hormone replacement therapy is right for you.  Use sunscreen. Apply sunscreen liberally and repeatedly throughout the day. You should seek shade when your shadow is shorter than you. Protect yourself by wearing long sleeves, pants, a wide-brimmed hat, and sunglasses year round, whenever you are outdoors.  Once a month, do a whole body skin exam, using a mirror to look at the skin on your back. Tell your health care provider of new moles, moles that have irregular borders, moles that  are larger than a pencil eraser, or moles that have changed in shape or color.  Stay current with required vaccines (immunizations).  Influenza vaccine. All adults should be immunized every year.  Tetanus, diphtheria, and acellular pertussis (Td, Tdap) vaccine. Pregnant women should receive 1 dose of Tdap vaccine during each pregnancy. The dose should be obtained regardless of the length of time since the last dose. Immunization is preferred during the 27th-36th week of gestation. An adult who has not previously received Tdap or who does not know her vaccine status should receive 1 dose of Tdap. This initial dose should be followed by tetanus and diphtheria toxoids (Td) booster doses every 10 years. Adults with an unknown or incomplete history of completing a 3-dose immunization series with Td-containing vaccines should begin or complete a primary immunization series including a Tdap dose. Adults should receive a Td booster every 10 years.  Varicella vaccine. An adult without evidence of immunity to varicella should receive 2 doses or a second dose if she has previously received 1 dose. Pregnant females who do not have evidence of immunity should receive the first dose after pregnancy. This first dose should be obtained before leaving the health care facility. The second dose should be obtained 4-8 weeks after the first dose.  Human papillomavirus (HPV) vaccine. Females aged 13-26 years who have not received the vaccine previously should obtain the 3-dose series. The vaccine is not recommended for use in pregnant females. However, pregnancy testing is not needed before receiving a dose. If a female is found to be pregnant after receiving a dose, no treatment is needed. In that case, the remaining doses should be delayed until after the pregnancy. Immunization is recommended for any person with an immunocompromised condition through the age of 61 years if she did not get any or all doses earlier. During the  3-dose series, the second dose should be obtained 4-8 weeks after the first dose. The third dose should be obtained 24 weeks after the first dose and 16 weeks after the second dose.  Zoster vaccine. One dose is recommended for adults aged 30 years or older unless certain conditions are present.  Measles, mumps, and rubella (MMR) vaccine. Adults born  before 1957 generally are considered immune to measles and mumps. Adults born in 1957 or later should have 1 or more doses of MMR vaccine unless there is a contraindication to the vaccine or there is laboratory evidence of immunity to each of the three diseases. A routine second dose of MMR vaccine should be obtained at least 28 days after the first dose for students attending postsecondary schools, health care workers, or international travelers. People who received inactivated measles vaccine or an unknown type of measles vaccine during 1963-1967 should receive 2 doses of MMR vaccine. People who received inactivated mumps vaccine or an unknown type of mumps vaccine before 1979 and are at high risk for mumps infection should consider immunization with 2 doses of MMR vaccine. For females of childbearing age, rubella immunity should be determined. If there is no evidence of immunity, females who are not pregnant should be vaccinated. If there is no evidence of immunity, females who are pregnant should delay immunization until after pregnancy. Unvaccinated health care workers born before 1957 who lack laboratory evidence of measles, mumps, or rubella immunity or laboratory confirmation of disease should consider measles and mumps immunization with 2 doses of MMR vaccine or rubella immunization with 1 dose of MMR vaccine.  Pneumococcal 13-valent conjugate (PCV13) vaccine. When indicated, a person who is uncertain of his immunization history and has no record of immunization should receive the PCV13 vaccine. All adults 65 years of age and older should receive this  vaccine. An adult aged 19 years or older who has certain medical conditions and has not been previously immunized should receive 1 dose of PCV13 vaccine. This PCV13 should be followed with a dose of pneumococcal polysaccharide (PPSV23) vaccine. Adults who are at high risk for pneumococcal disease should obtain the PPSV23 vaccine at least 8 weeks after the dose of PCV13 vaccine. Adults older than 25 years of age who have normal immune system function should obtain the PPSV23 vaccine dose at least 1 year after the dose of PCV13 vaccine.  Pneumococcal polysaccharide (PPSV23) vaccine. When PCV13 is also indicated, PCV13 should be obtained first. All adults aged 65 years and older should be immunized. An adult younger than age 65 years who has certain medical conditions should be immunized. Any person who resides in a nursing home or long-term care facility should be immunized. An adult smoker should be immunized. People with an immunocompromised condition and certain other conditions should receive both PCV13 and PPSV23 vaccines. People with human immunodeficiency virus (HIV) infection should be immunized as soon as possible after diagnosis. Immunization during chemotherapy or radiation therapy should be avoided. Routine use of PPSV23 vaccine is not recommended for American Indians, [place] Natives, or people younger than 65 years unless there are medical conditions that require PPSV23 vaccine. When indicated, people who have unknown immunization and have no record of immunization should receive PPSV23 vaccine. One-time revaccination 5 years after the first dose of PPSV23 is recommended for people aged 19-64 years who have chronic kidney failure, nephrotic syndrome, asplenia, or immunocompromised conditions. People who received 1-2 doses of PPSV23 before age 65 years should receive another dose of PPSV23 vaccine at age 65 years or later if at least 5 years have passed since the previous dose. Doses of PPSV23 are not  needed for people immunized with PPSV23 at or after age 65 years.  Meningococcal vaccine. Adults with asplenia or persistent complement component deficiencies should receive 2 doses of quadrivalent meningococcal conjugate (MenACWY-D) vaccine. The doses should be obtained   at least 2 months apart. Microbiologists working with certain meningococcal bacteria, [place] recruits, people at risk during an outbreak, and people who travel to or live in countries with a high rate of meningitis should be immunized. A first-year college student up through age 34 years who is living in a residence hall should receive a dose if she did not receive a dose on or after her 16th birthday. Adults who have certain high-risk conditions should receive one or more doses of vaccine.  Hepatitis A vaccine. Adults who wish to be protected from this disease, have certain high-risk conditions, work with hepatitis A-infected animals, work in hepatitis A research labs, or travel to or work in countries with a high rate of hepatitis A should be immunized. Adults who were previously unvaccinated and who anticipate close contact with an international adoptee during the first 60 days after arrival in the [place] from a country with a high rate of hepatitis A should be immunized.  Hepatitis B vaccine. Adults who wish to be protected from this disease, have certain high-risk conditions, may be exposed to blood or other infectious body fluids, are household contacts or sex partners of hepatitis B positive people, are clients or workers in certain care facilities, or travel to or work in countries with a high rate of hepatitis B should be immunized.  Haemophilus influenzae type b (Hib) vaccine. A previously unvaccinated person with asplenia or sickle cell disease or having a scheduled splenectomy should receive 1 dose of Hib vaccine. Regardless of previous immunization, a recipient of a hematopoietic stem cell transplant should receive a  3-dose series 6-12 months after her successful transplant. Hib vaccine is not recommended for adults with HIV infection. Preventive Services / Frequency Ages 35 to 4 years  Blood pressure check.** / Every 3-5 years.  Lipid and cholesterol check.** / Every 5 years beginning at age 60.  Clinical breast exam.** / Every 3 years for women in their 71s and 10s.  BRCA-related cancer risk assessment.** / For women who have family members with a BRCA-related cancer (breast, ovarian, tubal, or peritoneal cancers).  Pap test.** / Every 2 years from ages 76 through 26. Every 3 years starting at age 61 through age 76 or 93 with a history of 3 consecutive normal Pap tests.  HPV screening.** / Every 3 years from ages 37 through ages 60 to 51 with a history of 3 consecutive normal Pap tests.  Hepatitis C blood test.** / For any individual with known risks for hepatitis C.  Skin self-exam. / Monthly.  Influenza vaccine. / Every year.  Tetanus, diphtheria, and acellular pertussis (Tdap, Td) vaccine.** / Consult your health care provider. Pregnant women should receive 1 dose of Tdap vaccine during each pregnancy. 1 dose of Td every 10 years.  Varicella vaccine.** / Consult your health care provider. Pregnant females who do not have evidence of immunity should receive the first dose after pregnancy.  HPV vaccine. / 3 doses over 6 months, if 93 and younger. The vaccine is not recommended for use in pregnant females. However, pregnancy testing is not needed before receiving a dose.  Measles, mumps, rubella (MMR) vaccine.** / You need at least 1 dose of MMR if you were born in 1957 or later. You may also need a 2nd dose. For females of childbearing age, rubella immunity should be determined. If there is no evidence of immunity, females who are not pregnant should be vaccinated. If there is no evidence of immunity, females who are  pregnant should delay immunization until after pregnancy.  Pneumococcal  13-valent conjugate (PCV13) vaccine.** / Consult your health care provider.  Pneumococcal polysaccharide (PPSV23) vaccine.** / 1 to 2 doses if you smoke cigarettes or if you have certain conditions.  Meningococcal vaccine.** / 1 dose if you are age 68 to 8 years and a Market researcher living in a residence hall, or have one of several medical conditions, you need to get vaccinated against meningococcal disease. You may also need additional booster doses.  Hepatitis A vaccine.** / Consult your health care provider.  Hepatitis B vaccine.** / Consult your health care provider.  Haemophilus influenzae type b (Hib) vaccine.** / Consult your health care provider. Ages 7 to 53 years  Blood pressure check.** / Every year.  Lipid and cholesterol check.** / Every 5 years beginning at age 25 years.  Lung cancer screening. / Every year if you are aged 11-80 years and have a 30-pack-year history of smoking and currently smoke or have quit within the past 15 years. Yearly screening is stopped once you have quit smoking for at least 15 years or develop a health problem that would prevent you from having lung cancer treatment.  Clinical breast exam.** / Every year after age 48 years.  BRCA-related cancer risk assessment.** / For women who have family members with a BRCA-related cancer (breast, ovarian, tubal, or peritoneal cancers).  Mammogram.** / Every year beginning at age 41 years and continuing for as long as you are in good health. Consult with your health care provider.  Pap test.** / Every 3 years starting at age 65 years through age 37 or 70 years with a history of 3 consecutive normal Pap tests.  HPV screening.** / Every 3 years from ages 72 years through ages 60 to 40 years with a history of 3 consecutive normal Pap tests.  Fecal occult blood test (FOBT) of stool. / Every year beginning at age 21 years and continuing until age 5 years. You may not need to do this test if you get  a colonoscopy every 10 years.  Flexible sigmoidoscopy or colonoscopy.** / Every 5 years for a flexible sigmoidoscopy or every 10 years for a colonoscopy beginning at age 35 years and continuing until age 48 years.  Hepatitis C blood test.** / For all people born from [birth year] through 1965 and any individual with known risks for hepatitis C.  Skin self-exam. / Monthly.  Influenza vaccine. / Every year.  Tetanus, diphtheria, and acellular pertussis (Tdap/Td) vaccine.** / Consult your health care provider. Pregnant women should receive 1 dose of Tdap vaccine during each pregnancy. 1 dose of Td every 10 years.  Varicella vaccine.** / Consult your health care provider. Pregnant females who do not have evidence of immunity should receive the first dose after pregnancy.  Zoster vaccine.** / 1 dose for adults aged 30 years or older.  Measles, mumps, rubella (MMR) vaccine.** / You need at least 1 dose of MMR if you were born in 1957 or later. You may also need a second dose. For females of childbearing age, rubella immunity should be determined. If there is no evidence of immunity, females who are not pregnant should be vaccinated. If there is no evidence of immunity, females who are pregnant should delay immunization until after pregnancy.  Pneumococcal 13-valent conjugate (PCV13) vaccine.** / Consult your health care provider.  Pneumococcal polysaccharide (PPSV23) vaccine.** / 1 to 2 doses if you smoke cigarettes or if you have certain conditions.  Meningococcal vaccine.** /  Consult your health care provider.  Hepatitis A vaccine.** / Consult your health care provider.  Hepatitis B vaccine.** / Consult your health care provider.  Haemophilus influenzae type b (Hib) vaccine.** / Consult your health care provider. Ages 64 years and over  Blood pressure check.** / Every year.  Lipid and cholesterol check.** / Every 5 years beginning at age 23 years.  Lung cancer screening. / Every year if you  are aged 16-80 years and have a 30-pack-year history of smoking and currently smoke or have quit within the past 15 years. Yearly screening is stopped once you have quit smoking for at least 15 years or develop a health problem that would prevent you from having lung cancer treatment.  Clinical breast exam.** / Every year after age 74 years.  BRCA-related cancer risk assessment.** / For women who have family members with a BRCA-related cancer (breast, ovarian, tubal, or peritoneal cancers).  Mammogram.** / Every year beginning at age 44 years and continuing for as long as you are in good health. Consult with your health care provider.  Pap test.** / Every 3 years starting at age 58 years through age 22 or 39 years with 3 consecutive normal Pap tests. Testing can be stopped between 65 and 70 years with 3 consecutive normal Pap tests and no abnormal Pap or HPV tests in the past 10 years.  HPV screening.** / Every 3 years from ages 64 years through ages 70 or 61 years with a history of 3 consecutive normal Pap tests. Testing can be stopped between 65 and 70 years with 3 consecutive normal Pap tests and no abnormal Pap or HPV tests in the past 10 years.  Fecal occult blood test (FOBT) of stool. / Every year beginning at age 40 years and continuing until age 27 years. You may not need to do this test if you get a colonoscopy every 10 years.  Flexible sigmoidoscopy or colonoscopy.** / Every 5 years for a flexible sigmoidoscopy or every 10 years for a colonoscopy beginning at age 7 years and continuing until age 32 years.  Hepatitis C blood test.** / For all people born from [birth year] through 1966 and any individual with known risks for hepatitis C.  Osteoporosis screening.** / A one-time screening for women ages 30 years and over and women at risk for fractures or osteoporosis.  Skin self-exam. / Monthly.  Influenza vaccine. / Every year.  Tetanus, diphtheria, and acellular pertussis (Tdap/Td)  vaccine.** / 1 dose of Td every 10 years.  Varicella vaccine.** / Consult your health care provider.  Zoster vaccine.** / 1 dose for adults aged 35 years or older.  Pneumococcal 13-valent conjugate (PCV13) vaccine.** / Consult your health care provider.  Pneumococcal polysaccharide (PPSV23) vaccine.** / 1 dose for all adults aged 46 years and older.  Meningococcal vaccine.** / Consult your health care provider.  Hepatitis A vaccine.** / Consult your health care provider.  Hepatitis B vaccine.** / Consult your health care provider.  Haemophilus influenzae type b (Hib) vaccine.** / Consult your health care provider. ** Family history and personal history of risk and conditions may change your health care provider's recommendations.   This information is not intended to replace advice given to you by your health care provider. Make sure you discuss any questions you have with your health care provider.   Document Released: 10/16/2001 Document Revised: 09/10/2014 Document Reviewed: 01/15/2011 Elsevier Interactive Patient Education [employer].

## 2015-08-09 NOTE — Progress Notes (Signed)
Pre visit review using our clinic review tool, if applicable. No additional management support is needed unless otherwise documented below in the visit note. 

## 2015-08-10 ENCOUNTER — Telehealth: Payer: Self-pay | Admitting: Family Medicine

## 2015-08-10 ENCOUNTER — Other Ambulatory Visit (INDEPENDENT_AMBULATORY_CARE_PROVIDER_SITE_OTHER): Payer: BLUE CROSS/BLUE SHIELD

## 2015-08-10 DIAGNOSIS — R946 Abnormal results of thyroid function studies: Secondary | ICD-10-CM

## 2015-08-10 DIAGNOSIS — R7989 Other specified abnormal findings of blood chemistry: Secondary | ICD-10-CM

## 2015-08-10 LAB — T4, FREE: FREE T4: 0.61 ng/dL (ref 0.60–1.60)

## 2015-08-10 MED ORDER — LISINOPRIL 10 MG PO TABS: 10.0000 mg | ORAL_TABLET | Freq: Two times a day (BID) | ORAL | Status: DC

## 2015-08-10 NOTE — Telephone Encounter (Signed)
Rx sent to pharmacy   

## 2015-08-10 NOTE — Telephone Encounter (Signed)
Caller name: [facility] [place]  [phone] [place],  - [address] [phone] (Phone) [phone] (Fax)        Reason for call:  Requesting a 90 day supply lisinopril (PRINIVIL,ZESTRIL) 10 MG tablet

## 2015-08-11 ENCOUNTER — Encounter: Payer: Self-pay | Admitting: Family Medicine

## 2015-08-11 ENCOUNTER — Other Ambulatory Visit: Payer: Self-pay | Admitting: Family Medicine

## 2015-08-11 MED ORDER — LISINOPRIL 10 MG PO TABS: 10.0000 mg | ORAL_TABLET | Freq: Two times a day (BID) | ORAL | Status: DC

## 2015-08-13 NOTE — Progress Notes (Signed)
Subjective:    Patient ID: Cynthia ParrAshley Salazar, female    DOB: 04-Sep-1990, 24 y.o.   MRN: [id number]  Chief Complaint  Patient presents with  . Annual Exam    HPI Patient is in today for annual exam. She is doing well. She is in residential rehabilitation and has been abstaining from alcohol. She reports physically feeling well. No recent illness or acute complaints. Only symptom noted is occasional upper back pain. No falls or injury and no radicular symptoms. Denies CP/palp/SOB/HA/congestion/fevers/GI or GU c/o. Taking meds as prescribed  Past Medical History  Diagnosis Date  . Chicken pox as a child  . Depression 24 yrs old  . Preventative health care 05/28/2012  . Asthma   . Allergy   . Asthma 05/29/2012  . Insomnia 03/17/2013  . Other and unspecified hyperlipidemia 03/18/2013  . Abnormal liver function test 03/18/2013  . Asthma 05/29/2012  . Depression with anxiety   . Overweight 04/05/2014  . Essential hypertension 06/03/2015  . Alcohol use (HCC) 06/03/2015    Past Surgical History  Procedure Laterality Date  . Wisdom tooth extraction  19 yrs    Family History  Problem Relation Age of Onset  . Nephrolithiasis Father   . Nephrolithiasis Brother   . COPD Paternal Grandmother   . Hypertension Father     Social History   Social History  . Marital Status: Single    Spouse Name: N/A  . Number of Children: N/A  . Years of Education: N/A   Occupational History  . Not on file.   Social History Main Topics  . Smoking status: Former Games developermoker  . Smokeless tobacco: Never Used     Comment: occasionally smoked one  . Alcohol Use: Yes     Comment: occasionally  . Drug Use: No  . Sexual Activity:    Partners: Female   Other Topics Concern  . Not on file   Social History Narrative    Outpatient Prescriptions Prior to Visit  Medication Sig Dispense Refill  . cetirizine (ZYRTEC) 10 MG tablet TAKE 1 TABLET BY MOUTH DAILY AS NEEDED FOR ALLERGIES 30 tablet 4  . lisinopril  (PRINIVIL,ZESTRIL) 10 MG tablet TAKE 1 TABLET BY MOUTH TWICE A DAY 60 tablet 1  . metoprolol (LOPRESSOR) 100 MG tablet TAKE 1 TABLET (100 MG TOTAL) BY MOUTH 2 (TWO) TIMES DAILY. 60 tablet 1  . montelukast (SINGULAIR) 10 MG tablet Take 1 tablet (10 mg total) by mouth at bedtime as needed. 90 tablet 0  . PROAIR HFA 108 (90 BASE) MCG/ACT inhaler INHALE 2 PUFFS INTO THE LUNGS EVERY 4 (FOUR) HOURS AS NEEDED. 8.5 each 1  . buPROPion (WELLBUTRIN XL) 150 MG 24 hr tablet Take 1 tablet (150 mg total) by mouth daily. 90 tablet 0  . escitalopram (LEXAPRO) 20 MG tablet TAKE 1 TABLET (20 MG TOTAL) BY MOUTH DAILY. 30 tablet 5   No facility-administered medications prior to visit.    Allergies  Allergen Reactions  . Aleve [Naproxen Sodium]     wheeze    Review of Systems  Constitutional: Negative for fever, chills and malaise/fatigue.  HENT: Negative for congestion and hearing loss.   Eyes: Negative for discharge.  Respiratory: Negative for cough, sputum production and shortness of breath.   Cardiovascular: Negative for chest pain, palpitations and leg swelling.  Gastrointestinal: Negative for heartburn, nausea, vomiting, abdominal pain, diarrhea, constipation and blood in stool.  Genitourinary: Negative for dysuria, urgency, frequency and hematuria.  Musculoskeletal: Positive for back pain. Negative for  myalgias and falls.  Skin: Positive for rash.  Neurological: Negative for dizziness, sensory change, loss of consciousness, weakness and headaches.  Endo/Heme/Allergies: Negative for environmental allergies. Does not bruise/bleed easily.  Psychiatric/Behavioral: Negative for depression and suicidal ideas. The patient is not nervous/anxious and does not have insomnia.        Objective:    Physical Exam  Constitutional: She is oriented to person, place, and time. She appears well-developed and well-nourished. No distress.  HENT:  Head: Normocephalic and atraumatic.  Eyes: Conjunctivae are normal.   Neck: Neck supple. No thyromegaly present.  Cardiovascular: Normal rate, regular rhythm and normal heart sounds.   No murmur heard. Pulmonary/Chest: Effort normal and breath sounds normal. No respiratory distress.  Abdominal: Soft. Bowel sounds are normal. She exhibits no distension and no mass. There is no tenderness.  Musculoskeletal: She exhibits no edema.  Lymphadenopathy:    She has no cervical adenopathy.  Neurological: She is alert and oriented to person, place, and time.  Skin: Skin is warm and dry.  Psychiatric: She has a normal mood and affect. Her behavior is normal.    BP 102/64 mmHg  Pulse 67  Temp(Src) 98.1 F (36.7 C) (Oral)  Ht  (1.549 m)  Wt 165 lb (74.844 kg)  BMI 31.19 kg/m2  SpO2 94%  LMP 07/06/2015 Wt Readings from Last 3 Encounters:  08/09/15 165 lb (74.844 kg)  07/01/15 160 lb 2 oz (72.632 kg)  05/26/15 158 lb 4 oz (71.782 kg)     Lab Results  Component Value Date   WBC 8.4 08/09/2015   HGB 14.3 08/09/2015   HCT 43.3 08/09/2015   PLT 272.0 08/09/2015   GLUCOSE 82 08/09/2015   CHOL 222* 08/09/2015   TRIG 270.0* 08/09/2015   HDL 37.00* 08/09/2015   LDLDIRECT 154.0 08/09/2015   LDLCALC 146* 03/17/2013   ALT 26 08/09/2015   AST 21 08/09/2015   NA 139 08/09/2015   K 3.8 08/09/2015   CL 104 08/09/2015   CREATININE 0.76 08/09/2015   BUN 9 08/09/2015   CO2 28 08/09/2015   TSH 4.93* 08/09/2015    Lab Results  Component Value Date   TSH 4.93* 08/09/2015   Lab Results  Component Value Date   WBC 8.4 08/09/2015   HGB 14.3 08/09/2015   HCT 43.3 08/09/2015   MCV 93.7 08/09/2015   PLT 272.0 08/09/2015   Lab Results  Component Value Date   NA 139 08/09/2015   K 3.8 08/09/2015   CO2 28 08/09/2015   GLUCOSE 82 08/09/2015   BUN 9 08/09/2015   CREATININE 0.76 08/09/2015   BILITOT 0.3 08/09/2015   ALKPHOS 49 08/09/2015   AST 21 08/09/2015   ALT 26 08/09/2015   PROT 7.1 08/09/2015   ALBUMIN 3.9 08/09/2015   CALCIUM 9.4 08/09/2015     GFR 98.00 08/09/2015   Lab Results  Component Value Date   CHOL 222* 08/09/2015   Lab Results  Component Value Date   HDL 37.00* 08/09/2015   Lab Results  Component Value Date   LDLCALC 146* 03/17/2013   Lab Results  Component Value Date   TRIG 270.0* 08/09/2015   Lab Results  Component Value Date   CHOLHDL 6 08/09/2015   No results found for: HGBA1C     Assessment & Plan:   Problem List Items Addressed This Visit    Abnormal liver function test    Resolved with alcohol avoidance.       Alcohol use (HCC)  Is in residential rehab and doing very well. Will likely be moving to a different home next month. Has been evaluated by the facility already      Essential hypertension   Relevant Medications   metoprolol (LOPRESSOR) 50 MG tablet   Other Relevant Orders   TSH (Completed)   CBC (Completed)   Lipid panel (Completed)   Comprehensive metabolic panel (Completed)   Hyperlipidemia, mixed - Primary    Encouraged heart healthy diet, increase exercise, avoid trans fats, consider a krill oil cap daily      Relevant Medications   metoprolol (LOPRESSOR) 50 MG tablet   Other Relevant Orders   TSH (Completed)   CBC (Completed)   Lipid panel (Completed)   Comprehensive metabolic panel (Completed)   Preventative health care    Patient encouraged to maintain heart healthy diet, regular exercise, adequate sleep. Consider daily probiotics. Take medications as prescribed       Other Visit Diagnoses    Dermatitis        Relevant Medications    triamcinolone cream (KENALOG) 0.1 %       I have discontinued Ms. Windom's buPROPion, escitalopram, and metoprolol. I have changed her PROAIR HFA to albuterol. I have also changed her montelukast. Additionally, I am having her start on metoprolol and triamcinolone cream. Lastly, I am having her maintain her cetirizine.  Meds ordered this encounter  Medications  . montelukast (SINGULAIR) 10 MG tablet    Sig: Take 1  tablet (10 mg total) by mouth at bedtime.    Dispense:  90 tablet    Refill:  3  . albuterol (PROAIR HFA) 108 (90 BASE) MCG/ACT inhaler    Sig: INHALE 2 PUFFS INTO THE LUNGS EVERY 4 (FOUR) HOURS AS NEEDED.    Dispense:  54 each    Refill:  1  . cetirizine (ZYRTEC) 10 MG tablet    Sig: TAKE 1 TABLET BY MOUTH DAILY AS NEEDED FOR ALLERGIES    Dispense:  90 tablet    Refill:  1  . metoprolol (LOPRESSOR) 50 MG tablet    Sig: Take 1 tablet (50 mg total) by mouth 2 (two) times daily.    Dispense:  180 tablet    Refill:  1  . triamcinolone cream (KENALOG) 0.1 %    Sig: Apply 1 application topically at bedtime as needed.    Dispense:  80 g    Refill:  01     Danise Edge, MD

## 2015-08-13 NOTE — Assessment & Plan Note (Signed)
Encouraged heart healthy diet, increase exercise, avoid trans fats, consider a krill oil cap daily 

## 2015-08-13 NOTE — Assessment & Plan Note (Signed)
Is in residential rehab and doing very well. Will likely be moving to a different home next month. Has been evaluated by the facility already

## 2015-08-13 NOTE — Assessment & Plan Note (Signed)
Patient encouraged to maintain heart healthy diet, regular exercise, adequate sleep. Consider daily probiotics. Take medications as prescribed 

## 2015-08-13 NOTE — Assessment & Plan Note (Signed)
Resolved with alcohol avoidance.

## 2015-08-22 ENCOUNTER — Encounter: Payer: BLUE CROSS/BLUE SHIELD | Admitting: Family Medicine

## 2015-09-08 ENCOUNTER — Encounter: Payer: Self-pay | Admitting: Family Medicine

## 2015-09-08 ENCOUNTER — Ambulatory Visit (INDEPENDENT_AMBULATORY_CARE_PROVIDER_SITE_OTHER): Payer: BLUE CROSS/BLUE SHIELD | Admitting: Family Medicine

## 2015-09-08 VITALS — BP 106/61 | HR 64

## 2015-09-08 DIAGNOSIS — I10 Essential (primary) hypertension: Secondary | ICD-10-CM | POA: Diagnosis not present

## 2015-09-08 NOTE — Progress Notes (Signed)
RN blood check note reviewed. Agree with documention and plan. 

## 2015-09-08 NOTE — Progress Notes (Signed)
Pre visit review using our clinic review tool, if applicable. No additional management support is needed unless otherwise documented below in the visit note.  Patient in for BP check today. Patient states she has not had BP medication this morning. Patient taking Lisinopril 10 mg daily and Metoprolol 50 mg 2 times daily.  BP=106/61 Pulse 63 O2=100 No dizziness,SOB. Patient states she feels fine. Will be going to Rehab for 3 mos-1 year. Waiting for opening within the next month. Patient will follow up with Dr. Abner GreenspanBlyth before she leaves.

## 2015-09-12 ENCOUNTER — Telehealth: Payer: Self-pay | Admitting: Family Medicine

## 2015-09-12 NOTE — Telephone Encounter (Signed)
Caller name: Morrie Sheldonshley Can be reached: [phone]  Reason for call: [facility]. She will be going next week to [place] rehab center in [place]. She is needing us to send copy of OV from [facility] and lab results to them so they can get her into the rehab facility. Fax # [phone].

## 2015-09-12 NOTE — Telephone Encounter (Signed)
Faxed information as requested. Patient informed done.

## 2015-11-07 ENCOUNTER — Encounter: Payer: 59 | Admitting: Family Medicine

## 2016-01-09 ENCOUNTER — Telehealth: Payer: Self-pay | Admitting: Family Medicine

## 2016-01-09 MED ORDER — CETIRIZINE HCL 10 MG PO TABS: ORAL_TABLET | ORAL | Status: DC

## 2016-01-09 MED ORDER — ALBUTEROL SULFATE HFA 108 (90 BASE) MCG/ACT IN AERS: INHALATION_SPRAY | RESPIRATORY_TRACT | Status: DC

## 2016-01-09 MED ORDER — MONTELUKAST SODIUM 10 MG PO TABS: 10.0000 mg | ORAL_TABLET | Freq: Every day | ORAL | Status: DC

## 2016-01-09 NOTE — Telephone Encounter (Signed)
Pt's mom want to make PCP aware that pt is in rehab in [place] she says that PCP is aware of the situation.   Pt would like to have all of her medications [facility] - [address]   [place], [place] [id number]     - Phone -[phone]  She would like to have them filled today if possible, made pt's mom aware that PCP is out of the office for today. She says that pt is out of inhaler Rx. But is okay on other medications.   1. Inhaler 2. Zyrtec 3. Singular    Mom Tresa EndoKelly would like a confirmation CB at [phone] if possible once Rx are sent.

## 2016-01-09 NOTE — Telephone Encounter (Signed)
Spoke to the mom to inform prescriptions are being sent in as requested.

## 2016-01-11 NOTE — Telephone Encounter (Signed)
I am afraid we do not have samples. If they can get [facility] a [facility] they can be much cheaper. Not sure what else I can do

## 2016-01-11 NOTE — Telephone Encounter (Signed)
Pt mom called stating that pt has medicaid in [place] and they were told Dr. Abner GreenspanBlyth is not approved for Medicaid. She wants to know if there is anything we can do. Do we have samples?   Call back at [phone]

## 2016-01-11 NOTE — Telephone Encounter (Signed)
Please advise.//AB/CMA 

## 2016-01-12 NOTE — Telephone Encounter (Signed)
Spoke to the mom and she states inhaler has been taken care of for now.

## 2016-01-31 ENCOUNTER — Other Ambulatory Visit: Payer: Self-pay | Admitting: Family Medicine

## 2016-08-10 ENCOUNTER — Ambulatory Visit (INDEPENDENT_AMBULATORY_CARE_PROVIDER_SITE_OTHER): Payer: BLUE CROSS/BLUE SHIELD | Admitting: Family Medicine

## 2016-08-10 ENCOUNTER — Encounter: Payer: Self-pay | Admitting: Family Medicine

## 2016-08-10 ENCOUNTER — Telehealth: Payer: Self-pay | Admitting: Family Medicine

## 2016-08-10 VITALS — BP 110/80 | HR 73 | Temp 98.2°F | Wt 169.4 lb

## 2016-08-10 DIAGNOSIS — Z7289 Other problems related to lifestyle: Secondary | ICD-10-CM

## 2016-08-10 DIAGNOSIS — Z0000 Encounter for general adult medical examination without abnormal findings: Secondary | ICD-10-CM | POA: Diagnosis not present

## 2016-08-10 DIAGNOSIS — E782 Mixed hyperlipidemia: Secondary | ICD-10-CM | POA: Diagnosis not present

## 2016-08-10 DIAGNOSIS — Z789 Other specified health status: Secondary | ICD-10-CM

## 2016-08-10 DIAGNOSIS — T7840XD Allergy, unspecified, subsequent encounter: Secondary | ICD-10-CM

## 2016-08-10 DIAGNOSIS — G4700 Insomnia, unspecified: Secondary | ICD-10-CM

## 2016-08-10 DIAGNOSIS — I10 Essential (primary) hypertension: Secondary | ICD-10-CM

## 2016-08-10 LAB — COMPREHENSIVE METABOLIC PANEL
ALBUMIN: 4.2 g/dL (ref 3.5–5.2)
ALT: 31 U/L (ref 0–35)
AST: 23 U/L (ref 0–37)
Alkaline Phosphatase: 53 U/L (ref 39–117)
BUN: 11 mg/dL (ref 6–23)
CALCIUM: 9.2 mg/dL (ref 8.4–10.5)
CHLORIDE: 105 meq/L (ref 96–112)
CO2: 27 meq/L (ref 19–32)
CREATININE: 0.77 mg/dL (ref 0.40–1.20)
GFR: 95.79 mL/min (ref >60.00)
Glucose, Bld: 93 mg/dL (ref 70–99)
POTASSIUM: 4.1 meq/L (ref 3.5–5.1)
SODIUM: 138 meq/L (ref 135–145)
Total Bilirubin: 0.5 mg/dL (ref 0.2–1.2)
Total Protein: 7.5 g/dL (ref 6.0–8.3)

## 2016-08-10 LAB — CBC
HEMATOCRIT: 45.4 % (ref 36.0–46.0)
Hemoglobin: 15.4 g/dL — ABNORMAL HIGH (ref 12.0–15.0)
MCHC: 33.9 g/dL (ref 30.0–36.0)
MCV: 87.8 fl (ref 78.0–100.0)
Platelets: 233 10*3/uL (ref 150.0–400.0)
RBC: 5.18 Mil/uL — AB (ref 3.87–5.11)
RDW: 14.2 % (ref 11.5–15.5)
WBC: 11.3 10*3/uL — ABNORMAL HIGH (ref 4.0–10.5)

## 2016-08-10 LAB — LIPID PANEL
CHOL/HDL RATIO: 3
CHOLESTEROL: 211 mg/dL — AB (ref 0–200)
HDL: 67 mg/dL (ref >39.00)
LDL CALC: 112 mg/dL — AB (ref 0–99)
NonHDL: 144.41
TRIGLYCERIDES: 160 mg/dL — AB (ref 0.0–149.0)
VLDL: 32 mg/dL (ref 0.0–40.0)

## 2016-08-10 LAB — TSH: TSH: 3.32 u[IU]/mL (ref 0.35–4.50)

## 2016-08-10 MED ORDER — CETIRIZINE HCL 10 MG PO TABS: ORAL_TABLET | ORAL | 3 refills | Status: DC

## 2016-08-10 MED ORDER — RANITIDINE HCL 300 MG PO CAPS: 300.0000 mg | ORAL_CAPSULE | Freq: Every evening | ORAL | 5 refills | Status: DC

## 2016-08-10 NOTE — Assessment & Plan Note (Signed)
Encouraged heart healthy diet, increase exercise, avoid trans fats, consider a krill oil cap daily 

## 2016-08-10 NOTE — Assessment & Plan Note (Signed)
Uses Zyrtec daily and can take a second dose as needed. Has not needed Singulair or Flonase

## 2016-08-10 NOTE — Patient Instructions (Addendum)
Try exercising during the day for better sleep Try Melatonin 40m   Preventive Care 18-39 Years, Female Preventive care refers to lifestyle choices and visits with your health care provider that can promote health and wellness. What does preventive care include?  A yearly physical exam. This is also called an annual well check.  Dental exams once or twice a year.  Routine eye exams. Ask your health care provider how often you should have your eyes checked.  Personal lifestyle choices, including:  Daily care of your teeth and gums.  Regular physical activity.  Eating a healthy diet.  Avoiding tobacco and drug use.  Limiting alcohol use.  Practicing safe sex.  Taking vitamin and mineral supplements as recommended by your health care provider. What happens during an annual well check? The services and screenings done by your health care provider during your annual well check will depend on your age, overall health, lifestyle risk factors, and family history of disease. Counseling  Your health care provider may ask you questions about your:  Alcohol use.  Tobacco use.  Drug use.  Emotional well-being.  Home and relationship well-being.  Sexual activity.  Eating habits.  Work and work eStatistician  Method of birth control.  Menstrual cycle.  Pregnancy history. Screening  You may have the following tests or measurements:  Height, weight, and BMI.  Diabetes screening. This is done by checking your blood sugar (glucose) after you have not eaten for a while (fasting).  Blood pressure.  Lipid and cholesterol levels. These may be checked every 5 years starting at age 26  Skin check.  Hepatitis C blood test.  Hepatitis B blood test.  Sexually transmitted disease (STD) testing.  BRCA-related cancer screening. This may be done if you have a family history of breast, ovarian, tubal, or peritoneal cancers.  Pelvic exam and Pap test. This may be done every  3 years starting at age 26 Starting at age 179 this may be done every 5 years if you have a Pap test in combination with an HPV test. Discuss your test results, treatment options, and if necessary, the need for more tests with your health care provider. Vaccines  Your health care provider may recommend certain vaccines, such as:  Influenza vaccine. This is recommended every year.  Tetanus, diphtheria, and acellular pertussis (Tdap, Td) vaccine. You may need a Td booster every 10 years.  Varicella vaccine. You may need this if you have not been vaccinated.  HPV vaccine. If you are 267or younger, you may need three doses over 6 months.  Measles, mumps, and rubella (MMR) vaccine. You may need at least one dose of MMR. You may also need a second dose.  Pneumococcal 13-valent conjugate (PCV13) vaccine. You may need this if you have certain conditions and were not previously vaccinated.  Pneumococcal polysaccharide (PPSV23) vaccine. You may need one or two doses if you smoke cigarettes or if you have certain conditions.  Meningococcal vaccine. One dose is recommended if you are age 23-21 years and a first-year college student living in a residence hall, or if you have one of several medical conditions. You may also need additional booster doses.  Hepatitis A vaccine. You may need this if you have certain conditions or if you travel or work in places where you may be exposed to hepatitis A.  Hepatitis B vaccine. You may need this if you have certain conditions or if you travel or work in places where you may be exposed to  hepatitis B.  Haemophilus influenzae type b (Hib) vaccine. You may need this if you have certain risk factors. Talk to your health care provider about which screenings and vaccines you need and how often you need them. This information is not intended to replace advice given to you by your health care provider. Make sure you discuss any questions you have with your health care  provider. Document Released: 10/16/2001 Document Revised: 05/09/2016 Document Reviewed: 06/21/2015 Elsevier Interactive Patient Education  2017 [place].  Insomnia Insomnia is a sleep disorder that makes it difficult to fall asleep or to stay asleep. Insomnia can cause tiredness (fatigue), low energy, difficulty concentrating, mood swings, and poor performance at work or school. There are three different ways to classify insomnia:  Difficulty falling asleep.  Difficulty staying asleep.  Waking up too early in the morning. Any type of insomnia can be long-term (chronic) or short-term (acute). Both are common. Short-term insomnia usually lasts for three months or less. Chronic insomnia occurs at least three times a week for longer than three months. What are the causes? Insomnia may be caused by another condition, situation, or substance, such as:  Anxiety.  Certain medicines.  Gastroesophageal reflux disease (GERD) or other gastrointestinal conditions.  Asthma or other breathing conditions.  Restless legs syndrome, sleep apnea, or other sleep disorders.  Chronic pain.  Menopause. This may include hot flashes.  Stroke.  Abuse of alcohol, tobacco, or illegal drugs.  Depression.  Caffeine.  Neurological disorders, such as Alzheimer disease.  An overactive thyroid (hyperthyroidism). The cause of insomnia may not be known. What increases the risk? Risk factors for insomnia include:  Gender. Women are more commonly affected than men.  Age. Insomnia is more common as you get older.  Stress. This may involve your professional or personal life.  Income. Insomnia is more common in people with lower income.  Lack of exercise.  Irregular work schedule or night shifts.  Traveling between different time zones. What are the signs or symptoms? If you have insomnia, trouble falling asleep or trouble staying asleep is the main symptom. This may lead to other symptoms,  such as:  Feeling fatigued.  Feeling nervous about going to sleep.  Not feeling rested in the morning.  Having trouble concentrating.  Feeling irritable, anxious, or depressed. How is this treated? Treatment for insomnia depends on the cause. If your insomnia is caused by an underlying condition, treatment will focus on addressing the condition. Treatment may also include:  Medicines to help you sleep.  Counseling or therapy.  Lifestyle adjustments. Follow these instructions at home:  Take medicines only as directed by your health care provider.  Keep regular sleeping and waking hours. Avoid naps.  Keep a sleep diary to help you and your health care provider figure out what could be causing your insomnia. Include:  When you sleep.  When you wake up during the night.  How well you sleep.  How rested you feel the next day.  Any side effects of medicines you are taking.  What you eat and drink.  Make your bedroom a comfortable place where it is easy to fall asleep:  Put up shades or special blackout curtains to block light from outside.  Use a white noise machine to block noise.  Keep the temperature cool.  Exercise regularly as directed by your health care provider. Avoid exercising right before bedtime.  Use relaxation techniques to manage stress. Ask your health care provider to suggest some techniques that may  work well for you. These may include:  Breathing exercises.  Routines to release muscle tension.  Visualizing peaceful scenes.  Cut back on alcohol, caffeinated beverages, and cigarettes, especially close to bedtime. These can disrupt your sleep.  Do not overeat or eat spicy foods right before bedtime. This can lead to digestive discomfort that can make it hard for you to sleep.  Limit screen use before bedtime. This includes:  Watching TV.  Using your smartphone, tablet, and computer.  Stick to a routine. This can help you fall asleep faster.  Try to do a quiet activity, brush your teeth, and go to bed at the same time each night.  Get out of bed if you are still awake after 15 minutes of trying to sleep. Keep the lights down, but try reading or doing a quiet activity. When you feel sleepy, go back to bed.  Make sure that you drive carefully. Avoid driving if you feel very sleepy.  Keep all follow-up appointments as directed by your health care provider. This is important. Contact a health care provider if:  You are tired throughout the day or have trouble in your daily routine due to sleepiness.  You continue to have sleep problems or your sleep problems get worse. Get help right away if:  You have serious thoughts about hurting yourself or someone else. This information is not intended to replace advice given to you by your health care provider. Make sure you discuss any questions you have with your health care provider. Document Released: 08/17/2000 Document Revised: 01/20/2016 Document Reviewed: 05/21/2014 Elsevier Interactive Patient Education  2017 [employer].

## 2016-08-10 NOTE — Progress Notes (Signed)
Pre visit review using our clinic review tool, if applicable. No additional management support is needed unless otherwise documented below in the visit note. 

## 2016-08-10 NOTE — Telephone Encounter (Signed)
[facility] [place]. Patient informed

## 2016-08-10 NOTE — Telephone Encounter (Signed)
OK to rx Ranitidine 300 mg cap 1 cap po qhs prn reflux. Disp #30 with 5 rf

## 2016-08-10 NOTE — Telephone Encounter (Signed)
Patient called stating that she was seen today and forgot to mention to Dr. Abner GreenspanBlyth that she wanted to try some Zantac to take before bed. She mentioned it to Zella BallRobin but she must have forgotten to mention it to Dr. Abner GreenspanBlyth too. She would like to know if she could get a prescription called into the pharmacy. Please advise.   [id number] [place], [place] - [address]   **Patient also states that the ZYRTEC needs to go to this pharmacy as well, not [facility] in [place]**

## 2016-08-10 NOTE — Progress Notes (Signed)
Patient ID: Cynthia Salazar, female   DOB: 17-Dec-1990, 25 y.o.   MRN: [id number]   Subjective:    Patient ID: Cynthia Salazar, female    DOB: 17-Dec-1990, 25 y.o.   MRN: [id number]  No chief complaint on file.   HPI Patient is in today for annual exam patient c/o having trouble falling asleep and staying asleep. She does note over all feeling much better since she stopped drinking alcohol and is home from rehab. She is due to get her drivers license back soon. She is working now at [employer] and enjoys her job. She has her paps with Dr Henderson CloudHorvath. Denies CP/palp/SOB/HA/congestion/fevers/GI or GU c/o. Taking meds as prescribed  Past Medical History:  Diagnosis Date  . Abnormal liver function test 03/18/2013  . Alcohol use 06/03/2015  . Allergy   . Asthma   . Asthma 05/29/2012  . Asthma 05/29/2012  . Chicken pox as a child  . Depression 89 yrs old  . Depression with anxiety   . Essential hypertension 06/03/2015  . Insomnia 03/17/2013  . Other and unspecified hyperlipidemia 03/18/2013  . Overweight 04/05/2014  . Preventative health care 05/28/2012    Past Surgical History:  Procedure Laterality Date  . WISDOM TOOTH EXTRACTION  19 yrs    Family History  Problem Relation Age of Onset  . Nephrolithiasis Father   . Hypertension Father   . Nephrolithiasis Brother   . COPD Paternal Grandmother     Social History   Social History  . Marital status: Single    Spouse name: N/A  . Number of children: N/A  . Years of education: N/A   Occupational History  . Not on file.   Social History Main Topics  . Smoking status: Former Games developermoker  . Smokeless tobacco: Never Used     Comment: occasionally smoked one  . Alcohol use Yes     Comment: occasionally  . Drug use: No  . Sexual activity: Yes    Partners: Female   Other Topics Concern  . Not on file   Social History Narrative  . No narrative on file    Outpatient Medications Prior to Visit  Medication Sig Dispense Refill  .  albuterol (PROAIR HFA) 108 (90 Base) MCG/ACT inhaler INHALE 2 PUFFS INTO THE LUNGS EVERY 4 (FOUR) HOURS AS NEEDED. 1 each 11  . cetirizine (ZYRTEC) 10 MG tablet TAKE 1 TABLET BY MOUTH DAILY AS NEEDED FOR ALLERGIES 90 tablet 1  . cetirizine (ZYRTEC) 10 MG tablet TAKE 1 TABLET BY MOUTH DAILY AS NEEDED FOR ALLERGIES 90 tablet 3  . lisinopril (PRINIVIL,ZESTRIL) 10 MG tablet Take 1 tablet (10 mg total) by mouth 2 (two) times daily. 180 tablet 3  . metoprolol (LOPRESSOR) 50 MG tablet Take 1 tablet (50 mg total) by mouth 2 (two) times daily. 180 tablet 1  . montelukast (SINGULAIR) 10 MG tablet Take 1 tablet (10 mg total) by mouth at bedtime. 90 tablet 3  . triamcinolone cream (KENALOG) 0.1 % Apply 1 application topically at bedtime as needed. 80 g 01   No facility-administered medications prior to visit.     Allergies  Allergen Reactions  . Aleve [Naproxen Sodium]     wheeze    Review of Systems  Constitutional: Negative for fever.  Eyes: Negative for blurred vision.  Respiratory: Negative for cough and shortness of breath.   Cardiovascular: Negative for chest pain and palpitations.  Gastrointestinal: Negative for vomiting.  Musculoskeletal: Negative for back pain.  Skin: Negative for rash.  Neurological: Negative for loss of consciousness and headaches.       Objective:    Physical Exam  Constitutional: She is oriented to person, place, and time. She appears well-developed and well-nourished. No distress.  HENT:  Head: Normocephalic and atraumatic.  Eyes: Conjunctivae are normal.  Neck: Normal range of motion. No thyromegaly present.  Cardiovascular: Normal rate and regular rhythm.   Pulmonary/Chest: Effort normal and breath sounds normal. She has no wheezes.  Abdominal: Soft. Bowel sounds are normal. There is no tenderness.  Musculoskeletal: Normal range of motion. She exhibits no edema or deformity.  Neurological: She is alert and oriented to person, place, and time.  Skin: Skin  is warm and dry. She is not diaphoretic.  Psychiatric: She has a normal mood and affect.    BP 110/80 (BP Location: Left Arm, Patient Position: Sitting, Cuff Size: Normal)   Pulse 73   Temp 98.2 F (36.8 C) (Oral)   Wt 169 lb 6.4 oz (76.8 kg)   SpO2 96%   BMI 32.01 kg/m  Wt Readings from Last 3 Encounters:  08/10/16 169 lb 6.4 oz (76.8 kg)  08/09/15 165 lb (74.8 kg)  07/01/15 160 lb 2 oz (72.6 kg)     Lab Results  Component Value Date   WBC 11.3 (H) 08/10/2016   HGB 15.4 (H) 08/10/2016   HCT 45.4 08/10/2016   PLT 233.0 08/10/2016   GLUCOSE 93 08/10/2016   CHOL 211 (H) 08/10/2016   TRIG 160.0 (H) 08/10/2016   HDL 67.00 08/10/2016   LDLDIRECT 154.0 08/09/2015   LDLCALC 112 (H) 08/10/2016   ALT 31 08/10/2016   AST 23 08/10/2016   NA 138 08/10/2016   K 4.1 08/10/2016   CL 105 08/10/2016   CREATININE 0.77 08/10/2016   BUN 11 08/10/2016   CO2 27 08/10/2016   TSH 3.32 08/10/2016    Lab Results  Component Value Date   TSH 3.32 08/10/2016   Lab Results  Component Value Date   WBC 11.3 (H) 08/10/2016   HGB 15.4 (H) 08/10/2016   HCT 45.4 08/10/2016   MCV 87.8 08/10/2016   PLT 233.0 08/10/2016   Lab Results  Component Value Date   NA 138 08/10/2016   K 4.1 08/10/2016   CO2 27 08/10/2016   GLUCOSE 93 08/10/2016   BUN 11 08/10/2016   CREATININE 0.77 08/10/2016   BILITOT 0.5 08/10/2016   ALKPHOS 53 08/10/2016   AST 23 08/10/2016   ALT 31 08/10/2016   PROT 7.5 08/10/2016   ALBUMIN 4.2 08/10/2016   CALCIUM 9.2 08/10/2016   GFR 95.79 08/10/2016   Lab Results  Component Value Date   CHOL 211 (H) 08/10/2016   Lab Results  Component Value Date   HDL 67.00 08/10/2016   Lab Results  Component Value Date   LDLCALC 112 (H) 08/10/2016   Lab Results  Component Value Date   TRIG 160.0 (H) 08/10/2016   Lab Results  Component Value Date   CHOLHDL 3 08/10/2016   No results found for: HGBA1C    I acted as a Neurosurgeon for Dr. Abner Greenspan. Princess,  RMA  Assessment & Plan:   Problem List Items Addressed This Visit    Preventative health care - Primary    Patient encouraged to maintain heart healthy diet, regular exercise, adequate sleep. Consider daily probiotics. Take medications as prescribed      Relevant Orders   CBC (Completed)   Comprehensive metabolic panel (Completed)   Lipid panel (Completed)   TSH (  Completed)   Allergic state    Uses Zyrtec daily and can take a second dose as needed. Has not needed Singulair or Flonase       Hyperlipidemia, mixed    Encouraged heart healthy diet, increase exercise, avoid trans fats, consider a krill oil cap daily      Essential hypertension    Improved without meds since she stopped drinking alcohol      Relevant Orders   CBC (Completed)   Comprehensive metabolic panel (Completed)   TSH (Completed)   Alcohol use (HCC)    Is home from rehab and doing well. Will be getting her license back soon.          I have discontinued Ms. Gomes's metoprolol, triamcinolone cream, lisinopril, and montelukast. I am also having her maintain her albuterol, cetirizine, and cetirizine.  Meds ordered this encounter  Medications  . cetirizine (ZYRTEC) 10 MG tablet    Sig: TAKE 1 TABLET BY MOUTH DAILY AS NEEDED FOR ALLERGIES    Dispense:  90 tablet    Refill:  3   CMA functioned as scribe during this visit. History, physical and plan were performed by me.   Danise EdgeBLYTH, Lyza Houseworth, MD

## 2016-09-02 NOTE — Assessment & Plan Note (Signed)
Encouraged good sleep hygiene such as dark, quiet room. No blue/green glowing lights such as computer screens in bedroom. No alcohol or stimulants in evening. Cut down on caffeine as able. Regular exercise is helpful but not just prior to bed time.  

## 2016-09-02 NOTE — Assessment & Plan Note (Signed)
Patient encouraged to maintain heart healthy diet, regular exercise, adequate sleep. Consider daily probiotics. Take medications as prescribed 

## 2016-09-02 NOTE — Assessment & Plan Note (Signed)
Improved without meds since she stopped drinking alcohol

## 2016-09-02 NOTE — Assessment & Plan Note (Signed)
Is home from rehab and doing well. Will be getting her license back soon.

## 2016-10-09 LAB — HM PAP SMEAR: HM Pap smear: NEGATIVE

## 2016-10-10 LAB — HM PAP SMEAR: HM Pap smear: NEGATIVE

## 2016-10-16 ENCOUNTER — Telehealth: Payer: Self-pay

## 2016-10-16 ENCOUNTER — Encounter: Payer: Self-pay | Admitting: Family Medicine

## 2016-10-16 NOTE — Telephone Encounter (Signed)
Spoke with Herbert SetaHeather at [facility] [phone]). Advised that the latest Pap Smear results was needed. The fax number of [phone] was given in which to receive the incoming fax (pap results).

## 2017-01-01 ENCOUNTER — Other Ambulatory Visit: Payer: Self-pay | Admitting: Family Medicine

## 2017-01-02 ENCOUNTER — Ambulatory Visit (INDEPENDENT_AMBULATORY_CARE_PROVIDER_SITE_OTHER): Payer: BLUE CROSS/BLUE SHIELD | Admitting: Family Medicine

## 2017-01-02 ENCOUNTER — Other Ambulatory Visit (HOSPITAL_COMMUNITY)
Admission: RE | Admit: 2017-01-02 | Discharge: 2017-01-02 | Disposition: A | Discharge status: Home / Self Care | Payer: BLUE CROSS/BLUE SHIELD | Source: Ambulatory Visit | Attending: Family Medicine | Admitting: Family Medicine

## 2017-01-02 ENCOUNTER — Encounter: Payer: Self-pay | Admitting: Family Medicine

## 2017-01-02 VITALS — BP 131/85 | HR 102 | Temp 98.5°F | Resp 16 | Ht 61.0 in | Wt 169.8 lb

## 2017-01-02 DIAGNOSIS — L298 Other pruritus: Secondary | ICD-10-CM | POA: Insufficient documentation

## 2017-01-02 DIAGNOSIS — J4520 Mild intermittent asthma, uncomplicated: Secondary | ICD-10-CM

## 2017-01-02 DIAGNOSIS — N898 Other specified noninflammatory disorders of vagina: Secondary | ICD-10-CM

## 2017-01-02 DIAGNOSIS — R300 Dysuria: Secondary | ICD-10-CM

## 2017-01-02 LAB — POC URINALSYSI DIPSTICK (AUTOMATED)
Bilirubin, UA: NEGATIVE
Blood, UA: NEGATIVE
GLUCOSE UA: NEGATIVE
Ketones, UA: NEGATIVE
Leukocytes, UA: NEGATIVE
Nitrite, UA: NEGATIVE
Protein, UA: NEGATIVE
UROBILINOGEN UA: 0.2 U/dL
pH, UA: 6 (ref 5.0–8.0)

## 2017-01-02 MED ORDER — ALBUTEROL SULFATE HFA 108 (90 BASE) MCG/ACT IN AERS: INHALATION_SPRAY | RESPIRATORY_TRACT | 5 refills | Status: DC

## 2017-01-02 MED ORDER — MONTELUKAST SODIUM 10 MG PO TABS: 10.0000 mg | ORAL_TABLET | Freq: Every day | ORAL | 5 refills | Status: DC

## 2017-01-02 NOTE — Progress Notes (Signed)
Chief Complaint  Patient presents with  . Dysuria  . Vaginal Itching  . Asthma    would like refill on albuterol inhaler and would like to ask about starting back on singulair    Cynthia Salazar is a 26 y.o. female here for possible UTI.  Duration: 2 days. Symptoms: dysuria and vaginal itching Denies: urinary frequency, hematuria, urinary hesitancy, urinary retention, fever, nausea, vomiting and flank pain, vaginal discharge Hx of recurrent UTI? No- pt has never had UTI or yeast infection + new sexual partner (female) over past couple mo. Is not interested in pelvic exam today.  ROS:  Constitutional: denies fever GU: As noted in HPI MSK: Denies back pain Abd: Denies constipation or abdominal pain  Past Medical History:  Diagnosis Date  . Abnormal liver function test 03/18/2013  . Alcohol use 06/03/2015  . Allergy   . Asthma   . Asthma 05/29/2012  . Asthma 05/29/2012  . Chicken pox as a child  . Depression 24 yrs old  . Depression with anxiety   . Essential hypertension 06/03/2015  . Insomnia 03/17/2013  . Other and unspecified hyperlipidemia 03/18/2013  . Overweight 04/05/2014  . Preventative health care 05/28/2012   Family History  Problem Relation Age of Onset  . Nephrolithiasis Father   . Hypertension Father   . Nephrolithiasis Brother   . COPD Paternal Grandmother    Social History   Social History  . Marital status: Single   Social History Main Topics  . Smoking status: Former Games developer  . Smokeless tobacco: Never Used     Comment: occasionally smoked one  . Alcohol use Yes     Comment: occasionally  . Drug use: No  . Sexual activity: Yes    Partners: Female    BP 131/85 (BP Location: Right Arm, Cuff Size: Normal)   Pulse (!) 102   Temp 98.5 F (36.9 C) (Oral)   Resp 16   Ht  (1.549 m)   Wt 169 lb 12.8 oz (77 kg)   LMP 12/09/2016   SpO2 96%   BMI 32.08 kg/m  General: Awake, alert, appears stated age HEENT: MMM Heart: RRR, no murmurs Lungs:  CTAB, normal respiratory effort, no accessory muscle usage Abd: BS+, soft, NT, ND, no masses or organomegaly MSK: No CVA tenderness, neg Lloyd's sign Psych: Age appropriate judgment and insight  Dysuria - Plan: POCT Urinalysis Dipstick (Automated), Urine Culture  Vaginal itching - Plan: Urine cytology ancillary only  Mild intermittent asthma without complication - Plan: albuterol (PROAIR HFA) 108 (90 Base) MCG/ACT inhaler, montelukast (SINGULAIR) 10 MG tablet  Orders as above. UA neg. Will culture. Will also check for STI's and other causes of vag irritation. Tx pending above for now.  Refill asthma supplies. Pollen is a flare, will give Singulair as she was on this before and it was helpful.  Fu prn otherwise. The patient voiced understanding and agreement to the plan.  Jilda Roche [place], DO 01/02/17 2:07 PM

## 2017-01-02 NOTE — Progress Notes (Signed)
Pre visit review using our clinic review tool, if applicable. No additional management support is needed unless otherwise documented below in the visit note. 

## 2017-01-02 NOTE — Telephone Encounter (Signed)
[facility] [place] dated [facility]01[facility]17. Pt seen in office today and [facility] [place]. Left message for pt to call us back or send mychart message letting us know which location Rx should have gone to.

## 2017-01-02 NOTE — Patient Instructions (Signed)
Let us know if you start having flank pain, fevers, or worsening symptoms or seek care.  We will be in contact with you regarding the results of your urine tests.

## 2017-01-03 ENCOUNTER — Other Ambulatory Visit: Payer: Self-pay | Admitting: Family Medicine

## 2017-01-04 LAB — URINE CULTURE

## 2017-01-04 LAB — URINE CYTOLOGY ANCILLARY ONLY
Chlamydia: NEGATIVE
NEISSERIA GONORRHEA: NEGATIVE
TRICH (WINDOWPATH): NEGATIVE

## 2017-01-07 ENCOUNTER — Telehealth: Payer: Self-pay | Admitting: Family Medicine

## 2017-01-07 LAB — URINE CYTOLOGY ANCILLARY ONLY
BACTERIAL VAGINITIS: POSITIVE — AB
CANDIDA VAGINITIS: NEGATIVE

## 2017-01-07 MED ORDER — METRONIDAZOLE 500 MG PO TABS: 500.0000 mg | ORAL_TABLET | Freq: Two times a day (BID) | ORAL | 0 refills | Status: AC

## 2017-01-07 NOTE — Telephone Encounter (Signed)
Let pt know she has an infection of the vaginal region called bacterial vaginitis. We will call in 7 days of an antibiotic to treat this. Do not drink alcohol while on this medicine. TY.

## 2017-01-07 NOTE — Telephone Encounter (Signed)
Pt have been informed though MyChart and medication has been sent in.//AB/CMA

## 2017-02-12 ENCOUNTER — Ambulatory Visit: Payer: BLUE CROSS/BLUE SHIELD | Admitting: Family Medicine

## 2017-10-31 ENCOUNTER — Encounter: Payer: Self-pay | Admitting: Family Medicine

## 2017-10-31 ENCOUNTER — Ambulatory Visit: Payer: BLUE CROSS/BLUE SHIELD | Admitting: Family Medicine

## 2017-10-31 DIAGNOSIS — J4520 Mild intermittent asthma, uncomplicated: Secondary | ICD-10-CM | POA: Diagnosis not present

## 2017-10-31 DIAGNOSIS — K219 Gastro-esophageal reflux disease without esophagitis: Secondary | ICD-10-CM | POA: Diagnosis not present

## 2017-10-31 DIAGNOSIS — E782 Mixed hyperlipidemia: Secondary | ICD-10-CM

## 2017-10-31 DIAGNOSIS — N920 Excessive and frequent menstruation with regular cycle: Secondary | ICD-10-CM | POA: Diagnosis not present

## 2017-10-31 DIAGNOSIS — M545 Low back pain: Secondary | ICD-10-CM

## 2017-10-31 DIAGNOSIS — Z789 Other specified health status: Secondary | ICD-10-CM | POA: Diagnosis not present

## 2017-10-31 DIAGNOSIS — T7840XD Allergy, unspecified, subsequent encounter: Secondary | ICD-10-CM | POA: Diagnosis not present

## 2017-10-31 DIAGNOSIS — Z7289 Other problems related to lifestyle: Secondary | ICD-10-CM

## 2017-10-31 MED ORDER — RANITIDINE HCL 300 MG PO CAPS: 300.0000 mg | ORAL_CAPSULE | Freq: Every evening | ORAL | 5 refills | Status: DC

## 2017-10-31 MED ORDER — SPRINTEC 28 0.25-35 MG-MCG PO TABS: 1.0000 | ORAL_TABLET | Freq: Every day | ORAL | 5 refills | Status: DC

## 2017-10-31 NOTE — Assessment & Plan Note (Signed)
Doing well on current meds no recent flares

## 2017-10-31 NOTE — Assessment & Plan Note (Signed)
Has had a recent flare Avoid offending foods, start probiotics. Do not eat large meals in late evening and consider raising head of bed. Restarted Ranitidine 300 mg po qhs prn.

## 2017-10-31 NOTE — Assessment & Plan Note (Signed)
Has done well for several years with only a beverage on saturdays

## 2017-10-31 NOTE — Patient Instructions (Addendum)
Probiotics help decrease, multistrain, such NOW company 1 cap daily at [facility] or [employer]  Culturelle, [facility] multistrain options  [employer], [facility], [employer] CoverMyMeds Food Choices for Gastroesophageal Reflux Disease, Adult When you have gastroesophageal reflux disease (GERD), the foods you eat and your eating habits are very important. Choosing the right foods can help ease your discomfort. What guidelines do I need to follow?  Choose fruits, vegetables, whole grains, and low-fat dairy products.  Choose low-fat meat, fish, and poultry.  Limit fats such as oils, salad dressings, butter, nuts, and avocado.  Keep a food diary. This helps you identify foods that cause symptoms.  Avoid foods that cause symptoms. These may be different for everyone.  Eat small meals often instead of 3 large meals a day.  Eat your meals slowly, in a place where you are relaxed.  Limit fried foods.  Cook foods using methods other than frying.  Avoid drinking alcohol.  Avoid drinking large amounts of liquids with your meals.  Avoid bending over or lying down until 2-3 hours after eating. What foods are not recommended? These are some foods and drinks that may make your symptoms worse: Vegetables Tomatoes. Tomato juice. Tomato and spaghetti sauce. Chili peppers. Onion and garlic. Horseradish. Fruits Oranges, grapefruit, and lemon (fruit and juice). Meats High-fat meats, fish, and poultry. This includes hot dogs, ribs, ham, sausage, salami, and bacon. Dairy Whole milk and chocolate milk. Sour cream. Cream. Butter. Ice cream. Cream cheese. Drinks Coffee and tea. Bubbly (carbonated) drinks or energy drinks. Condiments Hot sauce. Barbecue sauce. Sweets/Desserts Chocolate and cocoa. Donuts. Peppermint and spearmint. Fats and Oils High-fat foods. This includes [place] fries and potato chips. Other Vinegar. Strong spices. This includes black pepper, white pepper,  red pepper, cayenne, curry powder, cloves, ginger, and chili powder. The items listed above may not be a complete list of foods and drinks to avoid. Contact your dietitian for more information. This information is not intended to replace advice given to you by your health care provider. Make sure you discuss any questions you have with your health care provider. Document Released: 02/19/2012 Document Revised: 01/26/2016 Document Reviewed: 06/24/2013 Elsevier Interactive Patient Education  2017 [employer]

## 2017-10-31 NOTE — Progress Notes (Signed)
Subjective:  I acted as a Education administrator for [employer]. Cynthia Salazar, [place]   Patient ID: Cynthia Salazar, female    DOB: 1991/07/28, 28 y.o.   MRN: [id number]  Chief Complaint  Patient presents with  . Medication Refill    HPI  Patient is in today for medication refill and she mostly notes feeling well. She does endorse a recent worsening of heartburn. It is worst when she eats late and lies down. No change in bowels or recent febrile or acute GI illness. She continues to only drink a small amount of alcohol once a week. Denies CP/palp/SOB/HA/congestion/fevers/GI or GU c/o. Taking meds as prescribed  Patient Care Team: Mosie Lukes, MD as PCP - General (Family Medicine)   Past Medical History:  Diagnosis Date  . Abnormal liver function test 03/18/2013  . Alcohol use 06/03/2015  . Allergy   . Asthma   . Asthma 05/29/2012  . Asthma 05/29/2012  . Chicken pox as a child  . Depression 30 yrs old  . Depression with anxiety   . Essential hypertension 06/03/2015  . Insomnia 03/17/2013  . Other and unspecified hyperlipidemia 03/18/2013  . Overweight 04/05/2014  . Preventative health care 05/28/2012    Past Surgical History:  Procedure Laterality Date  . WISDOM TOOTH EXTRACTION  19 yrs    Family History  Problem Relation Age of Onset  . Nephrolithiasis Father   . Hypertension Father   . Nephrolithiasis Brother   . COPD Paternal Grandmother     Social History   Socioeconomic History  . Marital status: Single    Spouse name: Not on file  . Number of children: Not on file  . Years of education: Not on file  . Highest education level: Not on file  Social Needs  . Financial resource strain: Not on file  . Food insecurity - worry: Not on file  . Food insecurity - inability: Not on file  . Transportation needs - medical: Not on file  . Transportation needs - non-medical: Not on file  Occupational History  . Not on file  Tobacco Use  . Smoking status: Former Research scientist (life sciences)  . Smokeless tobacco:  Never Used  . Tobacco comment: occasionally smoked one  Substance and Sexual Activity  . Alcohol use: Yes    Comment: occasionally  . Drug use: No  . Sexual activity: Yes    Partners: Female  Other Topics Concern  . Not on file  Social History Narrative  . Not on file    Outpatient Medications Prior to Visit  Medication Sig Dispense Refill  . albuterol (PROAIR HFA) 108 (90 Base) MCG/ACT inhaler INHALE 2 PUFFS INTO THE LUNGS EVERY 4 (FOUR) HOURS AS NEEDED. 1 each 5  . cetirizine (ZYRTEC) 10 MG tablet TAKE 1 TABLET BY MOUTH DAILY AS NEEDED FOR ALLERGIES 90 tablet 1  . PROAIR HFA 108 (90 Base) MCG/ACT inhaler INHALE 2 PUFFS INTO THE LUNGS EVERY 4 (FOUR) HOURS AS NEEDED. 8 Inhaler 0  . PROAIR HFA 108 (90 Base) MCG/ACT inhaler INHALE 2 PUFFS INTO THE LUNGS EVERY 4 (FOUR) HOURS AS NEEDED. 8 Inhaler 0  . ranitidine (ZANTAC) 300 MG capsule Take 1 capsule (300 mg total) by mouth every evening. 30 capsule 5  . SPRINTEC 28 0.25-35 MG-MCG tablet     . montelukast (SINGULAIR) 10 MG tablet Take 1 tablet (10 mg total) by mouth at bedtime. (Patient not taking: Reported on 10/31/2017) 30 tablet 5   No facility-administered medications prior to visit.  Allergies  Allergen Reactions  . Aleve [Naproxen Sodium]     wheeze    Review of Systems  Constitutional: Negative for fever and malaise/fatigue.  HENT: Negative for congestion.   Eyes: Negative for blurred vision.  Respiratory: Negative for shortness of breath.   Cardiovascular: Negative for chest pain, palpitations and leg swelling.  Gastrointestinal: Positive for heartburn. Negative for abdominal pain, blood in stool and nausea.  Genitourinary: Negative for dysuria and frequency.  Musculoskeletal: Positive for back pain. Negative for falls.  Skin: Negative for rash.  Neurological: Negative for dizziness, loss of consciousness and headaches.  Endo/Heme/Allergies: Negative for environmental allergies.  Psychiatric/Behavioral: Negative for  depression. The patient is not nervous/anxious.        Objective:    Physical Exam  Constitutional: She is oriented to person, place, and time. She appears well-developed and well-nourished. No distress.  HENT:  Head: Normocephalic and atraumatic.  Nose: Nose normal.  Eyes: Right eye exhibits no discharge. Left eye exhibits no discharge.  Neck: Normal range of motion. Neck supple.  Cardiovascular: Normal rate and regular rhythm.  No murmur heard. Pulmonary/Chest: Effort normal and breath sounds normal.  Abdominal: Soft. Bowel sounds are normal. There is no tenderness.  Musculoskeletal: She exhibits no edema.  Neurological: She is alert and oriented to person, place, and time.  Skin: Skin is warm and dry.  Psychiatric: She has a normal mood and affect.  Nursing note and vitals reviewed.   BP 128/76 (BP Location: Left Arm, Patient Position: Sitting, Cuff Size: Large)   Pulse 96   Temp 98.9 F (37.2 C) (Oral)   Resp 18   Ht 5' 1.02" (1.55 m)   Wt 170 lb (77.1 kg)   SpO2 97%   BMI 32.10 kg/m  Wt Readings from Last 3 Encounters:  10/31/17 170 lb (77.1 kg)  01/02/17 169 lb 12.8 oz (77 kg)  08/10/16 169 lb 6.4 oz (76.8 kg)   BP Readings from Last 3 Encounters:  10/31/17 128/76  01/02/17 131/85  08/10/16 110/80     Immunization History  Administered Date(s) Administered  . DTaP 01/23/1990, 05/01/1990, 07/29/1990, 04/23/1991, 10/24/1994  . Hepatitis B 06/10/2002, 07/15/2002, 11/18/2002  . HiB (PRP-OMP) 01/23/1990, 05/01/1990, 07/29/1990, 04/23/1991  . IPV 01/23/1990, 05/01/1990, 04/23/1991, 10/24/1994  . Influenza Split 05/28/2012  . Influenza,inj,Quad PF,6+ Mos 07/01/2015  . Influenza-Unspecified 05/04/2014  . MMR 04/23/1991, 10/24/1994  . Tdap 05/28/2012    Health Maintenance  Topic Date Due  . INFLUENZA VACCINE  07/03/2018 (Originally 04/03/2017)  . PAP SMEAR  10/10/2019  . TETANUS/TDAP  05/28/2022  . HIV Screening  Completed    Lab Results  Component Value  Date   WBC 11.3 (H) 08/10/2016   HGB 15.4 (H) 08/10/2016   HCT 45.4 08/10/2016   PLT 233.0 08/10/2016   GLUCOSE 93 08/10/2016   CHOL 211 (H) 08/10/2016   TRIG 160.0 (H) 08/10/2016   HDL 67.00 08/10/2016   LDLDIRECT 154.0 08/09/2015   LDLCALC 112 (H) 08/10/2016   ALT 31 08/10/2016   AST 23 08/10/2016   NA 138 08/10/2016   K 4.1 08/10/2016   CL 105 08/10/2016   CREATININE 0.77 08/10/2016   BUN 11 08/10/2016   CO2 27 08/10/2016   TSH 3.32 08/10/2016    Lab Results  Component Value Date   TSH 3.32 08/10/2016   Lab Results  Component Value Date   WBC 11.3 (H) 08/10/2016   HGB 15.4 (H) 08/10/2016   HCT 45.4 08/10/2016   MCV 87.8 08/10/2016  PLT 233.0 08/10/2016   Lab Results  Component Value Date   NA 138 08/10/2016   K 4.1 08/10/2016   CO2 27 08/10/2016   GLUCOSE 93 08/10/2016   BUN 11 08/10/2016   CREATININE 0.77 08/10/2016   BILITOT 0.5 08/10/2016   ALKPHOS 53 08/10/2016   AST 23 08/10/2016   ALT 31 08/10/2016   PROT 7.5 08/10/2016   ALBUMIN 4.2 08/10/2016   CALCIUM 9.2 08/10/2016   GFR 95.79 08/10/2016   Lab Results  Component Value Date   CHOL 211 (H) 08/10/2016   Lab Results  Component Value Date   HDL 67.00 08/10/2016   Lab Results  Component Value Date   LDLCALC 112 (H) 08/10/2016   Lab Results  Component Value Date   TRIG 160.0 (H) 08/10/2016   Lab Results  Component Value Date   CHOLHDL 3 08/10/2016   No results found for: HGBA1C       Assessment & Plan:   Problem List Items Addressed This Visit    Allergic state    Doing well on current meds no recent flares      Mild intermittent asthma    Well controlled on current meds no changes      Hyperlipidemia, mixed    Encouraged heart healthy diet, increase exercise, avoid trans fats, consider a krill oil cap daily      Low back pain    Encouraged moist heat and gentle stretching as tolerated. May try NSAIDs and prescription meds as directed and report if symptoms worsen or  seek immediate care      Alcohol use ([place])    Has done well for several years with only a beverage on saturdays      Reflux involving intestinal tract    Has had a recent flare Avoid offending foods, start probiotics. Do not eat large meals in late evening and consider raising head of bed. Restarted Ranitidine 300 mg po qhs prn.       Heavy menstrual bleeding    Well controlled on OCP, refill given today         I have changed [facility] 28. I am also having her maintain her cetirizine, PROAIR HFA, albuterol, montelukast, PROAIR HFA, and ranitidine.  Meds ordered this encounter  Medications  . SPRINTEC 28 0.25-35 MG-MCG tablet    Sig: Take 1 tablet by mouth daily.    Dispense:  1 Package    Refill:  5  . ranitidine (ZANTAC) 300 MG capsule    Sig: Take 1 capsule (300 mg total) by mouth every evening.    Dispense:  30 capsule    Refill:  5    CMA served as scribe during this visit. History, Physical and Plan performed by medical provider. Documentation and orders reviewed and attested to.  Penni Homans, MD

## 2017-10-31 NOTE — Assessment & Plan Note (Signed)
Well controlled on current meds no changes 

## 2017-10-31 NOTE — Assessment & Plan Note (Signed)
Encouraged heart healthy diet, increase exercise, avoid trans fats, consider a krill oil cap daily 

## 2017-10-31 NOTE — Assessment & Plan Note (Signed)
Well controlled on OCP, refill given today

## 2017-10-31 NOTE — Assessment & Plan Note (Addendum)
Encouraged moist heat and gentle stretching as tolerated. May try NSAIDs and prescription meds as directed and report if symptoms worsen or seek immediate care. Try Lidocaine  Patches prn.

## 2017-12-09 ENCOUNTER — Encounter: Payer: Self-pay | Admitting: Family Medicine

## 2018-04-14 ENCOUNTER — Other Ambulatory Visit: Payer: Self-pay | Admitting: Family Medicine

## 2018-05-06 ENCOUNTER — Ambulatory Visit: Payer: BLUE CROSS/BLUE SHIELD | Admitting: Family Medicine

## 2018-05-06 ENCOUNTER — Encounter: Payer: Self-pay | Admitting: Family Medicine

## 2018-05-06 VITALS — BP 140/100 | HR 118 | Temp 98.8°F | Resp 18 | Ht 61.0 in | Wt 164.2 lb

## 2018-05-06 DIAGNOSIS — R7989 Other specified abnormal findings of blood chemistry: Secondary | ICD-10-CM

## 2018-05-06 DIAGNOSIS — H9202 Otalgia, left ear: Secondary | ICD-10-CM | POA: Diagnosis not present

## 2018-05-06 DIAGNOSIS — E782 Mixed hyperlipidemia: Secondary | ICD-10-CM

## 2018-05-06 DIAGNOSIS — Z23 Encounter for immunization: Secondary | ICD-10-CM | POA: Diagnosis not present

## 2018-05-06 DIAGNOSIS — Z789 Other specified health status: Secondary | ICD-10-CM

## 2018-05-06 DIAGNOSIS — K219 Gastro-esophageal reflux disease without esophagitis: Secondary | ICD-10-CM | POA: Diagnosis not present

## 2018-05-06 DIAGNOSIS — G4700 Insomnia, unspecified: Secondary | ICD-10-CM

## 2018-05-06 DIAGNOSIS — Z7289 Other problems related to lifestyle: Secondary | ICD-10-CM

## 2018-05-06 DIAGNOSIS — F418 Other specified anxiety disorders: Secondary | ICD-10-CM | POA: Diagnosis not present

## 2018-05-06 DIAGNOSIS — R945 Abnormal results of liver function studies: Secondary | ICD-10-CM | POA: Diagnosis not present

## 2018-05-06 MED ORDER — BUPROPION HCL ER (XL) 150 MG PO TB24: 150.0000 mg | ORAL_TABLET | Freq: Every day | ORAL | 3 refills | Status: DC

## 2018-05-06 MED ORDER — ESCITALOPRAM OXALATE 10 MG PO TABS: 10.0000 mg | ORAL_TABLET | Freq: Every day | ORAL | 3 refills | Status: DC

## 2018-05-06 NOTE — Assessment & Plan Note (Addendum)
Check labs today. resutls show normal live functions

## 2018-05-06 NOTE — Assessment & Plan Note (Signed)
Encouraged heart healthy diet, increase exercise, avoid trans fats, consider a krill oil cap daily 

## 2018-05-06 NOTE — Patient Instructions (Addendum)
GoodRx or COverMyMeds can compare prices [employer], [employer] and the [employer] [place] often have best cash pay price  Eustachian Tube Dysfunction The eustachian tube connects the middle ear to the back of the nose. It regulates air pressure in the middle ear by allowing air to move between the ear and nose. It also helps to drain fluid from the middle ear space. When the eustachian tube does not function properly, air pressure, fluid, or both can build up in the middle ear. Eustachian tube dysfunction can affect one or both ears. What are the causes? This condition happens when the eustachian tube becomes blocked or cannot open normally. This may result from:  Ear infections.  Colds and other upper respiratory infections.  Allergies.  Irritation, such as from cigarette smoke or acid from the stomach coming up into the esophagus (gastroesophageal reflux).  Sudden changes in air pressure, such as from descending in an airplane.  Abnormal growths in the nose or throat, such as nasal polyps, tumors, or enlarged tissue at the back of the throat (adenoids).  What increases the risk? This condition may be more likely to develop in people who smoke and people who are overweight. Eustachian tube dysfunction may also be more likely to develop in children, especially children who have:  Certain birth defects of the mouth, such as cleft palate.  Large tonsils and adenoids.  What are the signs or symptoms? Symptoms of this condition may include:  A feeling of fullness in the ear.  Ear pain.  Clicking or popping noises in the ear.  Ringing in the ear.  Hearing loss.  Loss of balance.  Symptoms may get worse when the air pressure around you changes, such as when you travel to an area of high elevation or fly on an airplane. How is this diagnosed? This condition may be diagnosed based on:  Your symptoms.  A physical exam of your ear, nose, and throat.  Tests, such as those that  measure: ? The movement of your eardrum (tympanogram). ? Your hearing (audiometry).  How is this treated? Treatment depends on the cause and severity of your condition. If your symptoms are mild, you may be able to relieve your symptoms by moving air into ("popping") your ears. If you have symptoms of fluid in your ears, treatment may include:  Decongestants.  Antihistamines.  Nasal sprays or ear drops that contain medicines that reduce swelling (steroids).  In some cases, you may need to have a procedure to drain the fluid in your eardrum (myringotomy). In this procedure, a small tube is placed in the eardrum to:  Drain the fluid.  Restore the air in the middle ear space.  Follow these instructions at home:  Take over-the-counter and prescription medicines only as told by your health care provider.  Use techniques to help pop your ears as recommended by your health care provider. These may include: ? Chewing gum. ? Yawning. ? Frequent, forceful swallowing. ? Closing your mouth, holding your nose closed, and gently blowing as if you are trying to blow air out of your nose.  Do not do any of the following until your health care provider approves: ? Travel to high altitudes. ? Fly in airplanes. ? Work in a Estate agent or room. ? Scuba dive.  Keep your ears dry. Dry your ears completely after showering or bathing.  Do not smoke.  Keep all follow-up visits as told by your health care provider. This is important. Contact a health care provider  if:  Your symptoms do not go away after treatment.  Your symptoms come back after treatment.  You are unable to pop your ears.  You have: ? A fever. ? Pain in your ear. ? Pain in your head or neck. ? Fluid draining from your ear.  Your hearing suddenly changes.  You become very dizzy.  You lose your balance. This information is not intended to replace advice given to you by your health care provider. Make sure you  discuss any questions you have with your health care provider. Document Released: 09/16/2015 Document Revised: 01/26/2016 Document Reviewed: 09/08/2014 Elsevier Interactive Patient Education  [employer]2018 Elsevier Inc.

## 2018-05-07 LAB — LIPID PANEL
CHOL/HDL RATIO: 3
Cholesterol: 207 mg/dL — ABNORMAL HIGH (ref 0–200)
HDL: 80.1 mg/dL (ref >39.00)
LDL CALC: 92 mg/dL (ref 0–99)
NonHDL: 126.69
TRIGLYCERIDES: 173 mg/dL — AB (ref 0.0–149.0)
VLDL: 34.6 mg/dL (ref 0.0–40.0)

## 2018-05-07 LAB — CBC WITH DIFFERENTIAL/PLATELET
Basophils Absolute: 0.1 10*3/uL (ref 0.0–0.1)
Basophils Relative: 0.5 % (ref 0.0–3.0)
EOS PCT: 0.7 % (ref 0.0–5.0)
Eosinophils Absolute: 0.1 10*3/uL (ref 0.0–0.7)
HCT: 43.6 % (ref 36.0–46.0)
Hemoglobin: 14.9 g/dL (ref 12.0–15.0)
LYMPHS ABS: 1.4 10*3/uL (ref 0.7–4.0)
Lymphocytes Relative: 14.7 % (ref 12.0–46.0)
MCHC: 34.2 g/dL (ref 30.0–36.0)
MCV: 90.1 fl (ref 78.0–100.0)
Monocytes Absolute: 0.6 10*3/uL (ref 0.1–1.0)
Monocytes Relative: 6.5 % (ref 3.0–12.0)
NEUTROS ABS: 7.5 10*3/uL (ref 1.4–7.7)
NEUTROS PCT: 77.6 % — AB (ref 43.0–77.0)
PLATELETS: 274 10*3/uL (ref 150.0–400.0)
RBC: 4.85 Mil/uL (ref 3.87–5.11)
RDW: 15.6 % — AB (ref 11.5–15.5)
WBC: 9.6 10*3/uL (ref 4.0–10.5)

## 2018-05-07 LAB — COMPREHENSIVE METABOLIC PANEL
ALT: 29 U/L (ref 0–35)
AST: 36 U/L (ref 0–37)
Albumin: 4.3 g/dL (ref 3.5–5.2)
Alkaline Phosphatase: 31 U/L — ABNORMAL LOW (ref 39–117)
BUN: 9 mg/dL (ref 6–23)
CALCIUM: 9.4 mg/dL (ref 8.4–10.5)
CO2: 25 meq/L (ref 19–32)
CREATININE: 0.97 mg/dL (ref 0.40–1.20)
Chloride: 103 mEq/L (ref 96–112)
GFR: 72.45 mL/min (ref >60.00)
GLUCOSE: 89 mg/dL (ref 70–99)
Potassium: 4 mEq/L (ref 3.5–5.1)
Sodium: 138 mEq/L (ref 135–145)
Total Bilirubin: 1.1 mg/dL (ref 0.2–1.2)
Total Protein: 7.2 g/dL (ref 6.0–8.3)

## 2018-05-07 LAB — TSH: TSH: 3.1 u[IU]/mL (ref 0.35–4.50)

## 2018-05-11 NOTE — Assessment & Plan Note (Deleted)
Has had a short relapse

## 2018-05-11 NOTE — Assessment & Plan Note (Addendum)
She had a recent break up which has been very stressful. She agrees to referral to behavioral health for counseling and to restart lexapro and Wellbutrin which she used in the past with good results. Spent 25 minutes in direct patient care, counseling, developing plan of care.

## 2018-05-11 NOTE — Progress Notes (Signed)
Subjective:    Patient ID: Cynthia Salazar, female    DOB: 1991/03/31, 27 y.o.   MRN: [id number]  No chief complaint on file.   HPI Patient is in today for follow up. No recent febrile illness or hospitalization. She has been very stressed due to a recent break up and she notes anhedonia, difficulty concentrating, lability and she has been drinking a little again. No suicidal ideation. Denies CP/palp/SOB/HA/congestion/fevers/GI or GU c/o. Taking meds as prescribed. She is ready to start counseling and restart medications she has taken previously that she found helpful.   Past Medical History:  Diagnosis Date  . Abnormal liver function test 03/18/2013  . Alcohol use 06/03/2015  . Allergy   . Asthma   . Asthma 05/29/2012  . Asthma 05/29/2012  . Chicken pox as a child  . Depression 79 yrs old  . Depression with anxiety   . Essential hypertension 06/03/2015  . Insomnia 03/17/2013  . Other and unspecified hyperlipidemia 03/18/2013  . Overweight 04/05/2014  . Preventative health care 05/28/2012    Past Surgical History:  Procedure Laterality Date  . WISDOM TOOTH EXTRACTION  19 yrs    Family History  Problem Relation Age of Onset  . Nephrolithiasis Father   . Hypertension Father   . Nephrolithiasis Brother   . COPD Paternal Grandmother     Social History   Socioeconomic History  . Marital status: Single    Spouse name: Not on file  . Number of children: Not on file  . Years of education: Not on file  . Highest education level: Not on file  Occupational History  . Not on file  Social Needs  . Financial resource strain: Not on file  . Food insecurity:    Worry: Not on file    Inability: Not on file  . Transportation needs:    Medical: Not on file    Non-medical: Not on file  Tobacco Use  . Smoking status: Former Games developer  . Smokeless tobacco: Never Used  . Tobacco comment: occasionally smoked one  Substance and Sexual Activity  . Alcohol use: Yes    Comment: occasionally   . Drug use: No  . Sexual activity: Yes    Partners: Female  Lifestyle  . Physical activity:    Days per week: Not on file    Minutes per session: Not on file  . Stress: Not on file  Relationships  . Social connections:    Talks on phone: Not on file    Gets together: Not on file    Attends religious service: Not on file    Active member of club or organization: Not on file    Attends meetings of clubs or organizations: Not on file    Relationship status: Not on file  . Intimate partner violence:    Fear of current or ex partner: Not on file    Emotionally abused: Not on file    Physically abused: Not on file    Forced sexual activity: Not on file  Other Topics Concern  . Not on file  Social History Narrative  . Not on file    Outpatient Medications Prior to Visit  Medication Sig Dispense Refill  . albuterol (PROAIR HFA) 108 (90 Base) MCG/ACT inhaler INHALE 2 PUFFS INTO THE LUNGS EVERY 4 (FOUR) HOURS AS NEEDED. 1 each 5  . cetirizine (ZYRTEC) 10 MG tablet TAKE 1 TABLET BY MOUTH DAILY AS NEEDED FOR ALLERGIES 90 tablet 1  . PROAIR HFA 108 (  90 Base) MCG/ACT inhaler INHALE 2 PUFFS INTO THE LUNGS EVERY 4 (FOUR) HOURS AS NEEDED. 8 Inhaler 0  . ranitidine (ZANTAC) 300 MG capsule Take 1 capsule (300 mg total) by mouth every evening. 30 capsule 5  . SPRINTEC 28 0.25-35 MG-MCG tablet TAKE 1 TABLET BY MOUTH EVERY DAY 28 tablet 5  . montelukast (SINGULAIR) 10 MG tablet Take 1 tablet (10 mg total) by mouth at bedtime. 30 tablet 5  . PROAIR HFA 108 (90 Base) MCG/ACT inhaler INHALE 2 PUFFS INTO THE LUNGS EVERY 4 (FOUR) HOURS AS NEEDED. 8 Inhaler 0   No facility-administered medications prior to visit.     Allergies  Allergen Reactions  . Aleve [Naproxen Sodium]     wheeze    Review of Systems  Constitutional: Negative for fever and malaise/fatigue.  HENT: Negative for congestion.   Eyes: Negative for blurred vision.  Respiratory: Negative for shortness of breath.     Cardiovascular: Negative for chest pain, palpitations and leg swelling.  Gastrointestinal: Negative for abdominal pain, blood in stool and nausea.  Genitourinary: Negative for dysuria and frequency.  Musculoskeletal: Negative for falls.  Skin: Negative for rash.  Neurological: Negative for dizziness, loss of consciousness and headaches.  Endo/Heme/Allergies: Negative for environmental allergies.  Psychiatric/Behavioral: Positive for depression. Negative for suicidal ideas. The patient is nervous/anxious and has insomnia.        Objective:    Physical Exam  Constitutional: She is oriented to person, place, and time. She appears well-developed and well-nourished. No distress.  HENT:  Head: Normocephalic and atraumatic.  Nose: Nose normal.  Eyes: Right eye exhibits no discharge. Left eye exhibits no discharge.  Neck: Normal range of motion. Neck supple.  Cardiovascular: Normal rate and regular rhythm.  No murmur heard. Pulmonary/Chest: Effort normal and breath sounds normal.  Abdominal: Soft. Bowel sounds are normal. There is no tenderness.  Musculoskeletal: She exhibits no edema.  Neurological: She is alert and oriented to person, place, and time.  Skin: Skin is warm and dry.  Psychiatric: She has a normal mood and affect.  Nursing note and vitals reviewed.   BP (!) 140/100 (BP Location: Left Arm, Patient Position: Sitting, Cuff Size: Normal)   Pulse (!) 118   Temp 98.8 F (37.1 C) (Oral)   Resp 18   Ht 5\' 1"  (1.549 m)   Wt 164 lb 3.2 oz (74.5 kg)   LMP 04/24/2018   SpO2 98%   BMI 31.03 kg/m  Wt Readings from Last 3 Encounters:  05/06/18 164 lb 3.2 oz (74.5 kg)  10/31/17 170 lb (77.1 kg)  01/02/17 169 lb 12.8 oz (77 kg)     Lab Results  Component Value Date   WBC 9.6 05/06/2018   HGB 14.9 05/06/2018   HCT 43.6 05/06/2018   PLT 274.0 05/06/2018   GLUCOSE 89 05/06/2018   CHOL 207 (H) 05/06/2018   TRIG 173.0 (H) 05/06/2018   HDL 80.10 05/06/2018   LDLDIRECT  154.0 08/09/2015   LDLCALC 92 05/06/2018   ALT 29 05/06/2018   AST 36 05/06/2018   NA 138 05/06/2018   K 4.0 05/06/2018   CL 103 05/06/2018   CREATININE 0.97 05/06/2018   BUN 9 05/06/2018   CO2 25 05/06/2018   TSH 3.10 05/06/2018    Lab Results  Component Value Date   TSH 3.10 05/06/2018   Lab Results  Component Value Date   WBC 9.6 05/06/2018   HGB 14.9 05/06/2018   HCT 43.6 05/06/2018   MCV 90.1  05/06/2018   PLT 274.0 05/06/2018   Lab Results  Component Value Date   NA 138 05/06/2018   K 4.0 05/06/2018   CO2 25 05/06/2018   GLUCOSE 89 05/06/2018   BUN 9 05/06/2018   CREATININE 0.97 05/06/2018   BILITOT 1.1 05/06/2018   ALKPHOS 31 (L) 05/06/2018   AST 36 05/06/2018   ALT 29 05/06/2018   PROT 7.2 05/06/2018   ALBUMIN 4.3 05/06/2018   CALCIUM 9.4 05/06/2018   GFR 72.45 05/06/2018   Lab Results  Component Value Date   CHOL 207 (H) 05/06/2018   Lab Results  Component Value Date   HDL 80.10 05/06/2018   Lab Results  Component Value Date   LDLCALC 92 05/06/2018   Lab Results  Component Value Date   TRIG 173.0 (H) 05/06/2018   Lab Results  Component Value Date   CHOLHDL 3 05/06/2018   No results found for: HGBA1C     Assessment & Plan:   Problem List Items Addressed This Visit    Depression with anxiety    She had a recent break up which has been very stressful. She agrees to referral to behavioral health for counseling and to restart lexapro and Wellbutrin which she used in the past with good results. Spent 25 minutes in direct patient care, counseling, developing plan of care.       Relevant Medications   buPROPion (WELLBUTRIN XL) 150 MG 24 hr tablet   escitalopram (LEXAPRO) 10 MG tablet   Other Relevant Orders   Ambulatory referral to Behavioral Health   Insomnia    Encouraged good sleep hygiene such as dark, quiet room. No blue/green glowing lights such as computer screens in bedroom. No alcohol or stimulants in evening. Cut down on caffeine  as able. Regular exercise is helpful but not just prior to bed time.       Hyperlipidemia, mixed    Encouraged heart healthy diet, increase exercise, avoid trans fats, consider a krill oil cap daily      Relevant Orders   Lipid panel (Completed)   Abnormal liver function test    Check labs today. resutls show normal live functions      Relevant Orders   Comprehensive metabolic panel (Completed)   Alcohol use (HCC)   Reflux involving intestinal tract    Avoid offending foods, start probiotics. Do not eat large meals in late evening and consider raising head of bed.       Relevant Orders   TSH (Completed)    Other Visit Diagnoses    Needs flu shot    -  Primary   Relevant Orders   Flu Vaccine QUAD 6+ mos PF IM (Fluarix Quad PF) (Completed)   Ear pain, left       Relevant Orders   CBC with Differential/Platelet (Completed)      I have discontinued Morrie Sheldon Deshler's montelukast. I am also having her start on buPROPion and escitalopram. Additionally, I am having her maintain her cetirizine, PROAIR HFA, albuterol, ranitidine, and SPRINTEC 28.  Meds ordered this encounter  Medications  . buPROPion (WELLBUTRIN XL) 150 MG 24 hr tablet    Sig: Take 1 tablet (150 mg total) by mouth daily.    Dispense:  30 tablet    Refill:  3  . escitalopram (LEXAPRO) 10 MG tablet    Sig: Take 1 tablet (10 mg total) by mouth daily.    Dispense:  30 tablet    Refill:  3     Misty Stanley  Charlett Blake, MD

## 2018-05-11 NOTE — Assessment & Plan Note (Signed)
Encouraged good sleep hygiene such as dark, quiet room. No blue/green glowing lights such as computer screens in bedroom. No alcohol or stimulants in evening. Cut down on caffeine as able. Regular exercise is helpful but not just prior to bed time.  

## 2018-05-11 NOTE — Assessment & Plan Note (Signed)
Avoid offending foods, start probiotics. Do not eat large meals in late evening and consider raising head of bed.  

## 2018-05-13 LAB — HM PAP SMEAR: HM Pap smear: NEGATIVE

## 2018-06-03 ENCOUNTER — Ambulatory Visit: Payer: BLUE CROSS/BLUE SHIELD | Admitting: Psychology

## 2018-06-06 ENCOUNTER — Encounter: Payer: Self-pay | Admitting: Family Medicine

## 2018-08-05 ENCOUNTER — Ambulatory Visit (INDEPENDENT_AMBULATORY_CARE_PROVIDER_SITE_OTHER): Payer: BLUE CROSS/BLUE SHIELD | Admitting: Family Medicine

## 2018-08-05 DIAGNOSIS — K219 Gastro-esophageal reflux disease without esophagitis: Secondary | ICD-10-CM

## 2018-08-05 DIAGNOSIS — E663 Overweight: Secondary | ICD-10-CM

## 2018-08-05 DIAGNOSIS — F418 Other specified anxiety disorders: Secondary | ICD-10-CM

## 2018-08-05 DIAGNOSIS — Z7289 Other problems related to lifestyle: Secondary | ICD-10-CM

## 2018-08-05 DIAGNOSIS — Z789 Other specified health status: Secondary | ICD-10-CM

## 2018-08-05 DIAGNOSIS — R0989 Other specified symptoms and signs involving the circulatory and respiratory systems: Secondary | ICD-10-CM

## 2018-08-05 MED ORDER — ESCITALOPRAM OXALATE 20 MG PO TABS: 20.0000 mg | ORAL_TABLET | Freq: Every day | ORAL | 3 refills | Status: DC

## 2018-08-05 NOTE — Assessment & Plan Note (Signed)
Has gotten back to drinking usually 1-3 x a week, usually drinks liquor, 4-5 mini bottles. Encouraged to stop and thiamine 100 mg daily, folic acid 1 mg tab daily

## 2018-08-05 NOTE — Assessment & Plan Note (Signed)
Struggling with recent break up and is in need of an increase of Escitalopram

## 2018-08-05 NOTE — Patient Instructions (Addendum)
Encouraged increased rest and hydration, add probiotics, zinc such as Coldeze or Xicam. Treat fevers as needed. Elderberry and Vitamin C [phone] whenever you are feeling sick  Thiamine 100 mg daily abd Folic Acid 800 to 1000 mcg daily

## 2018-08-06 DIAGNOSIS — R0989 Other specified symptoms and signs involving the circulatory and respiratory systems: Secondary | ICD-10-CM | POA: Insufficient documentation

## 2018-08-06 NOTE — Progress Notes (Signed)
Subjective:    Patient ID: Cynthia Salazar, female    DOB: Dec 03, 1990, 27 y.o.   MRN: [id number]  No chief complaint on file.   HPI Patient is in today for follow-up.  She is noting increasing anhedonia and poor sleep.  She has had a recent difficult big break-up.  She is not endorsing suicidal ideation but does note she has gone back to drinking alcohol several times a week and 4-5 at a time.  She acknowledges that makes her depression worse.  No recent febrile illness or other acute complaints noted. Denies CP/palp/SOB/HA/congestion/fevers or GU c/o. Taking meds as prescribed  Past Medical History:  Diagnosis Date  . Abnormal liver function test 03/18/2013  . Alcohol use 06/03/2015  . Allergy   . Asthma   . Asthma 05/29/2012  . Asthma 05/29/2012  . Chicken pox as a child  . Depression 35 yrs old  . Depression with anxiety   . Essential hypertension 06/03/2015  . Insomnia 03/17/2013  . Other and unspecified hyperlipidemia 03/18/2013  . Overweight 04/05/2014  . Preventative health care 05/28/2012    Past Surgical History:  Procedure Laterality Date  . WISDOM TOOTH EXTRACTION  19 yrs    Family History  Problem Relation Age of Onset  . Nephrolithiasis Father   . Hypertension Father   . Nephrolithiasis Brother   . COPD Paternal Grandmother     Social History   Socioeconomic History  . Marital status: Single    Spouse name: Not on file  . Number of children: Not on file  . Years of education: Not on file  . Highest education level: Not on file  Occupational History  . Not on file  Social Needs  . Financial resource strain: Not on file  . Food insecurity:    Worry: Not on file    Inability: Not on file  . Transportation needs:    Medical: Not on file    Non-medical: Not on file  Tobacco Use  . Smoking status: Former Games developer  . Smokeless tobacco: Never Used  . Tobacco comment: occasionally smoked one  Substance and Sexual Activity  . Alcohol use: Yes    Comment:  occasionally  . Drug use: No  . Sexual activity: Yes    Partners: Female  Lifestyle  . Physical activity:    Days per week: Not on file    Minutes per session: Not on file  . Stress: Not on file  Relationships  . Social connections:    Talks on phone: Not on file    Gets together: Not on file    Attends religious service: Not on file    Active member of club or organization: Not on file    Attends meetings of clubs or organizations: Not on file    Relationship status: Not on file  . Intimate partner violence:    Fear of current or ex partner: Not on file    Emotionally abused: Not on file    Physically abused: Not on file    Forced sexual activity: Not on file  Other Topics Concern  . Not on file  Social History Narrative  . Not on file    Outpatient Medications Prior to Visit  Medication Sig Dispense Refill  . albuterol (PROAIR HFA) 108 (90 Base) MCG/ACT inhaler INHALE 2 PUFFS INTO THE LUNGS EVERY 4 (FOUR) HOURS AS NEEDED. 1 each 5  . buPROPion (WELLBUTRIN XL) 150 MG 24 hr tablet Take 1 tablet (150 mg total) by  mouth daily. 30 tablet 3  . cetirizine (ZYRTEC) 10 MG tablet TAKE 1 TABLET BY MOUTH DAILY AS NEEDED FOR ALLERGIES 90 tablet 1  . PROAIR HFA 108 (90 Base) MCG/ACT inhaler INHALE 2 PUFFS INTO THE LUNGS EVERY 4 (FOUR) HOURS AS NEEDED. 8 Inhaler 0  . SPRINTEC 28 0.25-35 MG-MCG tablet TAKE 1 TABLET BY MOUTH EVERY DAY 28 tablet 5  . escitalopram (LEXAPRO) 10 MG tablet Take 1 tablet (10 mg total) by mouth daily. 30 tablet 3  . ranitidine (ZANTAC) 300 MG capsule Take 1 capsule (300 mg total) by mouth every evening. 30 capsule 5   No facility-administered medications prior to visit.     Allergies  Allergen Reactions  . Aleve [Naproxen Sodium]     wheeze    Review of Systems  Constitutional: Positive for malaise/fatigue. Negative for fever.  HENT: Negative for congestion.   Eyes: Negative for blurred vision.  Respiratory: Negative for shortness of breath.     Cardiovascular: Negative for chest pain, palpitations and leg swelling.  Gastrointestinal: Negative for abdominal pain, blood in stool and nausea.  Genitourinary: Negative for dysuria and frequency.  Musculoskeletal: Negative for falls.  Skin: Negative for rash.  Neurological: Negative for dizziness, loss of consciousness and headaches.  Endo/Heme/Allergies: Negative for environmental allergies.  Psychiatric/Behavioral: Positive for depression and substance abuse. Negative for suicidal ideas. The patient has insomnia. The patient is not nervous/anxious.        Objective:    Physical Exam  Constitutional: She is oriented to person, place, and time. She appears well-developed and well-nourished. No distress.  HENT:  Head: Normocephalic and atraumatic.  Nose: Nose normal.  Eyes: Right eye exhibits no discharge. Left eye exhibits no discharge.  Neck: Normal range of motion. Neck supple.  Cardiovascular: Normal rate and regular rhythm.  No murmur heard. Pulmonary/Chest: Effort normal and breath sounds normal.  Abdominal: Soft. Bowel sounds are normal. There is no tenderness.  Musculoskeletal: She exhibits no edema.  Neurological: She is alert and oriented to person, place, and time.  Skin: Skin is warm and dry.  Psychiatric: She has a normal mood and affect.  Nursing note and vitals reviewed.   BP 132/90   Pulse 86   Temp 98.4 F (36.9 C) (Oral)   Resp 18   Wt 159 lb 6.4 oz (72.3 kg)   SpO2 97%   BMI 30.12 kg/m  Wt Readings from Last 3 Encounters:  08/05/18 159 lb 6.4 oz (72.3 kg)  05/06/18 164 lb 3.2 oz (74.5 kg)  10/31/17 170 lb (77.1 kg)     Lab Results  Component Value Date   WBC 9.6 05/06/2018   HGB 14.9 05/06/2018   HCT 43.6 05/06/2018   PLT 274.0 05/06/2018   GLUCOSE 89 05/06/2018   CHOL 207 (H) 05/06/2018   TRIG 173.0 (H) 05/06/2018   HDL 80.10 05/06/2018   LDLDIRECT 154.0 08/09/2015   LDLCALC 92 05/06/2018   ALT 29 05/06/2018   AST 36 05/06/2018   NA  138 05/06/2018   K 4.0 05/06/2018   CL 103 05/06/2018   CREATININE 0.97 05/06/2018   BUN 9 05/06/2018   CO2 25 05/06/2018   TSH 3.10 05/06/2018    Lab Results  Component Value Date   TSH 3.10 05/06/2018   Lab Results  Component Value Date   WBC 9.6 05/06/2018   HGB 14.9 05/06/2018   HCT 43.6 05/06/2018   MCV 90.1 05/06/2018   PLT 274.0 05/06/2018   Lab Results  Component  Value Date   NA 138 05/06/2018   K 4.0 05/06/2018   CO2 25 05/06/2018   GLUCOSE 89 05/06/2018   BUN 9 05/06/2018   CREATININE 0.97 05/06/2018   BILITOT 1.1 05/06/2018   ALKPHOS 31 (L) 05/06/2018   AST 36 05/06/2018   ALT 29 05/06/2018   PROT 7.2 05/06/2018   ALBUMIN 4.3 05/06/2018   CALCIUM 9.4 05/06/2018   GFR 72.45 05/06/2018   Lab Results  Component Value Date   CHOL 207 (H) 05/06/2018   Lab Results  Component Value Date   HDL 80.10 05/06/2018   Lab Results  Component Value Date   LDLCALC 92 05/06/2018   Lab Results  Component Value Date   TRIG 173.0 (H) 05/06/2018   Lab Results  Component Value Date   CHOLHDL 3 05/06/2018   No results found for: HGBA1C     Assessment & Plan:   Problem List Items Addressed This Visit    Depression with anxiety    Struggling with recent break up and is in need of an increase of Escitalopram      Relevant Medications   escitalopram (LEXAPRO) 20 MG tablet   Overweight    consider DASH diet, decrease po intake and increase exercise as tolerated. Needs 7-8 hours of sleep nightly. Avoid trans fats, eat small, frequent meals every 4-5 hours with lean proteins, complex carbs and healthy fats. Minimize simple carbs      Alcohol use (HCC)    Has gotten back to drinking usually 1-3 x a week, usually drinks liquor, 4-5 mini bottles. Encouraged to stop and thiamine 100 mg daily, folic acid 1 mg tab daily      Reflux involving intestinal tract    Stopped Ranitidine and consider Famotidine      Labile blood pressure    Improved on recheck but she  is reminded her blood pressure gets much harder to control when she starts drinking again so she should attempt cessation         I have discontinued Morrie SheldonAshley Thiem's ranitidine and escitalopram. I am also having her start on escitalopram. Additionally, I am having her maintain her cetirizine, PROAIR HFA, albuterol, SPRINTEC 28, and buPROPion.  Meds ordered this encounter  Medications  . escitalopram (LEXAPRO) 20 MG tablet    Sig: Take 1 tablet (20 mg total) by mouth daily.    Dispense:  30 tablet    Refill:  3     Danise EdgeStacey Caro Brundidge, MD

## 2018-08-06 NOTE — Assessment & Plan Note (Signed)
consider DASH diet, decrease po intake and increase exercise as tolerated. Needs 7-8 hours of sleep nightly. Avoid trans fats, eat small, frequent meals every 4-5 hours with lean proteins, complex carbs and healthy fats. Minimize simple carbs

## 2018-08-06 NOTE — Assessment & Plan Note (Signed)
Improved on recheck but she is reminded her blood pressure gets much harder to control when she starts drinking again so she should attempt cessation

## 2018-08-06 NOTE — Assessment & Plan Note (Signed)
Stopped Ranitidine and consider Famotidine

## 2018-08-07 MED ORDER — FAMOTIDINE 40 MG PO TABS: 40.0000 mg | ORAL_TABLET | Freq: Every day | ORAL | 5 refills | Status: DC

## 2018-08-07 NOTE — Addendum Note (Signed)
Addended by: Crissie SicklesARTER, Leelan Rajewski A on: 08/07/2018 10:56 AM   Modules accepted: Orders

## 2018-08-26 ENCOUNTER — Other Ambulatory Visit: Payer: Self-pay | Admitting: Family Medicine

## 2018-09-05 ENCOUNTER — Other Ambulatory Visit: Payer: Self-pay | Admitting: Family Medicine

## 2018-09-05 DIAGNOSIS — J4520 Mild intermittent asthma, uncomplicated: Secondary | ICD-10-CM

## 2018-09-09 ENCOUNTER — Ambulatory Visit: Payer: BLUE CROSS/BLUE SHIELD | Admitting: Psychology

## 2018-09-09 DIAGNOSIS — F1020 Alcohol dependence, uncomplicated: Secondary | ICD-10-CM | POA: Diagnosis not present

## 2018-09-09 DIAGNOSIS — F4321 Adjustment disorder with depressed mood: Secondary | ICD-10-CM | POA: Diagnosis not present

## 2018-10-09 ENCOUNTER — Other Ambulatory Visit: Payer: Self-pay | Admitting: Family Medicine

## 2018-10-14 ENCOUNTER — Ambulatory Visit: Payer: BLUE CROSS/BLUE SHIELD | Admitting: Psychology

## 2018-10-16 ENCOUNTER — Ambulatory Visit: Payer: Self-pay | Admitting: Family Medicine

## 2018-11-04 ENCOUNTER — Ambulatory Visit: Payer: BLUE CROSS/BLUE SHIELD | Admitting: Psychology

## 2018-11-11 ENCOUNTER — Ambulatory Visit: Payer: Self-pay | Admitting: Family Medicine

## 2018-11-18 ENCOUNTER — Ambulatory Visit: Payer: Self-pay | Admitting: Family Medicine

## 2019-03-13 ENCOUNTER — Other Ambulatory Visit: Payer: Self-pay

## 2019-03-16 ENCOUNTER — Other Ambulatory Visit: Payer: Self-pay

## 2019-03-16 ENCOUNTER — Telehealth: Payer: Self-pay | Admitting: Medical

## 2019-03-16 ENCOUNTER — Encounter: Payer: Self-pay | Admitting: Medical

## 2019-03-16 ENCOUNTER — Ambulatory Visit (INDEPENDENT_AMBULATORY_CARE_PROVIDER_SITE_OTHER): Payer: BC Managed Care – PPO | Admitting: Medical

## 2019-03-16 DIAGNOSIS — Z20822 Contact with and (suspected) exposure to covid-19: Secondary | ICD-10-CM

## 2019-03-16 DIAGNOSIS — J4520 Mild intermittent asthma, uncomplicated: Secondary | ICD-10-CM

## 2019-03-16 MED ORDER — BENZONATATE 100 MG PO CAPS: 100.0000 mg | ORAL_CAPSULE | Freq: Three times a day (TID) | ORAL | 0 refills | Status: DC | PRN

## 2019-03-16 MED ORDER — PREDNISONE 10 MG (21) PO TBPK: ORAL_TABLET | ORAL | 0 refills | Status: DC

## 2019-03-16 MED ORDER — ALBUTEROL SULFATE HFA 108 (90 BASE) MCG/ACT IN AERS: 2.0000 | INHALATION_SPRAY | Freq: Four times a day (QID) | RESPIRATORY_TRACT | 0 refills | Status: DC | PRN

## 2019-03-16 NOTE — Telephone Encounter (Signed)
Patient has had 2 more weeks of loose stools, cough, shortness of breath and wheezing.  Concern for possible COVID.  Could you please call patient today and get her scheduled for testing tomorrow.

## 2019-03-16 NOTE — Patient Instructions (Signed)
Patient has symptoms have been present for 2 weeks or more fatigue, loose stools/ diarrhea, dry cough, shortness of breath and wheezing.  Just this past Friday she got COVID test result which was negative but she was actually swabbed the week prior.  Patient's girlfriend has similar signs and symptoms.  Girlfriend COVID test is still pending.  Patient has been using albuterol frequently daily basis recently.  Recommend rest, hydration, bland foods and starting 6-day taper dose of prednisone.  Prescribing benzonatate for cough and recommending use albuterol if needed.  If signs and symptoms persist such as severe shortness of breath or worsening diarrhea then explained to patient might recommend work-up to the emergency department as they have rapid GI panel and  get chest x-ray to evaluate if she might have pneumonia.  Presently holding off on antibiotics as might cause GI symptoms.  Explained process of returning back to work pending [place] to call the [facility] center and have her hopefully test tomorrow.  Work note excuse will be sent to her MyChart account.  Follow-up by my chart message this Thursday but also follow-up virtual visit a week from today.

## 2019-03-16 NOTE — Progress Notes (Signed)
Subjective:    Patient ID: Cynthia Salazar, female    DOB: 11/10/1990, 28 y.o.   MRN: [id number]  HPI  Virtual Visit via Video Note  I connected with Cynthia Salazar on 03/16/19 at  1:40 PM EDT by a video enabled telemedicine application and verified that I am speaking with the correct person using two identifiers.  Location: Patient: home Provider: office.     I discussed the limitations of evaluation and management by telemedicine and the availability of in person appointments. The patient expressed understanding and agreed to proceed.  History of Present Illness:  On patient's scheduling note it states she has had vomiting, diarrhea, fatigue and fever.  Early in the month more than 1 week ago she tested negative for COVID.  Some shortness of breath as well.  Pt covid test just came back Friday and was swabbed one week before.  Pt states through  the entire course of illness she has been sob/wheezing. Use albuterol inhaler within 2 week period.   She states severity of her symptoms wax and wane.  Pt states at times she got tested for covid had been sick for about one week.  [facility].   Pt girlfriend got tested for covid and her symptoms gi/loose stools but also  with some sob.  [place] [employer]23rd - 26 [place]  Pt stats just dry cough.   Pt states number of loose stools presently up to 5 a day.   Observations/Objective: General-no acute distress, pleasant, oriented. Lungs- on inspection lungs appear unlabored. Neck- no tracheal deviation or jvd on inspection. Neuro- gross motor function appears intact.   Assessment and Plan: Patient has symptoms have been present for 2 weeks or more fatigue, loose stools/ diarrhea, dry cough, shortness of breath and wheezing.  Just this past Friday she got COVID test result which was negative but she was actually swabbed the week prior.  Patient's girlfriend has similar signs and symptoms.   Girlfriend COVID test is still pending.  Patient has been using albuterol frequently daily basis recently.  Recommend rest, hydration, bland foods and starting 6-day taper dose of prednisone.  Prescribing benzonatate for cough and recommending use albuterol if needed.  If signs and symptoms persist such as severe shortness of breath or worsening diarrhea then explained to patient might recommend work-up to the emergency department as they have rapid GI panel and  get chest x-ray to evaluate if she might have pneumonia.  Presently holding off on antibiotics as might cause GI symptoms.  Explained process of returning back to work pending COVID test.  [facility] tomorrow.  Work note excuse will be sent to her MyChart account.  Follow-up by my chart message this Thursday but also follow-up virtual visit a week from today.  Follow Up Instructions:    I discussed the assessment and treatment plan with the patient. The patient was provided an opportunity to ask questions and all were answered. The patient agreed with the plan and demonstrated an understanding of the instructions.   The patient was advised to call back or seek an in-person evaluation if the symptoms worsen or if the condition fails to improve as anticipated.  I provided 25  minutes of non-face-to-face time during this encounter.   Esperanza RichtersEdward Kaled Allende, PA-C    Review of Systems  Constitutional: Positive for fatigue and fever. Negative for chills.  HENT: Negative for congestion and  sore throat.   Respiratory: Positive for cough, shortness of breath and wheezing.   Gastrointestinal: Positive for diarrhea. Negative for abdominal pain, blood in stool, constipation and rectal pain.  Musculoskeletal: Negative for back pain, myalgias and neck pain.  Skin: Negative for rash.  Neurological: Negative for dizziness, weakness, light-headedness and headaches.  Hematological: Negative for  adenopathy. Does not bruise/bleed easily.  Psychiatric/Behavioral: Negative for behavioral problems and confusion.    Past Medical History:  Diagnosis Date  . Abnormal liver function test 03/18/2013  . Alcohol use 06/03/2015  . Allergy   . Asthma   . Asthma 05/29/2012  . Asthma 05/29/2012  . Chicken pox as a child  . Depression 28 yrs old  . Depression with anxiety   . Essential hypertension 06/03/2015  . Insomnia 03/17/2013  . Other and unspecified hyperlipidemia 03/18/2013  . Overweight 04/05/2014  . Preventative health care 05/28/2012     Social History   Socioeconomic History  . Marital status: Single    Spouse name: Not on file  . Number of children: Not on file  . Years of education: Not on file  . Highest education level: Not on file  Occupational History  . Not on file  Social Needs  . Financial resource strain: Not on file  . Food insecurity    Worry: Not on file    Inability: Not on file  . Transportation needs    Medical: Not on file    Non-medical: Not on file  Tobacco Use  . Smoking status: Former Games developermoker  . Smokeless tobacco: Never Used  . Tobacco comment: occasionally smoked one  Substance and Sexual Activity  . Alcohol use: Yes    Comment: occasionally  . Drug use: No  . Sexual activity: Yes    Partners: Female  Lifestyle  . Physical activity    Days per week: Not on file    Minutes per session: Not on file  . Stress: Not on file  Relationships  . Social Musicianconnections    Talks on phone: Not on file    Gets together: Not on file    Attends religious service: Not on file    Active member of club or organization: Not on file    Attends meetings of clubs or organizations: Not on file    Relationship status: Not on file  . Intimate partner violence    Fear of current or ex partner: Not on file    Emotionally abused: Not on file    Physically abused: Not on file    Forced sexual activity: Not on file  Other Topics Concern  . Not on file  Social  History Narrative  . Not on file    Past Surgical History:  Procedure Laterality Date  . WISDOM TOOTH EXTRACTION  19 yrs    Family History  Problem Relation Age of Onset  . Nephrolithiasis Father   . Hypertension Father   . Nephrolithiasis Brother   . COPD Paternal Grandmother     Allergies  Allergen Reactions  . Aleve [Naproxen Sodium]     wheeze    Current Outpatient Medications on File Prior to Visit  Medication Sig Dispense Refill  . albuterol (PROAIR HFA) 108 (90 Base) MCG/ACT inhaler INHALE 2 PUFFS INTO THE LUNGS EVERY 4 (FOUR) HOURS AS NEEDED. 8.5 Inhaler 5  . buPROPion (WELLBUTRIN XL) 150 MG 24 hr tablet TAKE 1 TABLET BY MOUTH EVERY DAY 30 tablet 3  . cetirizine (ZYRTEC) 10 MG tablet TAKE 1  TABLET BY MOUTH DAILY AS NEEDED FOR ALLERGIES 90 tablet 1  . escitalopram (LEXAPRO) 10 MG tablet TAKE 1 TABLET BY MOUTH EVERY DAY 30 tablet 3  . escitalopram (LEXAPRO) 20 MG tablet TAKE 1 TABLET BY MOUTH EVERY DAY 90 tablet 2  . famotidine (PEPCID) 40 MG tablet Take 1 tablet (40 mg total) by mouth daily. 30 tablet 5  . PROAIR HFA 108 (90 Base) MCG/ACT inhaler INHALE 2 PUFFS INTO THE LUNGS EVERY 4 (FOUR) HOURS AS NEEDED. 8 Inhaler 0  . SPRINTEC 28 0.25-35 MG-MCG tablet TAKE 1 TABLET BY MOUTH EVERY DAY 28 tablet 5   No current facility-administered medications on file prior to visit.     BP 135/88   Pulse 100   Temp (!) 96.6 F (35.9 C) (Oral)       Objective:   Physical Exam        Assessment & Plan:

## 2019-03-16 NOTE — Telephone Encounter (Signed)
Contacted pt and she has been scheduled for tomorrow 03/17/19 at the [facility] testing site for 10:15. Pt is aware to wear a mask and remain in her car. Pt understood and had no additional questions at this time. Nothing further is needed   Ordered was placed.

## 2019-03-17 ENCOUNTER — Other Ambulatory Visit: Payer: Self-pay

## 2019-03-17 DIAGNOSIS — Z20822 Contact with and (suspected) exposure to covid-19: Secondary | ICD-10-CM

## 2019-03-21 LAB — NOVEL CORONAVIRUS, NAA: SARS-CoV-2, NAA: NOT DETECTED

## 2019-03-24 ENCOUNTER — Encounter: Payer: Self-pay | Admitting: Medical

## 2019-03-24 ENCOUNTER — Ambulatory Visit (INDEPENDENT_AMBULATORY_CARE_PROVIDER_SITE_OTHER): Payer: BC Managed Care – PPO | Admitting: Medical

## 2019-03-24 ENCOUNTER — Other Ambulatory Visit: Payer: Self-pay

## 2019-03-24 VITALS — BP 140/96 | HR 108

## 2019-03-24 DIAGNOSIS — F418 Other specified anxiety disorders: Secondary | ICD-10-CM | POA: Diagnosis not present

## 2019-03-24 DIAGNOSIS — F1010 Alcohol abuse, uncomplicated: Secondary | ICD-10-CM

## 2019-03-24 DIAGNOSIS — R195 Other fecal abnormalities: Secondary | ICD-10-CM | POA: Diagnosis not present

## 2019-03-24 DIAGNOSIS — R5383 Other fatigue: Secondary | ICD-10-CM | POA: Diagnosis not present

## 2019-03-24 MED ORDER — SPRINTEC 28 0.25-35 MG-MCG PO TABS: 1.0000 | ORAL_TABLET | Freq: Every day | ORAL | 5 refills | Status: DC

## 2019-03-24 MED ORDER — AMLODIPINE BESYLATE 2.5 MG PO TABS: 2.5000 mg | ORAL_TABLET | Freq: Every day | ORAL | 0 refills | Status: DC

## 2019-03-24 NOTE — Patient Instructions (Addendum)
Patient's had 2- cover test in the past month.  I do not think clinically her signs and symptoms represent COVID.  Also she never had any close contact with known positive person with COVID.  For fatigue that has been chronic and recently worsened, will get future CBC, CMP, TSH, T4, B1, B12, and vitamin D.  Patient has admitted alcohol abuse and will include GGT in the future labs.   We will follow labs and see if correctable cause for fatigue.  Did advise patient to try to cut back significantly on alcohol or stop.  In the past patient did go to rehab and was briefly successful per her report.  For history of depression, advised her to get back on her SSRI.  She has been noncompliant in the past encouraged her to take daily.  Loose stools mild intermittent over the last month.  I doubt this represents COVID as both her test were negative.  Will put future order gastro panel.  She might have some IBS as well.  Refilled her OCP she takes for cycle control.  Follow-up date to be determined after lab review.   I did counsel patient today that I do not think there is need for repeat COVID testing.  But between now and her future scheduled labs that she has any new or changing signs and symptoms please notify us.

## 2019-03-24 NOTE — Progress Notes (Signed)
Subjective:    Patient ID: Cynthia Salazar, female    DOB: September 16, 1990, 28 y.o.   MRN: [id number]  HPI  Virtual Visit via Video Note  I connected with Cynthia Salazar on 03/24/19 at  2:00 PM EDT by a video enabled telemedicine application and verified that I am speaking with the correct person using two identifiers.  Location: Patient: home Provider: office.   I discussed the limitations of evaluation and management by telemedicine and the availability of in person appointments. The patient expressed understanding and agreed to proceed.  History of Present Illness:   Pt covid test for last visit came back negative x 2 over past month. Pt currently feeling fatigued at this point. She had bm more frequent in past but still   4-5 loose stools today. No fever or chills. Some bodyaches that still persist but she states body aches have been present before in past before she had recent covid tests. She states that she is stressed and carries tension in her back. Admits daily alcohol abuse.  Mentioned maybe loose stools in past with depression. Some at onset ssri   Pt also notes she stopped her depression medication about 3 weeks ago around first covid testing time period.     Pt states fatigue is moderate to severe. Wants thyroid test done.  Pt has htn today and used to be on bp medication in the past.  She drinks alcohol heavily over past month or more. Drinks. About pint a day.  Pt use ocp for menstrual.   Observations/Objective: General-no acute distress, pleasant, oriented. Lungs- on inspection lungs appear unlabored. Neck- no tracheal deviation or jvd on inspection. Neuro- gross motor function appears intact.   Assessment and Plan: Patient's had 2- cover test in the past month.  I do not think clinically her signs and symptoms represent COVID.  Also she never had any close contact with known positive person with COVID.  For fatigue that has been chronic and recently  worsened, will get future CBC, CMP, TSH, T4, B1, B12, and vitamin D.  Patient has admitted alcohol abuse and will include GGT in the future labs.   We will follow labs and see if correctable cause for fatigue.  Did advise patient to try to cut back significantly on alcohol or stop.  In the past patient did go to rehab and was briefly successful per her report.  For history of depression, advised her to get back on her SSRI.  She has been noncompliant in the past encouraged her to take daily.  Loose stools mild intermittent over the last month.  I doubt this represents COVID as both her test were negative.  Will put future order gastro panel.  She might have some IBS as well.  Refilled her OCP she takes for cycle control.  Follow-up date to be determined after lab review.  25+ minutes spent with patient today.  50% of time spent counseling patient on approach to work-up for her conditions discussed today.  Follow Up Instructions:    I discussed the assessment and treatment plan with the patient. The patient was provided an opportunity to ask questions and all were answered. The patient agreed with the plan and demonstrated an understanding of the instructions.   The patient was advised to call back or seek an in-person evaluation if the symptoms worsen or if the condition fails to improve as anticipated.  I provided 25 minutes of non-face-to-face time during this encounter.   Mackie Pai, PA-C  Review of Systems     Objective:   Physical Exam        Assessment & Plan:

## 2019-03-26 ENCOUNTER — Other Ambulatory Visit: Payer: Self-pay

## 2019-03-26 ENCOUNTER — Other Ambulatory Visit (INDEPENDENT_AMBULATORY_CARE_PROVIDER_SITE_OTHER): Payer: BC Managed Care – PPO

## 2019-03-26 DIAGNOSIS — R5383 Other fatigue: Secondary | ICD-10-CM | POA: Diagnosis not present

## 2019-03-26 DIAGNOSIS — F1010 Alcohol abuse, uncomplicated: Secondary | ICD-10-CM | POA: Diagnosis not present

## 2019-03-26 NOTE — Addendum Note (Signed)
Addended by: Kelle Darting A on: 03/26/2019 03:12 PM   Modules accepted: Orders

## 2019-03-26 NOTE — Progress Notes (Signed)
Unable to obtain CBC w/diff and vitamin B1 today. Pt will return on 03/31/19 for remaining labs.

## 2019-03-27 ENCOUNTER — Other Ambulatory Visit: Payer: BC Managed Care – PPO

## 2019-03-27 ENCOUNTER — Telehealth: Payer: Self-pay | Admitting: Medical

## 2019-03-27 DIAGNOSIS — R739 Hyperglycemia, unspecified: Secondary | ICD-10-CM

## 2019-03-27 DIAGNOSIS — R7989 Other specified abnormal findings of blood chemistry: Secondary | ICD-10-CM

## 2019-03-27 LAB — COMPREHENSIVE METABOLIC PANEL
ALT: 60 U/L — ABNORMAL HIGH (ref 0–35)
AST: 73 U/L — ABNORMAL HIGH (ref 0–37)
Albumin: 4.4 g/dL (ref 3.5–5.2)
Alkaline Phosphatase: 43 U/L (ref 39–117)
BUN: 11 mg/dL (ref 6–23)
CO2: 22 mEq/L (ref 19–32)
Calcium: 9.5 mg/dL (ref 8.4–10.5)
Chloride: 102 mEq/L (ref 96–112)
Creatinine, Ser: 0.89 mg/dL (ref 0.40–1.20)
GFR: 74.81 mL/min (ref >60.00)
Glucose, Bld: 116 mg/dL — ABNORMAL HIGH (ref 70–99)
Potassium: 3.5 mEq/L (ref 3.5–5.1)
Sodium: 137 mEq/L (ref 135–145)
Total Bilirubin: 1 mg/dL (ref 0.2–1.2)
Total Protein: 7.2 g/dL (ref 6.0–8.3)

## 2019-03-27 LAB — GAMMA GT: GGT: 255 U/L — ABNORMAL HIGH (ref 7–51)

## 2019-03-27 LAB — TSH: TSH: 4.93 u[IU]/mL — ABNORMAL HIGH (ref 0.35–4.50)

## 2019-03-27 LAB — T4, FREE: Free T4: 0.63 ng/dL (ref 0.60–1.60)

## 2019-03-27 LAB — VITAMIN B12: Vitamin B-12: 129 pg/mL — ABNORMAL LOW (ref 211–911)

## 2019-03-27 NOTE — Telephone Encounter (Signed)
Future tsh, t4 and a1c placed.

## 2019-03-30 LAB — VITAMIN D 1,25 DIHYDROXY
Vitamin D 1, 25 (OH)2 Total: 95 pg/mL — ABNORMAL HIGH (ref 18–72)
Vitamin D2 1, 25 (OH)2: <8 pg/mL
Vitamin D3 1, 25 (OH)2: 95 pg/mL

## 2019-03-31 ENCOUNTER — Other Ambulatory Visit: Payer: BC Managed Care – PPO

## 2019-04-13 ENCOUNTER — Other Ambulatory Visit: Payer: Self-pay | Admitting: Family Medicine

## 2019-04-19 ENCOUNTER — Telehealth: Payer: Self-pay | Admitting: Medical

## 2019-04-19 NOTE — Telephone Encounter (Signed)
Opened to review vit D results.

## 2019-04-21 ENCOUNTER — Other Ambulatory Visit: Payer: Self-pay | Admitting: Medical

## 2019-05-05 ENCOUNTER — Other Ambulatory Visit: Payer: Self-pay | Admitting: Medical

## 2019-05-12 ENCOUNTER — Encounter: Payer: Self-pay | Admitting: Family Medicine

## 2019-06-03 ENCOUNTER — Other Ambulatory Visit: Payer: Self-pay | Admitting: Family Medicine

## 2019-06-11 ENCOUNTER — Ambulatory Visit: Payer: BC Managed Care – PPO | Admitting: Family Medicine

## 2019-06-19 ENCOUNTER — Other Ambulatory Visit: Payer: Self-pay | Admitting: *Deleted

## 2019-06-19 MED ORDER — ALBUTEROL SULFATE HFA 108 (90 BASE) MCG/ACT IN AERS: 2.0000 | INHALATION_SPRAY | RESPIRATORY_TRACT | 5 refills | Status: DC | PRN

## 2019-06-19 NOTE — Telephone Encounter (Signed)
[facility] faxed over a request stating that patient would like to change to a lower cost option for her inhaler, it will save her 25.00, for her Ventolin inhaler.  They would like to change to Proventil generic.  New rx sent in for generic Proventil.

## 2019-06-30 ENCOUNTER — Telehealth: Payer: Self-pay

## 2019-06-30 NOTE — Telephone Encounter (Signed)
Copied from [place] [id number]. Topic: General - Other >> Jun 29, 2019 10:46 AM Celene Kras A wrote: Reason for CRM: Pt called and is requesting to know if she can start receiving b12 injections due to her thyroid issues. Please advise.    Please advise

## 2019-06-30 NOTE — Telephone Encounter (Signed)
If she is feeling tired from the thyroid the better option is to start Levothyroxine 25 mcg daily. Disp #30 with 3 rf, recheck labs in 3 months. That will help more. If she wants to try Vitamin B12 1000 mcg tabs, get sublinqual version and that absorbs best and see how that does.

## 2019-07-02 MED ORDER — LEVOTHYROXINE SODIUM 50 MCG PO TABS: 50.0000 ug | ORAL_TABLET | Freq: Every day | ORAL | 3 refills | Status: DC

## 2019-07-02 NOTE — Telephone Encounter (Signed)
Called patient and left message for patient to call the office back

## 2019-07-22 ENCOUNTER — Other Ambulatory Visit: Payer: Self-pay | Admitting: Medical

## 2019-08-14 ENCOUNTER — Other Ambulatory Visit: Payer: Self-pay | Admitting: Medical

## 2019-08-28 ENCOUNTER — Other Ambulatory Visit: Payer: Self-pay | Admitting: Family Medicine

## 2019-10-08 ENCOUNTER — Telehealth: Payer: Self-pay | Admitting: Family Medicine

## 2019-10-08 NOTE — Telephone Encounter (Signed)
Caller Name: Tyia Phone: [phone]  Medication: albuterol (PROVENTIL HFA) 108 (90 Base) MCG/ACT inhaler  Pt states this doesn't work good for her and the Proair (red container) works better and wants to have that ordered. She is out of it.   Has the patient contacted their pharmacy? Yes - told to call MD to change RX  Preferred Pharmacy (with phone number or street name):  [facility] [phone] - [place], [place] - [address] Phone:  [phone]  Fax:  [phone]

## 2019-10-09 MED ORDER — PROAIR HFA 108 (90 BASE) MCG/ACT IN AERS: INHALATION_SPRAY | RESPIRATORY_TRACT | 3 refills | Status: DC

## 2019-10-09 NOTE — Telephone Encounter (Signed)
Notify her all of them will say Albuterol but OK to send in a new prescription that says DAW for ProAir with same sig, disp #1 with 3 rf. Insurance may push back but we can try. Please let her know once done

## 2019-10-09 NOTE — Telephone Encounter (Signed)
Medication sent in patient notified  

## 2019-10-09 NOTE — Telephone Encounter (Signed)
Please advise 

## 2020-01-09 ENCOUNTER — Encounter: Payer: Self-pay | Admitting: Family Medicine

## 2020-01-13 NOTE — Progress Notes (Deleted)
[facility] [address] [place], [place] [address] [phone]  Date:  01/14/2020   Name:  Cynthia Salazar   DOB:  14-Aug-1991   MRN:  [id number]  PCP:  Bradd Canary, MD    Chief Complaint: No chief complaint on file.   History of Present Illness:  Cynthia Salazar is a 29 y.o. very pleasant female patient who presents with the following:  Primary patient of my partner Dr. Abner Greenspan, here today with concern of excessive alcohol use She has history of depression, hyperlipidemia, insomnia, asthma I have not seen her myself previously  Patient Active Problem List   Diagnosis Date Noted  . Labile blood pressure 08/06/2018  . Reflux involving intestinal tract 10/31/2017  . Heavy menstrual bleeding 10/31/2017  . Alcohol use (HCC) 06/03/2015  . Overweight 04/05/2014  . Low back pain 11/18/2013  . Hyperlipidemia, mixed 03/18/2013  . Abnormal liver function test 03/18/2013  . Insomnia 03/17/2013  . Mild intermittent asthma 05/29/2012  . Preventative health care 05/28/2012  . Depression with anxiety   . Allergic state     Past Medical History:  Diagnosis Date  . Abnormal liver function test 03/18/2013  . Alcohol use 06/03/2015  . Allergy   . Asthma   . Asthma 05/29/2012  . Asthma 05/29/2012  . Chicken pox as a child  . Depression 6 yrs old  . Depression with anxiety   . Essential hypertension 06/03/2015  . Insomnia 03/17/2013  . Other and unspecified hyperlipidemia 03/18/2013  . Overweight 04/05/2014  . Preventative health care 05/28/2012    Past Surgical History:  Procedure Laterality Date  . WISDOM TOOTH EXTRACTION  19 yrs    Social History   Tobacco Use  . Smoking status: Former Games developer  . Smokeless tobacco: Never Used  . Tobacco comment: occasionally smoked one  Substance Use Topics  . Alcohol use: Yes    Comment: occasionally  . Drug use: No    Family History  Problem Relation Age of Onset  .  Nephrolithiasis Father   . Hypertension Father   . Nephrolithiasis Brother   . COPD Paternal Grandmother     Allergies  Allergen Reactions  . Aleve [Naproxen Sodium]     wheeze    Medication list has been reviewed and updated.  Current Outpatient Medications on File Prior to Visit  Medication Sig Dispense Refill  . amLODipine (NORVASC) 2.5 MG tablet TAKE 1 TABLET BY MOUTH EVERY DAY 30 tablet 0  . benzonatate (TESSALON) 100 MG capsule Take 1 capsule (100 mg total) by mouth 3 (three) times daily as needed for cough. 30 capsule 0  . buPROPion (WELLBUTRIN XL) 150 MG 24 hr tablet TAKE 1 TABLET BY MOUTH EVERY DAY 90 tablet 2  . cetirizine (ZYRTEC) 10 MG tablet TAKE 1 TABLET BY MOUTH DAILY AS NEEDED FOR ALLERGIES 90 tablet 1  . escitalopram (LEXAPRO) 10 MG tablet TAKE 1 TABLET BY MOUTH EVERY DAY 30 tablet 3  . escitalopram (LEXAPRO) 20 MG tablet TAKE 1 TABLET BY MOUTH EVERY DAY 90 tablet 2  . famotidine (PEPCID) 40 MG tablet Take 1 tablet (40 mg total) by mouth daily. 30 tablet 5  . levothyroxine (SYNTHROID) 50 MCG tablet Take 1 tablet (50 mcg total) by mouth daily. 90 tablet 3  . predniSONE (STERAPRED UNI-PAK 21 TAB) 10 MG (21) TBPK tablet Taper as directed. 21 tablet 0  . PROAIR HFA 108 (90 Base) MCG/ACT inhaler INHALE  2 PUFFS INTO THE LUNGS EVERY 4 (FOUR) HOURS AS NEEDED. 18 g 3  . SPRINTEC 28 0.25-35 MG-MCG tablet Take 1 tablet by mouth daily. 28 tablet 5   No current facility-administered medications on file prior to visit.    Review of Systems:  As per HPI- otherwise negative.   Physical Examination: There were no vitals filed for this visit. There were no vitals filed for this visit. There is no height or weight on file to calculate BMI. Ideal Body Weight:    GEN: no acute distress. HEENT: Atraumatic, Normocephalic.  Ears and Nose: No external deformity. CV: RRR, No M/G/R. No JVD. No thrill. No extra heart sounds. PULM: CTA B, no wheezes, crackles, rhonchi. No  retractions. No resp. distress. No accessory muscle use. ABD: S, NT, ND, +BS. No rebound. No HSM. EXTR: No c/c/e PSYCH: Normally interactive. Conversant.    Assessment and Plan: *** Moderate medical decision making today  This visit occurred during the SARS-CoV-2 public health emergency.  Safety protocols were in place, including screening questions prior to the visit, additional usage of staff PPE, and extensive cleaning of exam room while observing appropriate contact time as indicated for disinfecting solutions.    Signed Lamar Blinks, MD

## 2020-01-13 NOTE — Telephone Encounter (Signed)
Attempted to call pt to schedule appt for today if she was available. No answer and voicemail is full.

## 2020-01-14 ENCOUNTER — Ambulatory Visit: Payer: BC Managed Care – PPO | Admitting: Family Medicine

## 2020-01-15 ENCOUNTER — Ambulatory Visit: Payer: BC Managed Care – PPO | Admitting: Medical

## 2020-01-15 ENCOUNTER — Other Ambulatory Visit: Payer: Self-pay

## 2020-01-15 VITALS — BP 143/103 | HR 107 | Resp 18 | Ht 61.0 in | Wt 175.0 lb

## 2020-01-15 DIAGNOSIS — I10 Essential (primary) hypertension: Secondary | ICD-10-CM | POA: Diagnosis not present

## 2020-01-15 DIAGNOSIS — F418 Other specified anxiety disorders: Secondary | ICD-10-CM | POA: Diagnosis not present

## 2020-01-15 DIAGNOSIS — F1010 Alcohol abuse, uncomplicated: Secondary | ICD-10-CM

## 2020-01-15 MED ORDER — ESCITALOPRAM OXALATE 10 MG PO TABS: 10.0000 mg | ORAL_TABLET | Freq: Every day | ORAL | 3 refills | Status: DC

## 2020-01-15 MED ORDER — DIAZEPAM 5 MG PO TABS
ORAL_TABLET | ORAL | 0 refills | Status: DC
Start: 2020-01-15 — End: 2020-06-14

## 2020-01-15 NOTE — Progress Notes (Signed)
Subjective:    Patient ID: Cynthia Salazar, female    DOB: [birth year], 28 y.o.   MRN: [id number]  HPI   Pt in for evaluation.  She wants help stopping alcohol use  Pt states use of alcohol has been problems for at least 12 years. Pt drinks alcohol every night. About pint per night.   She used to drink more than that in the past.  Pt states she started drinking at 29 yo. Then when 30 yo started drinking more when her aunt passed away. Pt has very heavy hx of alcoholism both side of family mom and dad. Many of pt dada were alcoholic.   Pt had tried AA in the past but did not like how it worked. She also attended rehab. Twice attended. States periods of abstaining from alcohol max 6 months.   Pt is going to see therapist on Tuesday.  Pt does smoke marijuana but no other drug use.   Pt used to be on wellbutrin and lexapro. Has not been on either for few months.   Last time she drank was last night.   Stopped for 2 weeks about one month ago.  lmp this week.     Review of Systems  Constitutional: Negative for chills, fatigue and fever.  Respiratory: Negative for cough, chest tightness, shortness of breath and wheezing.   Cardiovascular: Negative for chest pain and palpitations.  Gastrointestinal: Negative for abdominal pain and blood in stool.  Musculoskeletal: Negative for back pain.  Neurological: Negative for dizziness, weakness and light-headedness.  Hematological: Negative for adenopathy. Does not bruise/bleed easily.  Psychiatric/Behavioral: Positive for dysphoric mood. Negative for behavioral problems, sleep disturbance and suicidal ideas. The patient is nervous/anxious.    Past Medical History:  Diagnosis Date  . Abnormal liver function test 03/18/2013  . Alcohol use 06/03/2015  . Allergy   . Asthma   . Asthma 05/29/2012  . Asthma 05/29/2012  . Chicken pox as a child  . Depression 78 yrs old  . Depression with anxiety   . Essential hypertension 06/03/2015  .  Insomnia 03/17/2013  . Other and unspecified hyperlipidemia 03/18/2013  . Overweight 04/05/2014  . Preventative health care 05/28/2012     Social History   Socioeconomic History  . Marital status: Single    Spouse name: Not on file  . Number of children: Not on file  . Years of education: Not on file  . Highest education level: Not on file  Occupational History  . Not on file  Tobacco Use  . Smoking status: Former Research scientist (life sciences)  . Smokeless tobacco: Never Used  . Tobacco comment: occasionally smoked one  Substance and Sexual Activity  . Alcohol use: Yes    Comment: occasionally  . Drug use: No  . Sexual activity: Yes    Partners: Female  Other Topics Concern  . Not on file  Social History Narrative  . Not on file   Social Determinants of Health   Financial Resource Strain:   . Difficulty of Paying Living Expenses:   Food Insecurity:   . Worried About Charity fundraiser in the Last Year:   . Arboriculturist in the Last Year:   Transportation Needs:   . Film/video editor (Medical):   Marland Kitchen Lack of Transportation (Non-Medical):   Physical Activity:   . Days of Exercise per Week:   . Minutes of Exercise per Session:   Stress:   . Feeling of Stress :   Social Connections:   .  Frequency of Communication with Friends and Family:   . Frequency of Social Gatherings with Friends and Family:   . Attends Religious Services:   . Active Member of Clubs or Organizations:   . Attends Banker Meetings:   Marland Kitchen Marital Status:   Intimate Partner Violence:   . Fear of Current or Ex-Partner:   . Emotionally Abused:   Marland Kitchen Physically Abused:   . Sexually Abused:     Past Surgical History:  Procedure Laterality Date  . WISDOM TOOTH EXTRACTION  19 yrs    Family History  Problem Relation Age of Onset  . Nephrolithiasis Father   . Hypertension Father   . Nephrolithiasis Brother   . COPD Paternal Grandmother     Allergies  Allergen Reactions  . Aleve [Naproxen Sodium]      wheeze    Current Outpatient Medications on File Prior to Visit  Medication Sig Dispense Refill  . amLODipine (NORVASC) 2.5 MG tablet TAKE 1 TABLET BY MOUTH EVERY DAY (Patient not taking: Reported on 01/15/2020) 30 tablet 0  . benzonatate (TESSALON) 100 MG capsule Take 1 capsule (100 mg total) by mouth 3 (three) times daily as needed for cough. (Patient not taking: Reported on 01/15/2020) 30 capsule 0  . buPROPion (WELLBUTRIN XL) 150 MG 24 hr tablet TAKE 1 TABLET BY MOUTH EVERY DAY (Patient not taking: Reported on 01/15/2020) 90 tablet 2  . cetirizine (ZYRTEC) 10 MG tablet TAKE 1 TABLET BY MOUTH DAILY AS NEEDED FOR ALLERGIES (Patient not taking: Reported on 01/15/2020) 90 tablet 1  . escitalopram (LEXAPRO) 20 MG tablet TAKE 1 TABLET BY MOUTH EVERY DAY (Patient not taking: Reported on 01/15/2020) 90 tablet 2  . famotidine (PEPCID) 40 MG tablet Take 1 tablet (40 mg total) by mouth daily. (Patient not taking: Reported on 01/15/2020) 30 tablet 5  . levothyroxine (SYNTHROID) 50 MCG tablet Take 1 tablet (50 mcg total) by mouth daily. (Patient not taking: Reported on 01/15/2020) 90 tablet 3  . predniSONE (STERAPRED UNI-PAK 21 TAB) 10 MG (21) TBPK tablet Taper as directed. (Patient not taking: Reported on 01/15/2020) 21 tablet 0  . PROAIR HFA 108 (90 Base) MCG/ACT inhaler INHALE 2 PUFFS INTO THE LUNGS EVERY 4 (FOUR) HOURS AS NEEDED. (Patient not taking: Reported on 01/15/2020) 18 g 3  . SPRINTEC 28 0.25-35 MG-MCG tablet Take 1 tablet by mouth daily. (Patient not taking: Reported on 01/15/2020) 28 tablet 5   No current facility-administered medications on file prior to visit.    BP (!) 143/103 (BP Location: Left Arm, Patient Position: Sitting, Cuff Size: Large)   Pulse (!) 107   Resp 18   Ht 5\' 1"  (1.549 m)   Wt 175 lb (79.4 kg)   LMP 01/13/2020   SpO2 97%   BMI 33.07 kg/m       Objective:   Physical Exam   General- No acute distress. Pleasant patient but teary-eyed and cries a little bit as she  describes struggling with alcohol use. Lungs- Clear, even and unlabored. Heart- regular rate and rhythm. Neurologic- CNII- XII grossly intact.      Assessment & Plan:  For history of alcohol abuse, depression and anxiety, I went ahead and sent in Lexapro prescription.  Please restart this.  Also sent in tapered dose of Valium to start on Saturday as we discussed.  Very important to not drink alcohol while using Valium.  So please abstain from here on out.  Rx advisement explained/given.  Patient is going to see therapist  on Tuesday.  Remove her work note for Monday so she will be off of work from Sunday, Monday and Tuesday.  This way she will will have to drive or work while using moderate dose of Valium.  Patient blood pressure is a little bit elevated today but this might be too emotional state.  Looks like she is not using her amlodipine.  We will recheck her blood pressure on follow-up and may re start amlodipine.  Follow-up 1 week or as needed.  Time spent with patient today was 30  minutes which consisted of chart review, discussing diagnosis,  treatment and documentation.  Esperanza Richters, PA-C

## 2020-01-15 NOTE — Patient Instructions (Signed)
For history of alcohol abuse, depression and anxiety, I went ahead and sent in Lexapro prescription.  Please restart this.  Also sent in tapered dose of Valium to start on Saturday as we discussed.  Very important to not drink alcohol while using Valium.  So please abstain from here on out.  Rx advisement explained/given.  Patient is going to see therapist on Tuesday.  Remove her work note for Monday so she will be off of work from Sunday, Monday and Tuesday.  This way she will will have to drive or work while using moderate dose of Valium.  Patient blood pressure is a little bit elevated today but this might be too emotional state.  Looks like she is not using her amlodipine.  We will recheck her blood pressure on follow-up and may re start amlodipine.  Follow-up 1 week or as needed.

## 2020-01-22 ENCOUNTER — Encounter: Payer: Self-pay | Admitting: Medical

## 2020-01-22 ENCOUNTER — Other Ambulatory Visit: Payer: Self-pay

## 2020-01-22 ENCOUNTER — Ambulatory Visit: Payer: BC Managed Care – PPO | Admitting: Medical

## 2020-01-22 VITALS — BP 138/80 | HR 99 | Wt 174.8 lb

## 2020-01-22 DIAGNOSIS — F1010 Alcohol abuse, uncomplicated: Secondary | ICD-10-CM | POA: Diagnosis not present

## 2020-01-22 DIAGNOSIS — I10 Essential (primary) hypertension: Secondary | ICD-10-CM | POA: Diagnosis not present

## 2020-01-22 DIAGNOSIS — F418 Other specified anxiety disorders: Secondary | ICD-10-CM

## 2020-01-22 MED ORDER — AMLODIPINE BESYLATE 2.5 MG PO TABS
2.5000 mg | ORAL_TABLET | Freq: Every day | ORAL | 3 refills | Status: DC
Start: 2020-01-22 — End: 2021-02-23

## 2020-01-22 NOTE — Patient Instructions (Signed)
For anxiety and depression increase your lexpro to 10 mg this coming Monday. You decided to titrate up from 5 mg dose and that is ok. Might need higher dose after using 10 mg for a week or 2.  For alcohol abuse attend substance abuse therapist.   Discussion on not using valium in taper manner daily since you are not confident you are ready to quit alcohol. Can only use very sparingly if no alcohol use within 12-24 hours. Definitely not to use at same time.  Your bp is still mild high but better. I think good idea to restart low dose amlodipine.  Follow up in one month or as needed

## 2020-01-22 NOTE — Progress Notes (Signed)
Subjective:    Patient ID: Cynthia Salazar, female    DOB: 1990/12/21, 29 y.o.   MRN: [id number]  HPI  Pt in for follow up . She did not take the valium in tapered manner  since she new she would continue to drink alcohol. Therapist advised her not to use after honest conversation she had.  Pt is going to see addiction therapist. She will have a virtual appointment next Thursday.  Pt states it is difficult with recent attempt to stop alcohol since she just went thru a break up.  Pt had no alcohol use today.   Pt has depression. She started back on lexapro. She is taking 1/2 tab a day on Monday. Will start 10 mg on this Monday.  LMP- 1 week ago.  No thoughts of harm to self or others expressed.     Review of Systems  Constitutional: Negative for chills and fatigue.  HENT: Negative for congestion and ear discharge.   Respiratory: Negative for cough, chest tightness and shortness of breath.   Cardiovascular: Negative for chest pain and palpitations.  Gastrointestinal: Negative for abdominal pain.  Neurological: Negative for dizziness, syncope, weakness and light-headedness.  Hematological: Negative for adenopathy.  Psychiatric/Behavioral: Positive for dysphoric mood. Negative for behavioral problems, sleep disturbance and suicidal ideas. The patient is nervous/anxious.     Past Medical History:  Diagnosis Date  . Abnormal liver function test 03/18/2013  . Alcohol use 06/03/2015  . Allergy   . Asthma   . Asthma 05/29/2012  . Asthma 05/29/2012  . Chicken pox as a child  . Depression 30 yrs old  . Depression with anxiety   . Essential hypertension 06/03/2015  . Insomnia 03/17/2013  . Other and unspecified hyperlipidemia 03/18/2013  . Overweight 04/05/2014  . Preventative health care 05/28/2012     Social History   Socioeconomic History  . Marital status: Single    Spouse name: Not on file  . Number of children: Not on file  . Years of education: Not on file  . Highest  education level: Not on file  Occupational History  . Not on file  Tobacco Use  . Smoking status: Former Research scientist (life sciences)  . Smokeless tobacco: Never Used  . Tobacco comment: occasionally smoked one  Substance and Sexual Activity  . Alcohol use: Yes    Comment: occasionally  . Drug use: No  . Sexual activity: Yes    Partners: Female  Other Topics Concern  . Not on file  Social History Narrative  . Not on file   Social Determinants of Health   Financial Resource Strain:   . Difficulty of Paying Living Expenses:   Food Insecurity:   . Worried About Charity fundraiser in the Last Year:   . Arboriculturist in the Last Year:   Transportation Needs:   . Film/video editor (Medical):   Marland Kitchen Lack of Transportation (Non-Medical):   Physical Activity:   . Days of Exercise per Week:   . Minutes of Exercise per Session:   Stress:   . Feeling of Stress :   Social Connections:   . Frequency of Communication with Friends and Family:   . Frequency of Social Gatherings with Friends and Family:   . Attends Religious Services:   . Active Member of Clubs or Organizations:   . Attends Archivist Meetings:   Marland Kitchen Marital Status:   Intimate Partner Violence:   . Fear of Current or Ex-Partner:   . Emotionally  Abused:   Marland Kitchen Physically Abused:   . Sexually Abused:     Past Surgical History:  Procedure Laterality Date  . WISDOM TOOTH EXTRACTION  19 yrs    Family History  Problem Relation Age of Onset  . Nephrolithiasis Father   . Hypertension Father   . Nephrolithiasis Brother   . COPD Paternal Grandmother     Allergies  Allergen Reactions  . Aleve [Naproxen Sodium]     wheeze    Current Outpatient Medications on File Prior to Visit  Medication Sig Dispense Refill  . amLODipine (NORVASC) 2.5 MG tablet TAKE 1 TABLET BY MOUTH EVERY DAY (Patient not taking: Reported on 01/15/2020) 30 tablet 0  . benzonatate (TESSALON) 100 MG capsule Take 1 capsule (100 mg total) by mouth 3 (three)  times daily as needed for cough. (Patient not taking: Reported on 01/15/2020) 30 capsule 0  . cetirizine (ZYRTEC) 10 MG tablet TAKE 1 TABLET BY MOUTH DAILY AS NEEDED FOR ALLERGIES (Patient not taking: Reported on 01/15/2020) 90 tablet 1  . diazepam (VALIUM) 5 MG tablet 1 tab po q 6 hours prn anxiety day 1. 1 tab po q 8 hours prn anxiety day 2. 1 tab po q 12 hours day 3.  1 tab po day 4-8. 14 tablet 0  . escitalopram (LEXAPRO) 10 MG tablet Take 1 tablet (10 mg total) by mouth daily. 30 tablet 3  . escitalopram (LEXAPRO) 20 MG tablet TAKE 1 TABLET BY MOUTH EVERY DAY (Patient not taking: Reported on 01/15/2020) 90 tablet 2  . famotidine (PEPCID) 40 MG tablet Take 1 tablet (40 mg total) by mouth daily. (Patient not taking: Reported on 01/15/2020) 30 tablet 5  . levothyroxine (SYNTHROID) 50 MCG tablet Take 1 tablet (50 mcg total) by mouth daily. (Patient not taking: Reported on 01/15/2020) 90 tablet 3  . predniSONE (STERAPRED UNI-PAK 21 TAB) 10 MG (21) TBPK tablet Taper as directed. (Patient not taking: Reported on 01/15/2020) 21 tablet 0  . PROAIR HFA 108 (90 Base) MCG/ACT inhaler INHALE 2 PUFFS INTO THE LUNGS EVERY 4 (FOUR) HOURS AS NEEDED. (Patient not taking: Reported on 01/15/2020) 18 g 3  . SPRINTEC 28 0.25-35 MG-MCG tablet Take 1 tablet by mouth daily. (Patient not taking: Reported on 01/15/2020) 28 tablet 5   No current facility-administered medications on file prior to visit.    Wt 174 lb 12.8 oz (79.3 kg)   LMP 01/13/2020   BMI 33.03 kg/m       Objective:   Physical Exam   General Mental Status- Alert. General Appearance- Not in acute distress.   Skin General: Color- Normal Color. Moisture- Normal Moisture.  Neck Carotid Arteries- Normal color. Moisture- Normal Moisture. No carotid bruits. No JVD.  Chest and Lung Exam Auscultation: Breath Sounds:-Normal.  Cardiovascular Auscultation:Rythm- Regular. Murmurs & Other Heart Sounds:Auscultation of the heart reveals- No  Murmurs.  Abdomen Inspection:-Inspeection Normal. Palpation/Percussion:Note:No mass. Palpation and Percussion of the abdomen reveal- Non Tender, Non Distended + BS, no rebound or guarding.    Neurologic Cranial Nerve exam:- CN III-XII intact(No nystagmus), symmetric smile. Strength:- 5/5 equal and symmetric strength both upper and lower extremities.     Assessment & Plan:  For anxiety and depression increase your lexpro to 10 mg this coming Monday. You decided to titrate up from 5 mg dose and that is ok. Might need higher dose after using 10 mg for a week or 2.  For alcohol abuse attend substance abuse therapist.   Discussion on not using valium in  taper manner daily since you are not confident you are ready to quit alcohol. Can only use very sparingly if no alcohol use within 12-24 hours. Definitely not to use at same time.  Your bp is still mild high but better. I think good idea to restart low dose amlodipine.  Follow up in one month or as needed  Esperanza Richters, PA-C   Time spent with patient today was 20  minutes which consisted of chart review, discussing diagnosis, work up, treatment and documentation.

## 2020-02-06 ENCOUNTER — Other Ambulatory Visit: Payer: Self-pay | Admitting: Medical

## 2020-04-20 ENCOUNTER — Ambulatory Visit: Payer: BC Managed Care – PPO | Admitting: Medical

## 2020-06-13 ENCOUNTER — Telehealth: Payer: Self-pay

## 2020-06-13 NOTE — Telephone Encounter (Signed)
Writer received notification pt was on phone with c/o ETOH withdrawls and seeking medical help. Writer asked if she had a drink of ETOH in past 24 hrs. PT confirmed and denied any other illecit drug use. Pt stated she was seeking medications for active withdrawal. Writer encouraged pt to call 911 for help with EMT and being seen by and Emergency provider who can prescribe and assess in the emergent situation. Pt agreed. Will f/u with provider after d/c from inpatient facility.

## 2020-06-13 NOTE — Telephone Encounter (Signed)
TY, she has been in in person treatment in past so I suspect she needs that again.

## 2020-06-14 ENCOUNTER — Other Ambulatory Visit: Payer: Self-pay | Admitting: Family Medicine

## 2020-06-14 ENCOUNTER — Encounter: Payer: Self-pay | Admitting: Family Medicine

## 2020-06-14 ENCOUNTER — Encounter: Payer: Self-pay | Admitting: Medical

## 2020-06-14 MED ORDER — DIAZEPAM 5 MG PO TABS: ORAL_TABLET | ORAL | 0 refills | Status: DC

## 2020-06-14 NOTE — Telephone Encounter (Signed)
Message forwarded to PCP for advise on work note

## 2020-06-14 NOTE — Telephone Encounter (Signed)
Pt scheduled of 07/05/20 @9am  with PCP

## 2020-06-15 ENCOUNTER — Telehealth: Payer: Self-pay | Admitting: Family Medicine

## 2020-06-15 NOTE — Telephone Encounter (Signed)
Patient called back needing a note prior to going to work tomorrow-- Please advise

## 2020-06-15 NOTE — Telephone Encounter (Signed)
CallerNatoya Viscomi  Call Back # [phone]   Patient called in to see what the status was on her work note, but also mentioned that she would like the note to permit her back to work 06/17/2020 with no specifics as to why she was out this week if possible. Please advise   Thank You!

## 2020-06-15 NOTE — Telephone Encounter (Signed)
Spoke with patient today and will call back on 10/14 to letter of work excuse to employer and additional information needed.

## 2020-06-16 NOTE — Telephone Encounter (Signed)
LMOM to return call and will send note via email.  Waiting for email address to be given, and work excuse for the dates of 10/11, 10/12, 10/13, 10/14, and 10/15 from provider and will have note drafted up and ready for patient.

## 2020-06-17 NOTE — Telephone Encounter (Signed)
Caller states she is returning a call.  Telephone: [phone]

## 2020-06-17 NOTE — Telephone Encounter (Signed)
See mychart message.  Note sent.

## 2020-06-17 NOTE — Telephone Encounter (Signed)
Called pt to inform of work note and medication to pharmacy. Vm box is full. Letter in front for pt to pick up.

## 2020-06-21 NOTE — Telephone Encounter (Signed)
Pt called spoke with her she has picked up letter and medications. Is thankful for your assistance Dr Abner Greenspan

## 2020-06-29 ENCOUNTER — Telehealth: Payer: Self-pay

## 2020-06-29 ENCOUNTER — Telehealth: Payer: Self-pay | Admitting: Family Medicine

## 2020-06-29 ENCOUNTER — Other Ambulatory Visit: Payer: Self-pay | Admitting: Family Medicine

## 2020-06-29 MED ORDER — METOPROLOL SUCCINATE ER 25 MG PO TB24
25.0000 mg | ORAL_TABLET | Freq: Every day | ORAL | 1 refills | Status: DC
Start: 2020-06-29 — End: 2021-02-23

## 2020-06-29 NOTE — Telephone Encounter (Signed)
Letter placed in front reception for pt to pick up. Message left for pt to pick up

## 2020-06-29 NOTE — Telephone Encounter (Signed)
Called pt to set up Virtual visit for Friday at 10:40am

## 2020-06-29 NOTE — Telephone Encounter (Signed)
Patient is requesting a note to return to work, patient states she called out of work today b/c of her blood pressure.  Please Advise

## 2020-06-29 NOTE — Telephone Encounter (Signed)
Medication: amLODipine (NORVASC) 2.5 MG tablet    Has the patient contacted their pharmacy? No. (If no, request that the patient contact the pharmacy for the refill.) (If yes, when and what did the pharmacy advise?)  Preferred Pharmacy (with phone number or street name):  [id number] - [place], [place] - [address]  [address] [place], [place] [address]  Phone:  [phone] Fax:  [phone]  DEA #:  [id number] Agent: Please be advised that RX refills may take up to 3 business days. We ask that you follow-up with your pharmacy.

## 2020-06-29 NOTE — Telephone Encounter (Signed)
Yes fine to give patient the note. It might already be done. See the forwarded mychart message for further plan

## 2020-07-01 ENCOUNTER — Telehealth: Payer: Self-pay | Admitting: Family Medicine

## 2020-07-03 ENCOUNTER — Encounter: Payer: Self-pay | Admitting: Family Medicine

## 2020-07-04 NOTE — Telephone Encounter (Signed)
Patient states she needs the note just for today. She would like letter to be email to [email or web address]

## 2020-07-05 ENCOUNTER — Ambulatory Visit: Payer: Self-pay | Admitting: Family Medicine

## 2020-07-19 ENCOUNTER — Emergency Department (HOSPITAL_BASED_OUTPATIENT_CLINIC_OR_DEPARTMENT_OTHER): Payer: Self-pay

## 2020-07-19 ENCOUNTER — Emergency Department (HOSPITAL_BASED_OUTPATIENT_CLINIC_OR_DEPARTMENT_OTHER)
Admission: EM | Admit: 2020-07-19 | Discharge: 2020-07-19 | Disposition: A | Discharge status: Home / Self Care | Payer: Self-pay | Attending: Emergency Medicine | Admitting: Emergency Medicine

## 2020-07-19 ENCOUNTER — Other Ambulatory Visit (HOSPITAL_BASED_OUTPATIENT_CLINIC_OR_DEPARTMENT_OTHER): Payer: Self-pay | Admitting: Emergency Medicine

## 2020-07-19 ENCOUNTER — Encounter (HOSPITAL_BASED_OUTPATIENT_CLINIC_OR_DEPARTMENT_OTHER): Payer: Self-pay | Admitting: Emergency Medicine

## 2020-07-19 ENCOUNTER — Other Ambulatory Visit: Payer: Self-pay

## 2020-07-19 DIAGNOSIS — F10239 Alcohol dependence with withdrawal, unspecified: Secondary | ICD-10-CM | POA: Insufficient documentation

## 2020-07-19 DIAGNOSIS — F10930 Alcohol use, unspecified with withdrawal, uncomplicated: Secondary | ICD-10-CM

## 2020-07-19 DIAGNOSIS — R110 Nausea: Secondary | ICD-10-CM | POA: Insufficient documentation

## 2020-07-19 DIAGNOSIS — Z20822 Contact with and (suspected) exposure to covid-19: Secondary | ICD-10-CM | POA: Insufficient documentation

## 2020-07-19 DIAGNOSIS — R059 Cough, unspecified: Secondary | ICD-10-CM | POA: Insufficient documentation

## 2020-07-19 DIAGNOSIS — Z79899 Other long term (current) drug therapy: Secondary | ICD-10-CM | POA: Insufficient documentation

## 2020-07-19 DIAGNOSIS — I10 Essential (primary) hypertension: Secondary | ICD-10-CM | POA: Insufficient documentation

## 2020-07-19 DIAGNOSIS — Z87891 Personal history of nicotine dependence: Secondary | ICD-10-CM | POA: Insufficient documentation

## 2020-07-19 DIAGNOSIS — J45909 Unspecified asthma, uncomplicated: Secondary | ICD-10-CM | POA: Insufficient documentation

## 2020-07-19 DIAGNOSIS — F10230 Alcohol dependence with withdrawal, uncomplicated: Secondary | ICD-10-CM

## 2020-07-19 LAB — CBC WITH DIFFERENTIAL/PLATELET
Abs Immature Granulocytes: 0.03 10*3/uL (ref 0.00–0.07)
Basophils Absolute: 0.1 10*3/uL (ref 0.0–0.1)
Basophils Relative: 1 %
Eosinophils Absolute: 0 10*3/uL (ref 0.0–0.5)
Eosinophils Relative: 0 %
HCT: 46.2 % — ABNORMAL HIGH (ref 36.0–46.0)
Hemoglobin: 16 g/dL — ABNORMAL HIGH (ref 12.0–15.0)
Immature Granulocytes: 0 %
Lymphocytes Relative: 6 %
Lymphs Abs: 0.5 10*3/uL — ABNORMAL LOW (ref 0.7–4.0)
MCH: 33.2 pg (ref 26.0–34.0)
MCHC: 34.6 g/dL (ref 30.0–36.0)
MCV: 95.9 fL (ref 80.0–100.0)
Monocytes Absolute: 0.3 10*3/uL (ref 0.1–1.0)
Monocytes Relative: 3 %
Neutro Abs: 8.6 10*3/uL — ABNORMAL HIGH (ref 1.7–7.7)
Neutrophils Relative %: 90 %
Platelets: 262 10*3/uL (ref 150–400)
RBC: 4.82 MIL/uL (ref 3.87–5.11)
RDW: 17.2 % — ABNORMAL HIGH (ref 11.5–15.5)
WBC: 9.6 10*3/uL (ref 4.0–10.5)
nRBC: 0 % (ref 0.0–0.2)

## 2020-07-19 LAB — COMPREHENSIVE METABOLIC PANEL WITH GFR
ALT: 191 U/L — ABNORMAL HIGH (ref 0–44)
AST: 290 U/L — ABNORMAL HIGH (ref 15–41)
Albumin: 4.8 g/dL (ref 3.5–5.0)
Alkaline Phosphatase: 99 U/L (ref 38–126)
Anion gap: 21 — ABNORMAL HIGH (ref 5–15)
BUN: 5 mg/dL — ABNORMAL LOW (ref 6–20)
CO2: 21 mmol/L — ABNORMAL LOW (ref 22–32)
Calcium: 9.3 mg/dL (ref 8.9–10.3)
Chloride: 94 mmol/L — ABNORMAL LOW (ref 98–111)
Creatinine, Ser: 0.78 mg/dL (ref 0.44–1.00)
GFR, Estimated: >60 mL/min
Glucose, Bld: 126 mg/dL — ABNORMAL HIGH (ref 70–99)
Potassium: 3.4 mmol/L — ABNORMAL LOW (ref 3.5–5.1)
Sodium: 136 mmol/L (ref 135–145)
Total Bilirubin: 1.7 mg/dL — ABNORMAL HIGH (ref 0.3–1.2)
Total Protein: 8.7 g/dL — ABNORMAL HIGH (ref 6.5–8.1)

## 2020-07-19 LAB — ETHANOL: Alcohol, Ethyl (B): 90 mg/dL — ABNORMAL HIGH

## 2020-07-19 LAB — RESPIRATORY PANEL BY RT PCR (FLU A&B, COVID)
Influenza A by PCR: NEGATIVE
Influenza B by PCR: NEGATIVE
SARS Coronavirus 2 by RT PCR: NEGATIVE

## 2020-07-19 MED ORDER — LORAZEPAM 2 MG/ML IJ SOLN
1.0000 mg | Freq: Once | INTRAMUSCULAR | Status: AC
  Administered 2020-07-19: 1 mg via INTRAVENOUS
  Filled 2020-07-19: qty 1

## 2020-07-19 MED ORDER — ONDANSETRON 4 MG PO TBDP: 4.0000 mg | ORAL_TABLET | Freq: Three times a day (TID) | ORAL | 0 refills | Status: DC | PRN

## 2020-07-19 MED ORDER — CHLORDIAZEPOXIDE HCL 25 MG PO CAPS: ORAL_CAPSULE | ORAL | 0 refills | Status: DC

## 2020-07-19 MED ORDER — ONDANSETRON HCL 4 MG/2ML IJ SOLN
4.0000 mg | Freq: Once | INTRAMUSCULAR | Status: AC
  Administered 2020-07-19: 4 mg via INTRAVENOUS
  Filled 2020-07-19: qty 2

## 2020-07-19 MED ORDER — SODIUM CHLORIDE 0.9 % IV BOLUS (SEPSIS)
1000.0000 mL | Freq: Once | INTRAVENOUS | Status: AC
  Administered 2020-07-19: 1000 mL via INTRAVENOUS

## 2020-07-19 MED ORDER — SODIUM CHLORIDE 0.9 % IV SOLN: 1000.0000 mL | INTRAVENOUS | Status: DC

## 2020-07-19 MED ORDER — CHLORDIAZEPOXIDE HCL 25 MG PO CAPS
50.0000 mg | ORAL_CAPSULE | Freq: Once | ORAL | Status: AC
  Administered 2020-07-19: 50 mg via ORAL
  Filled 2020-07-19: qty 2

## 2020-07-19 MED FILL — ONDANSETRON ODT 4 MG TABLET: 4 | 3 days supply | Qty: 8 | Fill #0

## 2020-07-19 MED FILL — CHLORDIAZEPOXIDE 25 MG CAPS: 25 | 3 days supply | Qty: 12 | Fill #0

## 2020-07-19 NOTE — ED Notes (Signed)
O2 sat dipped briefly to 89% following ativan administration, quickly returned to baseline.  No respiratory distress.

## 2020-07-19 NOTE — ED Provider Notes (Signed)
[facility] EMERGENCY DEPARTMENT Provider Note  CSN: [id number] Arrival date & time: 07/19/20 0456  Chief Complaint(s) Alcohol Intoxication and Cough  HPI Cynthia Salazar is a 29 y.o. female with a past medical history listed below including alcoholism who presents to the emergency department with concern for withdrawals.  Patient reports that she is feeling tremulous.  Reports last drink was other Sunday or Monday night.  Reports that she drinks a pint of rum a day, for roughly 10 years.  Patient also endorsing nonproductive cough and chills with rhinorrhea.  Denies any fevers.  No nausea or vomiting.  No shortness of breath.  No chest pain.  No abdominal pain.  No urinary symptoms.  No other symptoms.Marland Kitchen  HPI  Past Medical History Past Medical History:  Diagnosis Date  . Abnormal liver function test 03/18/2013  . Alcohol use 06/03/2015  . Allergy   . Asthma   . Asthma 05/29/2012  . Asthma 05/29/2012  . Chicken pox as a child  . Depression 32 yrs old  . Depression with anxiety   . Essential hypertension 06/03/2015  . Insomnia 03/17/2013  . Other and unspecified hyperlipidemia 03/18/2013  . Overweight 04/05/2014  . Preventative health care 05/28/2012   Patient Active Problem List   Diagnosis Date Noted  . Labile blood pressure 08/06/2018  . Reflux involving intestinal tract 10/31/2017  . Heavy menstrual bleeding 10/31/2017  . Alcohol use (HCC) 06/03/2015  . Overweight 04/05/2014  . Low back pain 11/18/2013  . Hyperlipidemia, mixed 03/18/2013  . Abnormal liver function test 03/18/2013  . Insomnia 03/17/2013  . Mild intermittent asthma 05/29/2012  . Preventative health care 05/28/2012  . Depression with anxiety   . Allergic state    Home Medication(s) Prior to Admission medications   Medication Sig Start Date End Date Taking? Authorizing Provider  amLODipine (NORVASC) 2.5 MG tablet TAKE 1 TABLET BY MOUTH EVERY DAY Patient not taking: Reported on 01/15/2020 08/31/19    Bradd Canary, MD  amLODipine (NORVASC) 2.5 MG tablet Take 1 tablet (2.5 mg total) by mouth daily. 01/22/20   Saguier, Ramon Dredge, PA-C  benzonatate (TESSALON) 100 MG capsule Take 1 capsule (100 mg total) by mouth 3 (three) times daily as needed for cough. Patient not taking: Reported on 01/15/2020 03/16/19   Saguier, Ramon Dredge, PA-C  cetirizine (ZYRTEC) 10 MG tablet TAKE 1 TABLET BY MOUTH DAILY AS NEEDED FOR ALLERGIES Patient not taking: Reported on 01/15/2020 01/31/16   Bradd Canary, MD  chlordiazePOXIDE (LIBRIUM) 25 MG capsule 50mg  PO TID x 1D, then 25-50mg  PO BID X 1D, then 25-50mg  PO QD X 1D 07/19/20   Jaydeen Odor, 07/21/20, MD  diazepam (VALIUM) 5 MG tablet 1 tab po q 6 hours prn anxiety day 1. 1 tab po q 8 hours prn anxiety day 2. 1 tab po q 12 hours day 3. 1 tab po day 4-8. 06/14/20   08/14/20, MD  escitalopram (LEXAPRO) 10 MG tablet TAKE 1 TABLET BY MOUTH EVERY DAY 02/11/20   04/12/20, MD  escitalopram (LEXAPRO) 20 MG tablet TAKE 1 TABLET BY MOUTH EVERY DAY Patient not taking: Reported on 01/15/2020 10/09/18   12/08/18, MD  famotidine (PEPCID) 40 MG tablet Take 1 tablet (40 mg total) by mouth daily. Patient not taking: Reported on 01/15/2020 08/07/18   14/5/19, MD  levothyroxine (SYNTHROID) 50 MCG tablet Take 1 tablet (50 mcg total) by mouth daily. Patient not taking: Reported on 01/15/2020 07/02/19   07/04/19  A, MD  metoprolol succinate (TOPROL-XL) 25 MG 24 hr tablet Take 1 tablet (25 mg total) by mouth daily. 06/29/20   Bradd Canary, MD  predniSONE (STERAPRED UNI-PAK 21 TAB) 10 MG (21) TBPK tablet Taper as directed. Patient not taking: Reported on 01/15/2020 03/16/19   Saguier, Ramon Dredge, PA-C  PROAIR HFA 108 (90 Base) MCG/ACT inhaler INHALE 2 PUFFS INTO THE LUNGS EVERY 4 (FOUR) HOURS AS NEEDED. Patient not taking: Reported on 01/15/2020 10/09/19   Bradd Canary, MD  SPRINTEC 28 0.25-35 MG-MCG tablet Take 1 tablet by mouth daily. Patient not taking: Reported on  01/15/2020 03/24/19   Saguier, Kateri Mc                                                                                                                                    Past Surgical History Past Surgical History:  Procedure Laterality Date  . WISDOM TOOTH EXTRACTION  19 yrs   Family History Family History  Problem Relation Age of Onset  . Nephrolithiasis Father   . Hypertension Father   . Nephrolithiasis Brother   . COPD Paternal Grandmother     Social History Social History   Tobacco Use  . Smoking status: Former Games developer  . Smokeless tobacco: Never Used  . Tobacco comment: occasionally smoked one  Substance Use Topics  . Alcohol use: Yes    Comment: 1 pint per day   . Drug use: No   Allergies Aleve [naproxen sodium]  Review of Systems Review of Systems All other systems are reviewed and are negative for acute change except as noted in the HPI  Physical Exam Vital Signs  I have reviewed the triage vital signs BP (!) 165/129 (BP Location: Right Arm)   Pulse 100   Temp 98 F (36.7 C) (Oral)   Resp 14   Ht 5\' 1"  (1.549 m)   Wt 71.2 kg   SpO2 94%   BMI 29.66 kg/m   Physical Exam Vitals reviewed.  Constitutional:      General: She is not in acute distress.    Appearance: She is well-developed and overweight. She is diaphoretic.  HENT:     Head: Normocephalic and atraumatic.     Nose: Nose normal.  Eyes:     General: No scleral icterus.       Right eye: No discharge.        Left eye: No discharge.     Conjunctiva/sclera: Conjunctivae normal.     Pupils: Pupils are equal, round, and reactive to light.  Cardiovascular:     Rate and Rhythm: Regular rhythm. Tachycardia present.     Heart sounds: No murmur heard.  No friction rub. No gallop.   Pulmonary:     Effort: Pulmonary effort is normal. No respiratory distress.     Breath sounds: Normal breath sounds. No stridor. No rales.  Abdominal:     General: There is no distension.  Palpations: Abdomen  is soft.     Tenderness: There is no abdominal tenderness. There is no guarding or rebound.  Musculoskeletal:        General: No tenderness.     Cervical back: Normal range of motion and neck supple.  Skin:    General: Skin is warm.     Findings: No erythema or rash.  Neurological:     Mental Status: She is alert and oriented to person, place, and time.     Comments: Tremulous. Moves all extremities     ED Results and Treatments Labs (all labs ordered are listed, but only abnormal results are displayed) Labs Reviewed  COMPREHENSIVE METABOLIC PANEL - Abnormal; Notable for the following components:      Result Value   Potassium 3.4 (*)    Chloride 94 (*)    CO2 21 (*)    Glucose, Bld 126 (*)    BUN 5 (*)    Total Protein 8.7 (*)    AST 290 (*)    ALT 191 (*)    Total Bilirubin 1.7 (*)    Anion gap 21 (*)    All other components within normal limits  ETHANOL - Abnormal; Notable for the following components:   Alcohol, Ethyl (B) 90 (*)    All other components within normal limits  CBC WITH DIFFERENTIAL/PLATELET - Abnormal; Notable for the following components:   Hemoglobin 16.0 (*)    HCT 46.2 (*)    RDW 17.2 (*)    Neutro Abs 8.6 (*)    Lymphs Abs 0.5 (*)    All other components within normal limits  RESPIRATORY PANEL BY RT PCR (FLU A&B, COVID)  RAPID URINE DRUG SCREEN, HOSP PERFORMED  PREGNANCY, URINE                                                                                                                         EKG    Radiology DG Chest Port 1 View  Result Date: 07/19/2020 CLINICAL DATA:  Cough EXAM: PORTABLE CHEST 1 VIEW COMPARISON:  None. FINDINGS: The heart size and mediastinal contours are within normal limits. Both lungs are clear. The visualized skeletal structures are unremarkable. IMPRESSION: No active disease. Electronically Signed   By: Helyn Numbers MD   On: 07/19/2020 06:05    Pertinent labs & imaging results that were available during my  care of the patient were reviewed by me and considered in my medical decision making (see chart for details).  Medications Ordered in ED Medications  sodium chloride 0.9 % bolus 1,000 mL (1,000 mLs Intravenous New Bag/Given 07/19/20 0556)    Followed by  0.9 %  sodium chloride infusion (has no administration in time range)  ondansetron (ZOFRAN) injection 4 mg (4 mg Intravenous Given 07/19/20 0557)  LORazepam (ATIVAN) injection 1 mg (1 mg Intravenous Given 07/19/20 0600)  chlordiazePOXIDE (LIBRIUM) capsule 50 mg (50 mg Oral Given 07/19/20 0605)  Procedures Procedures  (including critical care time)  Medical Decision Making / ED Course I have reviewed the nursing notes for this encounter and the patient's prior records (if available in EHR or on provided paperwork).   Albertina Parrshley Yearick was evaluated in Emergency Department on 07/19/2020 for the symptoms described in the history of present illness. She was evaluated in the context of the global COVID-19 pandemic, which necessitated consideration that the patient might be at risk for infection with the SARS-CoV-2 virus that causes COVID-19. Institutional protocols and algorithms that pertain to the evaluation of patients at risk for COVID-19 are in a state of rapid change based on information released by regulatory bodies including the CDC and federal and state organizations. These policies and algorithms were followed during the patient's care in the ED.  1. Alcohol use disorder Patient presentation is consistent with early withdrawal.  She is diaphoretic, tachycardic, hypertensive, tremulous.  No altered mental status.  Not consistent with DTs.  We will obtain screening labs, provide IV fluids, IV Ativan, and p.o. Librium. Patient's labs without leukocytosis.  CBC is hemoconcentrated though. Metabolic panel with  mild hypokalemia, hypochloremia, hyperglycemia.  No renal insufficiency.  Patient has moderate transaminitis, likely secondary to alcohol abuse.  6:42 AM On reassessment, patient's heart rate has improved, she is no longer diaphoretic, blood pressure is also improving.  We will continue to monitor the patient.  If the patient continues to improve, I feel that she would be appropriate for outpatient Librium taper.  Strongly encouraged outpatient rehab.    2.  Chills, nasal congestion, cough. Patient denied any known sick contacts. Lungs clear to auscultation bilaterally. Patient was afebrile. Chest x-ray without evidence of pneumonia. Covid/influenza ordered.      Patient care turned over to oncoming provider. Patient case and results discussed in detail; please see their note for further ED managment.      Final Clinical Impression(s) / ED Diagnoses Final diagnoses:  Cough  Alcohol withdrawal syndrome without complication (HCC)      This chart was dictated using voice recognition software.  Despite best efforts to proofread,  errors can occur which can change the documentation meaning.   Nira Connardama, Lizzette Carbonell Eduardo, MD 07/19/20 [phone]

## 2020-07-19 NOTE — ED Triage Notes (Signed)
Pt state her for alcohol withdraw. Also has cough, runny nose, nausea and feels she is dehydrated.

## 2020-07-19 NOTE — ED Provider Notes (Signed)
°  Physical Exam  BP (!) 151/113    Pulse 99    Temp 98 F (36.7 C) (Oral)    Resp (!) 23    Ht 5\' 1"  (1.549 m)    Wt 71.2 kg    SpO2 97%    BMI 29.66 kg/m   Physical Exam  ED Course/Procedures     Procedures  MDM    Received patient in signout.  Alcohol abuse and withdrawal.  Feels better after benzos.  Did have another episode she felt worse.  Now we will add her again.  I think it is worth discharge home and trial with oral Librium.  We will also add Zofran.  Discharged with outpatient follow-up.     , MD 07/19/20 [phone]

## 2020-07-20 ENCOUNTER — Encounter: Payer: Self-pay | Admitting: Family Medicine

## 2020-07-22 ENCOUNTER — Telehealth: Payer: Self-pay | Admitting: Family Medicine

## 2020-07-22 ENCOUNTER — Other Ambulatory Visit: Payer: Self-pay

## 2021-02-20 ENCOUNTER — Encounter: Payer: Self-pay | Admitting: Family Medicine

## 2021-02-23 ENCOUNTER — Other Ambulatory Visit: Payer: Self-pay

## 2021-02-23 ENCOUNTER — Ambulatory Visit (INDEPENDENT_AMBULATORY_CARE_PROVIDER_SITE_OTHER): Payer: Self-pay | Admitting: Medical

## 2021-02-23 VITALS — BP 145/92 | HR 100 | Temp 98.9°F | Resp 18 | Ht 61.0 in | Wt 153.0 lb

## 2021-02-23 DIAGNOSIS — F418 Other specified anxiety disorders: Secondary | ICD-10-CM

## 2021-02-23 DIAGNOSIS — R748 Abnormal levels of other serum enzymes: Secondary | ICD-10-CM

## 2021-02-23 MED ORDER — ESCITALOPRAM OXALATE 10 MG PO TABS: 10.0000 mg | ORAL_TABLET | Freq: Every day | ORAL | 0 refills | Status: DC

## 2021-02-23 NOTE — Progress Notes (Signed)
Subjective:    Patient ID: Cynthia Salazar, female    DOB: 1990-10-17, 30 y.o.   MRN: [id number]  HPI  Pt in for evaluation of depression.  Pt phq-9 score is 14.   Pt in the past reports that she did well with lexapro in the past. Pt states in the past she stopped taking the lexapro when she started drinking alcohol.  On and off struggled with alcohol abuse. Pt has not drank any alcohol since Monday night.   Pt has struggled. She had at one point stopped drinking alcohol for 2 months April 3rd week  to end of May.   Pt in the past has gone thru counseling and it was not that helpful.   She does not have insurance.  Pt recently will drink about a pint of vodka when she does.   Pt has been to both AA and rehab. States did not.  LMP- beginning of this month.  No thought of harm to self or other today. Though she does report several days of transient thoughts. No planning.  She is not happy with her job.  Pt is trying to get health insurance.     Review of Systems  Constitutional:  Negative for chills, fatigue, fever and unexpected weight change.  HENT:  Negative for congestion.   Respiratory:  Negative for apnea, choking and wheezing.   Cardiovascular:  Negative for chest pain and palpitations.  Gastrointestinal:  Negative for abdominal pain, blood in stool and nausea.  Musculoskeletal:  Negative for back pain and myalgias.  Psychiatric/Behavioral:  Positive for dysphoric mood. Negative for behavioral problems and sleep disturbance. The patient is not nervous/anxious.     Past Medical History:  Diagnosis Date   Abnormal liver function test 03/18/2013   Alcohol use 06/03/2015   Allergy    Asthma    Asthma 05/29/2012   Asthma 05/29/2012   Chicken pox as a child   Depression 82 yrs old   Depression with anxiety    Essential hypertension 06/03/2015   Insomnia 03/17/2013   Other and unspecified hyperlipidemia 03/18/2013   Overweight 04/05/2014   Preventative health care  05/28/2012     Social History   Socioeconomic History   Marital status: Single    Spouse name: Not on file   Number of children: Not on file   Years of education: Not on file   Highest education level: Not on file  Occupational History   Not on file  Tobacco Use   Smoking status: Former    Pack years: 0.00   Smokeless tobacco: Never   Tobacco comments:    occasionally smoked one  Substance and Sexual Activity   Alcohol use: Yes    Comment: 1 pint per day    Drug use: No   Sexual activity: Yes    Partners: Female  Other Topics Concern   Not on file  Social History Narrative   Not on file   Social Determinants of Health   Financial Resource Strain: Not on file  Food Insecurity: Not on file  Transportation Needs: Not on file  Physical Activity: Not on file  Stress: Not on file  Social Connections: Not on file  Intimate Partner Violence: Not on file    Past Surgical History:  Procedure Laterality Date   WISDOM TOOTH EXTRACTION  19 yrs    Family History  Problem Relation Age of Onset   Nephrolithiasis Father    Hypertension Father    Nephrolithiasis Brother  COPD Paternal Grandmother     Allergies  Allergen Reactions   Aleve [Naproxen Sodium]     wheeze    No current outpatient medications on file prior to visit.   No current facility-administered medications on file prior to visit.    BP (!) 140/104   Pulse 100   Temp 98.9 F (37.2 C)   Resp 18   Ht 5\' 1"  (1.549 m)   Wt 153 lb (69.4 kg)   SpO2 100%   BMI 28.91 kg/m       Objective:   Physical Exam  General Mental Status- Alert. General Appearance- Not in acute distress.   Skin General: Color- Normal Color. Moisture- Normal Moisture.  Neck Carotid Arteries- Normal color. Moisture- Normal Moisture. No carotid bruits. No JVD.  Chest and Lung Exam Auscultation: Breath Sounds:-Normal.  Cardiovascular Auscultation:Rythm- Regular. Murmurs & Other Heart Sounds:Auscultation of the  heart reveals- No Murmurs.  Abdomen Inspection:-Inspeection Normal. Palpation/Percussion:Note:No mass. Palpation and Percussion of the abdomen reveal- Non Tender, Non Distended + BS, no rebound or guarding.   Neurologic Cranial Nerve exam:- CN III-XII intact(No nystagmus), symmetric smile. Strength:- 5/5 equal and symmetric strength both upper and lower extremities.       Assessment & Plan:   For your depression I prescribed lexapro 10mg  daily.  Hopefully you will feel better.  If at the 2-week mark you feel like there is room for improvement then increase to 20 mg daily.  Discussed if he has any thoughts of harm to self or others then be seen at [facility] long emergency department.  Consider referral to psychiatry or counseling if not getting better with above treatment.  However no insurance presently and you expressed cost concerns.  Try to abstain from any alcohol use.  Did recommend doing metabolic panel to assess liver enzymes today but due to your cost concerns we will wait until you get insurance.  Follow-up in 3 weeks or as needed.  , PA-C

## 2021-02-23 NOTE — Patient Instructions (Addendum)
For your depression I prescribed lexapro 10mg  daily.  Hopefully you will feel better.  If at the 2-week mark you feel like there is room for improvement then increase to 20 mg daily.  Discussed if he has any thoughts of harm to self or others then be seen at [facility] long emergency department.  Consider referral to psychiatry or counseling if not getting better with above treatment.  However no insurance presently and you expressed cost concerns.  Try to abstain from any alcohol use.  Did recommend doing metabolic panel to assess liver enzymes today but due to your cost concerns we will wait until you get insurance.  Follow-up in 3 weeks or as needed.

## 2021-04-21 ENCOUNTER — Encounter: Payer: Self-pay | Admitting: Medical

## 2021-04-21 ENCOUNTER — Encounter: Payer: Self-pay | Admitting: Family Medicine

## 2021-04-24 ENCOUNTER — Emergency Department
Admission: EM | Admit: 2021-04-24 | Discharge: 2021-04-24 | Disposition: A | Discharge status: Home / Self Care | Payer: Self-pay | Source: Home / Self Care

## 2021-04-24 ENCOUNTER — Encounter: Payer: Self-pay | Admitting: Emergency Medicine

## 2021-04-24 ENCOUNTER — Other Ambulatory Visit: Payer: Self-pay

## 2021-04-24 DIAGNOSIS — R1011 Right upper quadrant pain: Secondary | ICD-10-CM

## 2021-04-24 LAB — POCT URINALYSIS DIP (MANUAL ENTRY)
Bilirubin, UA: NEGATIVE
Blood, UA: NEGATIVE
Glucose, UA: NEGATIVE mg/dL
Leukocytes, UA: NEGATIVE
Nitrite, UA: NEGATIVE
Protein Ur, POC: NEGATIVE mg/dL
Spec Grav, UA: 1.02 (ref 1.010–1.025)
Urobilinogen, UA: 0.2 E.U./dL
pH, UA: 6 (ref 5.0–8.0)

## 2021-04-24 LAB — POCT URINE PREGNANCY: Preg Test, Ur: NEGATIVE

## 2021-04-24 NOTE — Discharge Instructions (Addendum)
Advised/instructed patient to go to [facility] now further evaluation of right upper quadrant abdominal pain.

## 2021-04-24 NOTE — ED Provider Notes (Signed)
[facility]    CSN: [id number] Arrival date & time: 04/24/21  1723      History   Chief Complaint Chief Complaint  Patient presents with   Abdominal Pain    HPI Cynthia Salazar is a 30 y.o. female.   HPI 31 year old female presents with abdominal pain for 2 hours.  Patient is unvaccinated for COVID-19 and denies nausea or vomiting.  Past Medical History:  Diagnosis Date   Abnormal liver function test 03/18/2013   Alcohol use 06/03/2015   Allergy    Asthma    Asthma 05/29/2012   Asthma 05/29/2012   Chicken pox as a child   Depression 59 yrs old   Depression with anxiety    Essential hypertension 06/03/2015   Insomnia 03/17/2013   Other and unspecified hyperlipidemia 03/18/2013   Overweight 04/05/2014   Preventative health care 05/28/2012    Patient Active Problem List   Diagnosis Date Noted   Labile blood pressure 08/06/2018   Reflux involving intestinal tract 10/31/2017   Heavy menstrual bleeding 10/31/2017   Alcohol use (HCC) 06/03/2015   Overweight 04/05/2014   Low back pain 11/18/2013   Hyperlipidemia, mixed 03/18/2013   Abnormal liver function test 03/18/2013   Insomnia 03/17/2013   Mild intermittent asthma 05/29/2012   Preventative health care 05/28/2012   Depression with anxiety    Allergic state     Past Surgical History:  Procedure Laterality Date   WISDOM TOOTH EXTRACTION  19 yrs    OB History   No obstetric history on file.      Home Medications    Prior to Admission medications   Not on File    Family History Family History  Problem Relation Age of Onset   Nephrolithiasis Father    Hypertension Father    Nephrolithiasis Brother    COPD Paternal Grandmother     Social History Social History   Tobacco Use   Smoking status: Former   Smokeless tobacco: Never   Tobacco comments:    occasionally smoked one  Vaping Use   Vaping Use: Never used  Substance Use Topics   Alcohol use: Yes    Comment: 1 pint per day     Drug use: No     Allergies   Aleve [naproxen sodium]   Review of Systems Review of Systems  Gastrointestinal:  Positive for abdominal pain.       Right-sided abdominal pain x 2.5 hours    Physical Exam Triage Vital Signs ED Triage Vitals  Enc Vitals Group     BP 04/24/21 1736 (!) 137/97     Pulse Rate 04/24/21 1736 (!) 101     Resp 04/24/21 1736 18     Temp 04/24/21 1736 98.9 F (37.2 C)     Temp Source 04/24/21 1736 Oral     SpO2 04/24/21 1736 95 %     Weight 04/24/21 1737 150 lb (68 kg)     Height 04/24/21 1737 5\' 2"  (1.575 m)     Head Circumference --      Peak Flow --      Pain Score 04/24/21 1736 6     Pain Loc --      Pain Edu? --      Excl. in GC? --    No data found.  Updated Vital Signs BP (!) 137/97 (BP Location: Right Arm)   Pulse (!) 101   Temp 98.9 F (37.2 C) (Oral)   Resp 18   Ht 5\' 2"  (  1.575 m)   Wt 150 lb (68 kg)   LMP 03/18/2021 (Exact Date)   SpO2 95%   BMI 27.44 kg/m    Physical Exam Vitals and nursing note reviewed.  Constitutional:      General: She is not in acute distress.    Appearance: Normal appearance. She is normal weight. She is not ill-appearing.  HENT:     Head: Normocephalic and atraumatic.     Mouth/Throat:     Mouth: Mucous membranes are moist.     Pharynx: Oropharynx is clear.  Eyes:     Extraocular Movements: Extraocular movements intact.     Conjunctiva/sclera: Conjunctivae normal.     Pupils: Pupils are equal, round, and reactive to light.  Cardiovascular:     Rate and Rhythm: Normal rate and regular rhythm.     Pulses: Normal pulses.     Heart sounds: Normal heart sounds.  Pulmonary:     Effort: Pulmonary effort is normal.     Breath sounds: Normal breath sounds. No wheezing, rhonchi or rales.  Abdominal:     General: There is no distension.     Palpations: There is no mass.     Tenderness: There is abdominal tenderness. There is rebound. There is no right CVA tenderness, left CVA tenderness or guarding.      Comments: Hypoactive bowel sounds x4, TTP over RUQ/RLQ no hepatosplenomegaly  Musculoskeletal:     Cervical back: Normal range of motion and neck supple. Tenderness present.  Lymphadenopathy:     Cervical: Cervical adenopathy present.  Skin:    General: Skin is warm and dry.  Neurological:     General: No focal deficit present.     Mental Status: She is alert and oriented to person, place, and time. Mental status is at baseline.  Psychiatric:        Mood and Affect: Mood normal.        Behavior: Behavior normal.        Thought Content: Thought content normal.     UC Treatments / Results  Labs (all labs ordered are listed, but only abnormal results are displayed) Labs Reviewed  POCT URINALYSIS DIP (MANUAL ENTRY) - Abnormal; Notable for the following components:      Result Value   Ketones, POC UA trace (5) (*)    All other components within normal limits  URINE CULTURE  POCT URINE PREGNANCY    EKG   Radiology No results found.  Procedures Procedures (including critical care time)  Medications Ordered in UC Medications - No data to display  Initial Impression / Assessment and Plan / UC Course  I have reviewed the triage vital signs and the nursing notes.  Pertinent labs & imaging results that were available during my care of the patient were reviewed by me and considered in my medical decision making (see chart for details).     MDM: 1.  Right upper quadrant abdominal pain-Instructed/advised patient to go to [facility] now for further evaluation.  Work note provided per patient request.  Patient discharged, hemodynamically stable. Final Clinical Impressions(s) / UC Diagnoses   Final diagnoses:  Right upper quadrant abdominal pain     Discharge Instructions      Advised/instructed patient to go to [facility] now further evaluation of right upper quadrant abdominal pain.     ED Prescriptions   None    PDMP not reviewed this encounter.   Trevor Iha, FNP 04/24/21 1810

## 2021-04-24 NOTE — ED Triage Notes (Addendum)
Rt side Abdominal pain started today at 2pm Unvaccinated, denies nausea, vomiting

## 2021-04-26 LAB — URINE CULTURE
MICRO NUMBER:: 12275328
SPECIMEN QUALITY:: ADEQUATE

## 2021-04-26 NOTE — Telephone Encounter (Signed)
Pt went to ER and get an excuse from them

## 2021-04-30 ENCOUNTER — Emergency Department (HOSPITAL_COMMUNITY): Payer: Self-pay

## 2021-04-30 ENCOUNTER — Encounter (HOSPITAL_COMMUNITY): Payer: Self-pay

## 2021-04-30 ENCOUNTER — Other Ambulatory Visit: Payer: Self-pay

## 2021-04-30 ENCOUNTER — Ambulatory Visit (HOSPITAL_COMMUNITY)
Admission: EM | Admit: 2021-04-30 | Discharge: 2021-04-30 | Disposition: A | Discharge status: Other Acute Inpatient Hospital | Payer: No Payment, Other | Attending: Behavioral Health | Admitting: Behavioral Health

## 2021-04-30 ENCOUNTER — Encounter (HOSPITAL_BASED_OUTPATIENT_CLINIC_OR_DEPARTMENT_OTHER): Payer: Self-pay | Admitting: Emergency Medicine

## 2021-04-30 ENCOUNTER — Emergency Department (HOSPITAL_COMMUNITY)
Admission: EM | Admit: 2021-04-30 | Discharge: 2021-04-30 | Disposition: A | Discharge status: Home / Self Care | Payer: Self-pay | Attending: Emergency Medicine | Admitting: Emergency Medicine

## 2021-04-30 ENCOUNTER — Emergency Department (HOSPITAL_BASED_OUTPATIENT_CLINIC_OR_DEPARTMENT_OTHER)
Admission: EM | Admit: 2021-04-30 | Discharge: 2021-04-30 | Discharge status: Against Medical Advice | Payer: Self-pay | Attending: Emergency Medicine | Admitting: Emergency Medicine

## 2021-04-30 DIAGNOSIS — Z87891 Personal history of nicotine dependence: Secondary | ICD-10-CM | POA: Insufficient documentation

## 2021-04-30 DIAGNOSIS — F10230 Alcohol dependence with withdrawal, uncomplicated: Secondary | ICD-10-CM | POA: Insufficient documentation

## 2021-04-30 DIAGNOSIS — F0632 Mood disorder due to known physiological condition with major depressive-like episode: Secondary | ICD-10-CM | POA: Diagnosis not present

## 2021-04-30 DIAGNOSIS — F1020 Alcohol dependence, uncomplicated: Secondary | ICD-10-CM | POA: Diagnosis not present

## 2021-04-30 DIAGNOSIS — J45909 Unspecified asthma, uncomplicated: Secondary | ICD-10-CM | POA: Insufficient documentation

## 2021-04-30 DIAGNOSIS — I10 Essential (primary) hypertension: Secondary | ICD-10-CM | POA: Insufficient documentation

## 2021-04-30 DIAGNOSIS — Z7289 Other problems related to lifestyle: Secondary | ICD-10-CM

## 2021-04-30 DIAGNOSIS — R7401 Elevation of levels of liver transaminase levels: Secondary | ICD-10-CM | POA: Insufficient documentation

## 2021-04-30 DIAGNOSIS — Z20822 Contact with and (suspected) exposure to covid-19: Secondary | ICD-10-CM | POA: Insufficient documentation

## 2021-04-30 DIAGNOSIS — R0789 Other chest pain: Secondary | ICD-10-CM | POA: Insufficient documentation

## 2021-04-30 DIAGNOSIS — F1290 Cannabis use, unspecified, uncomplicated: Secondary | ICD-10-CM | POA: Insufficient documentation

## 2021-04-30 DIAGNOSIS — R079 Chest pain, unspecified: Secondary | ICD-10-CM

## 2021-04-30 DIAGNOSIS — Z79899 Other long term (current) drug therapy: Secondary | ICD-10-CM | POA: Insufficient documentation

## 2021-04-30 DIAGNOSIS — F10930 Alcohol use, unspecified with withdrawal, uncomplicated: Secondary | ICD-10-CM

## 2021-04-30 DIAGNOSIS — Z5321 Procedure and treatment not carried out due to patient leaving prior to being seen by health care provider: Secondary | ICD-10-CM | POA: Insufficient documentation

## 2021-04-30 DIAGNOSIS — Y906 Blood alcohol level of 120-199 mg/100 ml: Secondary | ICD-10-CM | POA: Insufficient documentation

## 2021-04-30 DIAGNOSIS — F1210 Cannabis abuse, uncomplicated: Secondary | ICD-10-CM

## 2021-04-30 DIAGNOSIS — Z789 Other specified health status: Secondary | ICD-10-CM

## 2021-04-30 LAB — CBC
HCT: 44.8 % (ref 36.0–46.0)
HCT: 42.8 % (ref 36.0–46.0)
Hemoglobin: 16 g/dL — ABNORMAL HIGH (ref 12.0–15.0)
Hemoglobin: 14.8 g/dL (ref 12.0–15.0)
MCH: 33.7 pg (ref 26.0–34.0)
MCH: 33 pg (ref 26.0–34.0)
MCHC: 35.7 g/dL (ref 30.0–36.0)
MCHC: 34.6 g/dL (ref 30.0–36.0)
MCV: 94.3 fL (ref 80.0–100.0)
MCV: 95.3 fL (ref 80.0–100.0)
Platelets: 125 10*3/uL — ABNORMAL LOW (ref 150–400)
Platelets: 94 10*3/uL — ABNORMAL LOW (ref 150–400)
RBC: 4.75 MIL/uL (ref 3.87–5.11)
RBC: 4.49 MIL/uL (ref 3.87–5.11)
RDW: 14.7 % (ref 11.5–15.5)
RDW: 14.6 % (ref 11.5–15.5)
WBC: 13.2 10*3/uL — ABNORMAL HIGH (ref 4.0–10.5)
WBC: 8.2 10*3/uL (ref 4.0–10.5)
nRBC: 0 % (ref 0.0–0.2)
nRBC: 0 % (ref 0.0–0.2)

## 2021-04-30 LAB — BASIC METABOLIC PANEL
Anion gap: 15 (ref 5–15)
BUN: <5 mg/dL — ABNORMAL LOW (ref 6–20)
CO2: 24 mmol/L (ref 22–32)
Calcium: 8.9 mg/dL (ref 8.9–10.3)
Chloride: 90 mmol/L — ABNORMAL LOW (ref 98–111)
Creatinine, Ser: 0.64 mg/dL (ref 0.44–1.00)
GFR, Estimated: >60 mL/min (ref >60)
Glucose, Bld: 87 mg/dL (ref 70–99)
Potassium: 3.4 mmol/L — ABNORMAL LOW (ref 3.5–5.1)
Sodium: 129 mmol/L — ABNORMAL LOW (ref 135–145)

## 2021-04-30 LAB — COMPREHENSIVE METABOLIC PANEL WITH GFR
ALT: 203 U/L — ABNORMAL HIGH (ref 0–44)
AST: 487 U/L — ABNORMAL HIGH (ref 15–41)
Albumin: 5 g/dL (ref 3.5–5.0)
Alkaline Phosphatase: 89 U/L (ref 38–126)
Anion gap: 22 — ABNORMAL HIGH (ref 5–15)
BUN: 5 mg/dL — ABNORMAL LOW (ref 6–20)
CO2: 19 mmol/L — ABNORMAL LOW (ref 22–32)
Calcium: 8.9 mg/dL (ref 8.9–10.3)
Chloride: 95 mmol/L — ABNORMAL LOW (ref 98–111)
Creatinine, Ser: 0.72 mg/dL (ref 0.44–1.00)
GFR, Estimated: >60 mL/min
Glucose, Bld: 139 mg/dL — ABNORMAL HIGH (ref 70–99)
Potassium: 3.7 mmol/L (ref 3.5–5.1)
Sodium: 136 mmol/L (ref 135–145)
Total Bilirubin: 1.2 mg/dL (ref 0.3–1.2)
Total Protein: 8.9 g/dL — ABNORMAL HIGH (ref 6.5–8.1)

## 2021-04-30 LAB — RAPID URINE DRUG SCREEN, HOSP PERFORMED
Amphetamines: NOT DETECTED
Barbiturates: POSITIVE — AB
Benzodiazepines: NOT DETECTED
Cocaine: NOT DETECTED
Opiates: NOT DETECTED
Tetrahydrocannabinol: POSITIVE — AB

## 2021-04-30 LAB — PREGNANCY, URINE: Preg Test, Ur: NEGATIVE

## 2021-04-30 LAB — RESP PANEL BY RT-PCR (FLU A&B, COVID) ARPGX2
Influenza A by PCR: NEGATIVE
Influenza B by PCR: NEGATIVE
SARS Coronavirus 2 by RT PCR: NEGATIVE

## 2021-04-30 LAB — TROPONIN I (HIGH SENSITIVITY): Troponin I (High Sensitivity): 6 ng/L (ref <18)

## 2021-04-30 LAB — ETHANOL: Alcohol, Ethyl (B): 138 mg/dL — ABNORMAL HIGH

## 2021-04-30 MED ORDER — PHENOBARBITAL SODIUM 65 MG/ML IJ SOLN
130.0000 mg | INTRAMUSCULAR | Status: DC | PRN
  Administered 2021-04-30 (×2): 130 mg via INTRAVENOUS
  Filled 2021-04-30 (×2): qty 2

## 2021-04-30 MED ORDER — THIAMINE HCL 100 MG/ML IJ SOLN
100.0000 mg | Freq: Once | INTRAMUSCULAR | Status: AC
  Administered 2021-04-30: 100 mg via INTRAVENOUS
  Filled 2021-04-30: qty 2

## 2021-04-30 MED ORDER — LACTATED RINGERS IV BOLUS
1000.0000 mL | Freq: Once | INTRAVENOUS | Status: AC
  Administered 2021-04-30: 1000 mL via INTRAVENOUS

## 2021-04-30 MED ORDER — PANTOPRAZOLE SODIUM 40 MG IV SOLR
40.0000 mg | Freq: Once | INTRAVENOUS | Status: AC
  Administered 2021-04-30: 40 mg via INTRAVENOUS
  Filled 2021-04-30: qty 40

## 2021-04-30 MED ORDER — ONDANSETRON HCL 4 MG/2ML IJ SOLN
4.0000 mg | Freq: Once | INTRAMUSCULAR | Status: DC
  Filled 2021-04-30: qty 2

## 2021-04-30 MED ORDER — LORAZEPAM 1 MG PO TABS
1.0000 mg | ORAL_TABLET | Freq: Once | ORAL | Status: AC
  Administered 2021-04-30: 1 mg via ORAL
  Filled 2021-04-30: qty 1

## 2021-04-30 NOTE — BH Assessment (Addendum)
Comprehensive Clinical Assessment (CCA) Note  04/30/2021 Cynthia Salazar [id number]  Chief Complaint: No chief complaint on file.  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, With major depressive-like episode F10.20 Alcohol use disorder, Severe  Flowsheet Row [facility] Most recent reading at 04/30/2021 10:26 AM ED [facility] Most recent reading at 04/30/2021  2:44 AM ED [facility] [place] Most recent reading at 04/24/2021  5:40 PM  C-SSRS RISK CATEGORY Low Risk No Risk No Risk      The patient demonstrates the following risk factors for suicide: Chronic risk factors for suicide include: psychiatric disorder of major depressive disorder, substance use disorder, and previous self-harm by cutting . Acute risk factors for suicide include: family or marital conflict and social withdrawal/isolation. Protective factors for this patient include: positive social support, positive therapeutic relationship, coping skills, hope for the future, and life satisfaction. Considering these factors, the overall suicide risk at this point appears to be low. Patient is not appropriate for outpatient follow up.  Low risk = tele sitter  Disposition: Vernard Gamblesarolyn Coleman NP, recommends inpatient and [facility] due to illness: chest pains for medical clarence.  Cynthia Salazar 30 years old [facility] and accompanied by her mother, Fritzi MandesKelly Salazar, [phone].  Pt reports she has a history of bipolar disorder, anxiety and depression.  Pt reports that she is withdrawing from alcohol and experiencing tremors, "I am experiencing tighten chest pains".  Pt reports that she left the [facility] earlier this morning. Pt reports two or three hours of sleep during the night; also reports that she is eating once a day.  Pt acknowledged the  following symptoms: isolating, irritable, hopelessness and anxious.  Pt denies SI, HI and AVH.  Pt reported cutting her arms two years ago.  Pt denies paranoia.  Pt admits to drinking one pint or more of alcohol daily; also admits to smoking marijuana three times a week.  Pt unable to identify any primary stressor.  Pt mom reports that she lives alone and works at [employer].  Pt reports no family history of mental illness or substance use. Pt denies any hisory of abuse or trama.  Pt denies any current legal problems.  Pt denies any weapons or guns in the house.  Pt says she is not currently receiving weekly outpatient therapy; reports that she is not taking prescribed medication (Lexapro), which was provided by Reuel DerbyStacy Blyth, primary physician. Pt reports no previous inpatient psychiatric hospitalization.  Pt reports she as a diagnosis of HBP.  Pt is dressed casual, alert oriented x 5 with slow speech and restless motor behavior.  Eye contact  is normal and pt is tearful.  Pt mood is anxious and affect is anxious.  Thought process is coherent.  Pt's insight is lacking and judgment is impaired.  There is no indication Pt is currently responding to internal stimuli or experiencing delusional thought content.  Pt was cooperative throughout assessment.    CCA Screening, Triage and Referral (STR)  Patient Reported Information How did you hear about us? Family/Friend  What Is the Reason for Your Visit/Call Today? Depression, Alcohol withdrawals  How Long Has This Been Causing You Problems? <Week  What Do You Feel Would Help You the Most Today? Alcohol or Drug Use Treatment; Treatment for Depression or other mood problem   Have You Recently Had Any Thoughts  About Hurting Yourself? No  Are You Planning to Commit Suicide/Harm Yourself At This time? No   Have you Recently Had Thoughts About Hurting Someone [name]? No  Are You Planning to Harm Someone at This Time? No  Explanation: No data  recorded  Have You Used Any Alcohol or Drugs in the Past 24 Hours? Yes  How Long Ago Did You Use Drugs or Alcohol? No data recorded What Did You Use and How Much? Alcohol, pint of liqour   Do You Currently Have a Therapist/Psychiatrist? No data recorded Name of Therapist/Psychiatrist: No data recorded  Have You Been Recently Discharged From Any Office Practice or Programs? No data recorded Explanation of Discharge From Practice/Program: No data recorded    CCA Screening Triage Referral Assessment Type of Contact: No data recorded Telemedicine Service Delivery:   Is this Initial or Reassessment? No data recorded Date Telepsych consult ordered in [facility]:  No data recorded Time Telepsych consult ordered in [facility]:  No data recorded Location of Assessment: No data recorded Provider Location: No data recorded  Collateral Involvement: No data recorded  Does Patient Have a Court Appointed Legal Guardian? No data recorded Name and Contact of Legal Guardian: No data recorded If Minor and Not Living with Parent(s), Who has Custody? No data recorded Is CPS involved or ever been involved? No data recorded Is APS involved or ever been involved? No data recorded  Patient Determined To Be At Risk for Harm To Self or Others Based on Review of Patient Reported Information or Presenting Complaint? No data recorded Method: No data recorded Availability of Means: No data recorded Intent: No data recorded Notification Required: No data recorded Additional Information for Danger to Others Potential: No data recorded Additional Comments for Danger to Others Potential: No data recorded Are There Guns or Other Weapons in Your Home? No data recorded Types of Guns/Weapons: No data recorded Are These Weapons Safely Secured?                            No data recorded Who Could Verify You Are Able To Have These Secured: No data recorded Do You Have any Outstanding Charges, Pending Court Dates,  Parole/Probation? No data recorded Contacted To Inform of Risk of Harm To Self or Others: No data recorded   Does Patient Present under Involuntary Commitment? No data recorded IVC Papers Initial File Date: No data recorded  [place] of Residence: No data recorded  Patient Currently Receiving the Following Services: No data recorded  Determination of Need: Urgent (48 hours)   Options For Referral: [facility] Urgent Care     CCA Biopsychosocial Patient Reported Schizophrenia/Schizoaffective Diagnosis in Past: No   Strengths: Asking questions   Mental Health Symptoms Depression:   Difficulty Concentrating; Fatigue; Irritability; Worthlessness; Hopelessness   Duration of Depressive symptoms:  Duration of Depressive Symptoms: Less than two weeks   Mania:   None   Anxiety:    Restlessness; Sleep; Worrying   Psychosis:   None   Duration of Psychotic symptoms:    Trauma:   None   Obsessions:   Disrupts routine/functioning   Compulsions:   Disrupts with routine/functioning   Inattention:   None   Hyperactivity/Impulsivity:   None   Oppositional/Defiant Behaviors:   None   Emotional Irregularity:   None   Other Mood/Personality Symptoms:   depressed/irritable    Mental Status Exam Appearance and self-care  Stature:   Average   Weight:  Average weight   Clothing:   Disheveled   Grooming:   Neglected   Cosmetic use:   Age appropriate   Posture/gait:   Normal   Motor activity:   Agitated   Sensorium  Attention:   Confused   Concentration:   Anxiety interferes   Orientation:   X5   Recall/memory:   Normal   Affect and Mood  Affect:   Anxious; Depressed   Mood:   Anxious; Depressed   Relating  Eye contact:   None   Facial expression:   Sad; Depressed; Anxious   Attitude toward examiner:   Cooperative   Thought and Language  Speech flow:  Slow   Thought content:   Suspicious   Preoccupation:   None    Hallucinations:   None   Organization:  No data recorded  [employer] of Knowledge:   Average   Intelligence:   Average   Abstraction:   Functional   Judgement:   Impaired   Reality Testing:   Adequate   Insight:   Lacking   Decision Making:   Confused   Social Functioning  Social Maturity:   Isolates   Social Judgement:   Impropriety   Stress  Stressors:   Family conflict   Coping Ability:   Human resources officer Deficits:   Self-care; Self-control   Supports:   Family     Religion: Religion/Spirituality How Might This Affect Treatment?: UTA  Leisure/Recreation: Leisure / Recreation Do You Have Hobbies?: Yes Leisure and Hobbies: painting, art  Exercise/Diet: Exercise/Diet Have You Gained or Lost A Significant Amount of Weight in the Past Six Months?: No Do You Follow a Special Diet?: Yes Type of Diet: Pt report she eat once a day Do You Have Any Trouble Sleeping?: Yes Explanation of Sleeping Difficulties: Pt reports that she sleep two hours during the day.   CCA Employment/Education Employment/Work Situation: Employment / Work Clinical biochemist has Been Impacted by Current Illness: No Has Patient ever Been in the [employer]?: No  Education: Education Is Patient Currently Attending School?: Yes School Currently Attending: n/a Last Grade Completed: 12 Did You Attend College?: Yes What Type of College Degree Do you Have?: Pt mom reports that she attended community college. Did You Have An Individualized Education Program (IIEP): No Did You Have Any Difficulty At School?: No Patient's Education Has Been Impacted by Current Illness: No   CCA Family/Childhood History Family and Relationship History: Family history Does patient have children?: No  Childhood History:  Childhood History By whom was/is the patient raised?: Both parents Did patient suffer any verbal/emotional/physical/sexual abuse as a child?: No Did  patient suffer from severe childhood neglect?: No Has patient ever been sexually abused/assaulted/raped as an adolescent or adult?: No Was the patient ever a victim of a crime or a disaster?: No Has patient been affected by domestic violence as an adult?: No  Child/Adolescent Assessment:     CCA Substance Use Alcohol/Drug Use: Alcohol / Drug Use Pain Medications: See MRA Prescriptions: See MRA Over the Counter: See MRA History of alcohol / drug use?: Yes Longest period of sobriety (when/how long): No sobreity Negative Consequences of Use: Personal relationships Withdrawal Symptoms: Agitation, Irritability Substance #1 Name of Substance 1: Alcohol 1 - Age of First Use: UTA 1 - Amount (size/oz): 1 pint or more 1 - Frequency: daily 1 - Duration: ongoing 1 - Last Use / Amount: 04/29/21 1 - Method of Aquiring: purchased 1- Route of Use: drinking Substance #  2 Name of Substance 2: Marijuana 2 - Age of First Use: UTA 2 - Amount (size/oz): bowl 2 - Frequency: 3 x week 2 - Duration: ongoing 2 - Last Use / Amount: 04/29/21 2 - Method of Aquiring: UTA 2 - Route of Substance Use: smoking                     ASAM's:  Six Dimensions of Multidimensional Assessment  Dimension 1:  Acute Intoxication and/or Withdrawal Potential:   Dimension 1:  Description of individual's past and current experiences of substance use and withdrawal: Pt reports daily current using alcohol and occassions use marijuana  Dimension 2:  Biomedical Conditions and Complications:   Dimension 2:  Description of patient's biomedical conditions and  complications: HBP  Dimension 3:  Emotional, Behavioral, or Cognitive Conditions and Complications:  Dimension 3:  Description of emotional, behavioral, or cognitive conditions and complications: Depression, anxiety  Dimension 4:  Readiness to Change:  Dimension 4:  Description of Readiness to Change criteria: precontemplation  Dimension 5:  Relapse, Continued use,  or Continued Problem Potential:  Dimension 5:  Relapse, continued use, or continued problem potential critiera description: Pt reports that she wants to stop using alcohol  Dimension 6:  Recovery/Living Environment:  Dimension 6:  Recovery/Iiving environment criteria description: Pt lives alone in safe enviorment.  ASAM Severity Score: ASAM's Severity Rating Score: 12  ASAM Recommended Level of Treatment: ASAM Recommended Level of Treatment: Level II Partial Hospitalization Treatment   Substance use Disorder (SUD) Substance Use Disorder (SUD)  Checklist Symptoms of Substance Use: Continued use despite having a persistent/recurrent physical/psychological problem caused/exacerbated by use, Continued use despite persistent or recurrent social, interpersonal problems, caused or exacerbated by use, Evidence of tolerance, Evidence of withdrawal (Comment), Large amounts of time spent to obtain, use or recover from the substance(s), Persistent desire or unsuccessful efforts to cut down or control use, Presence of craving or strong urge to use, Repeated use in physically hazardous situations, Substance(s) often taken in larger amounts or over longer times than was intended  Recommendations for Services/Supports/Treatments: Recommendations for Services/Supports/Treatments Recommendations For Services/Supports/Treatments: Inpatient Hospitalization  Discharge Disposition: Discharge Disposition Medical Exam completed: Yes Disposition of Patient: Transfer, Movement to [facility] or [facility] ED Mode of transportation if patient is discharged/movement?: Other (comment) (Transported by EMS)  DSM5 Diagnoses: Patient Active Problem List   Diagnosis Date Noted   Labile blood pressure 08/06/2018   Reflux involving intestinal tract 10/31/2017   Heavy menstrual bleeding 10/31/2017   Alcohol use (HCC) 06/03/2015   Overweight 04/05/2014   Low back pain 11/18/2013   Hyperlipidemia, mixed 03/18/2013   Abnormal liver function  test 03/18/2013   Insomnia 03/17/2013   Mild intermittent asthma 05/29/2012   Preventative health care 05/28/2012   Depression with anxiety    Allergic state      Referrals to Alternative Service(s): Referred to Alternative Service(s):   Place:   Date:   Time:    Referred to Alternative Service(s):   Place:   Date:   Time:    Referred to Alternative Service(s):   Place:   Date:   Time:    Referred to Alternative Service(s):   Place:   Date:   Time:     Meryle Ready, Counselor

## 2021-04-30 NOTE — ED Notes (Signed)
Pt stated cannot wait 3 hours and was leaving.

## 2021-04-30 NOTE — ED Notes (Signed)
Pt d/c from facility to [facility] via EMS. Report previously called to Marijean Niemann, Charity fundraiser at [facility]. Report given to EMS staff. Mom present. Personal belongings returned from orange locker. Safety maintained.

## 2021-04-30 NOTE — ED Triage Notes (Signed)
Pt reports desire for ETOH detox. Last ETOH was yesterday. She has been drinking ~ a pint a day for the "last few months". Pt is tremulous, hypertensive, tachycardic and has been vomiting (5-6x).

## 2021-04-30 NOTE — ED Provider Notes (Signed)
Emergency Medicine Provider Triage Evaluation Note  Cynthia Salazar , a 30 y.o. female  was evaluated in triage.    Patient reports she was sent into this emergency department for chest tightness from behavioral health urgent care.  Patient reports that she is currently in alcohol withdrawal last drink was yesterday.  Patient reports that she went to med [facility] for her symptoms and then went over to behavioral health urgent care.  While at behavioral the urgent care patient reported some chest tightness and they sent her to this ER for evaluation.  Patient reports tightness is mild constant nonradiating no clear aggravating alleviating factors.  Patient reports similar symptoms in the past with alcohol withdrawal.  Reports also uses marijuana, denies any other illicit substances.  Review of Systems  Positive: Chest tightness Negative: Fever, chills fall, injury, cough/hemoptysis, abd pain, vomiting, diarrhea, auditory/visual hallucinations, suicidal/homicidal ideations,  history of seizures, history of delirium tremens additional concerns  Physical Exam  BP (!) 154/115 (BP Location: Left Arm)   Pulse (!) 126   Temp 99.7 F (37.6 C) (Oral)   Resp 16   Ht 5\' 1"  (1.549 m)   Wt 68 kg   LMP 04/09/2021   SpO2 100%   BMI 28.33 kg/m  Gen:   Awake, faint tremors noted, no acute distress Resp:  Normal effort, lungs clear to auscultation MSK:   Moves extremities without difficulty  Other:  Heart regular rate and rhythm.  Denies SI/HI or hallucinations  Medical Decision Making  Medically screening exam initiated at 12:02 PM.  Appropriate orders placed.  Cynthia Salazar was informed that the remainder of the evaluation will be completed by another provider, this initial triage assessment does not replace that evaluation, and the importance of remaining in the ED until their evaluation is complete.  Chest pain order set utilized.  Chart review shows patient was being treated at [facility] and alcohol level was elevated at that time so they offered to admit the patient patient refused and left AMA.  I discussed this case with attending physician Dr. [facility], will give Ativan for acute alcohol withdrawal and patient will be seen in the main ER.  Note: Portions of this report may have been transcribed using voice recognition software. Every effort was made to ensure accuracy; however, inadvertent computerized transcription errors may still be present.    Renaye Rakers 04/30/21 1211    05/02/21, MD 04/30/21 [phone]

## 2021-04-30 NOTE — ED Notes (Signed)
Alert, calm and cooperative, safety measures in place, sr x 2 up, call bell within reach, room door left open for cont observation due to ED presentation signs and symptoms

## 2021-04-30 NOTE — ED Provider Notes (Signed)
Behavioral Health Urgent Care Medical Screening Exam  Patient Name: Cynthia Salazar MRN: [id number] Date of Evaluation: 04/30/21 Chief Complaint:   Diagnosis:  Final diagnoses:  Alcohol use (HCC)    History of Present illness: Cynthia Salazar is a 30 y.o. female patient presented to [facility] as a walk in accompanied by her mother with complaints of "I need help with detox".  Cynthia Salazar, 30 y.o., female patient seen face to face by this provider, consulted with Dr. Lucianne Muss; and chart reviewed on 04/30/21.  On evaluation Cynthia Salazar reports she was discharged from [facility] this AM.  She reports provider discharged her and told her to come to the [facility] for detox.  However, provider note states patient left AMA.  While she was being treated she was given IV phenobarbital and was in active withdrawal.  During evaluation Cynthia Salazar is in sitting position. She is in acute distress. She is clammy, makes fleeting eye contact.  She has an ill appearance.  She is tearful and tremulous.  Her speech is clear and coherent but decreased. She is alert/oriented x 4. She is depressed and anxious with congruent affect. Her thought process is coherent and relevant; There is no indication that she is currently responding to internal/external stimuli or experiencing delusional thought content; ands he has denied suicidal/self-harm/homicidal ideation, psychosis, and paranoia.    Patient reports she drinks 1 pint of liquor per day sometimes more.  States her last drink was yesterday.  Reports currently she is having a constant chest pain 7/10.  States it is a pressure/crushing pain that.  She is having numbness in her hands, nausea, stomach pain, and she is tremulous.  Her blood pressure is 162/117 and pulse rate is 122.  Current CIWA is 6.  Patient endorses marijuana use, no other illegal substance. States she currently does not take any medications.   Patient is in acute distress.  EMS will  transport patient to [facility] emergency department    Psychiatric Specialty Exam  Presentation  General Appearance:Disheveled  Eye Contact:Fleeting  Speech:Clear and Coherent; Normal Rate  Speech Volume:Decreased  Handedness:Right   Mood and Affect  Mood:Depressed; Anxious  Affect:Congruent   Thought Process  Thought Processes:Coherent  Descriptions of Associations:Intact  Orientation:Full (Time, Place and Person)  Thought Content:Logical    Hallucinations:None  Ideas of Reference:None  Suicidal Thoughts:No  Homicidal Thoughts:No   Sensorium  Memory:Immediate Fair; Recent Fair; Remote Fair  Judgment:Poor  Insight:Fair   Executive Functions  Concentration:Fair  Attention Span:Fair  Recall:Fair  Fund of Knowledge:Fair  Language:Fair   Psychomotor Activity  Psychomotor Activity:Normal   Assets  Assets:Communication Skills; Desire for Improvement; Financial Resources/Insurance; Housing; Physical Health; Resilience   Sleep  Sleep:Fair  Number of hours:  No data recorded  No data recorded  Physical Exam: Physical Exam Vitals and nursing note reviewed.  Constitutional:      General: She is in acute distress.     Appearance: She is ill-appearing.  HENT:     Head: Normocephalic.  Eyes:     Conjunctiva/sclera: Conjunctivae normal.  Cardiovascular:     Rate and Rhythm: Tachycardia present.  Pulmonary:     Effort: Pulmonary effort is normal.  Musculoskeletal:        General: Normal range of motion.     Cervical back: Normal range of motion.  Skin:    Coloration: Skin is not jaundiced or pale.  Neurological:     Mental Status: She is alert and oriented to person, place,  and time.  Psychiatric:        Attention and Perception: She is inattentive.        Mood and Affect: Mood is anxious and depressed. Affect is tearful.        Speech: Speech normal.        Behavior: Behavior is cooperative.        Thought Content: Thought  content normal.        Cognition and Memory: Cognition normal.        Judgment: Judgment is impulsive.   Review of Systems  HENT:  Positive for hearing loss.   Eyes: Negative.   Respiratory:  Positive for shortness of breath. Negative for cough.   Cardiovascular:  Positive for chest pain.  Musculoskeletal: Negative.   Skin: Negative.   Neurological:  Positive for tingling, tremors and weakness.  Psychiatric/Behavioral:  Positive for depression. The patient is nervous/anxious.   Blood pressure (!) 162/117, pulse (!) 122, temperature 99.6 F (37.6 C), resp. rate 18, last menstrual period 04/09/2021, SpO2 98 %. There is no height or weight on file to calculate BMI.  Musculoskeletal: Strength & Muscle Tone: within normal limits Gait & Station: normal Patient leans: N/A   [facility] Discharge Disposition for Follow up and Recommendations: Based on my evaluation the patient appears to have an emergency medical condition for which I recommend the patient be transferred to the emergency department for further evaluation.   She is tremulousness, nauseas, c/o chest pain 7/10, epigastric pain,  Bp 162/117 pulse 122. Ciwa 6  Transferred to [facility].   Dr. Freida Busman and charge nurse Asher Muir notified and Dr. Freida Busman accepted patient   Patient will be placed on TTS consult list. Once she is medically cleared and not in active withdrawal. She will be reassessed and could possibly be a good candidate for [facility].    Ardis Hughs, NP 04/30/2021, 10:23 AM

## 2021-04-30 NOTE — ED Provider Notes (Signed)
[facility] Provider Note: Lowella Dell, MD, FACEP  CSN: [id number] MRN: [id number] ARRIVAL: 04/30/21 at 0223 ROOM: MH12/MH12   CHIEF COMPLAINT  Delirium Tremens (DTS)   HISTORY OF PRESENT ILLNESS  04/30/21 3:28 AM Cynthia Salazar is a 30 y.o. female who would like to be detoxed from alcohol.  Her last drink was yesterday.  She estimates she had been drinking a pint of liquor a day for the past several months.  The patient was noted to be tremulous, hypertensive and tachycardic in triage with a high CIWA score.  She has also been vomiting, 5 or 6 times.  She is having epigastric pain which she rates as a 4 out of 10.  He describes it as a pressure and it is not worse with palpation or movement.  She states she has had alcohol withdrawals in the past but these are worse.  She is not having hallucinations, racing thoughts, sense of impending doom or suicidal ideation.   Past Medical History:  Diagnosis Date   Abnormal liver function test 03/18/2013   Alcohol use 06/03/2015   Allergy    Asthma    Asthma 05/29/2012   Asthma 05/29/2012   Chicken pox as a child   Depression 56 yrs old   Depression with anxiety    Essential hypertension 06/03/2015   Insomnia 03/17/2013   Other and unspecified hyperlipidemia 03/18/2013   Overweight 04/05/2014   Preventative health care 05/28/2012    Past Surgical History:  Procedure Laterality Date   WISDOM TOOTH EXTRACTION  19 yrs    Family History  Problem Relation Age of Onset   Nephrolithiasis Father    Hypertension Father    Nephrolithiasis Brother    COPD Paternal Grandmother     Social History   Tobacco Use   Smoking status: Former   Smokeless tobacco: Never   Tobacco comments:    occasionally smoked one  Vaping Use   Vaping Use: Never used  Substance Use Topics   Alcohol use: Yes    Comment: 1 pint per day    Drug use: No    Prior to Admission medications   Not on File    Allergies Aleve [naproxen  sodium]   REVIEW OF SYSTEMS  Negative except as noted here or in the History of Present Illness.   PHYSICAL EXAMINATION  Initial Vital Signs Blood pressure (!) 147/119, pulse (!) 126, temperature 98.9 F (37.2 C), temperature source Oral, resp. rate (!) 22, height 5\' 1"  (1.549 m), weight 68 kg, last menstrual period 04/09/2021, SpO2 95 %.  Examination General: Well-developed, well-nourished female in no acute distress; appearance consistent with age of record HENT: normocephalic; atraumatic Eyes: pupils equal, round and reactive to light; extraocular muscles intact Neck: supple Heart: regular rate and rhythm; tachycardia Lungs: clear to auscultation bilaterally Abdomen: soft; nondistended; nontender; bowel sounds present Extremities: No deformity; full range of motion; pulses normal Neurologic: Awake, alert and oriented; motor function intact in all extremities and symmetric; no facial droop; tremulous Skin: Warm and dry Psychiatric: Flat affect   RESULTS  Summary of this visit's results, reviewed and interpreted by myself:   EKG Interpretation  Date/Time:    Ventricular Rate:    PR Interval:    QRS Duration:   QT Interval:    QTC Calculation:   R Axis:     Text Interpretation:         Laboratory Studies: Results for orders placed or performed during the hospital encounter of 04/30/21 (from  the past 24 hour(s))  Comprehensive metabolic panel     Status: Abnormal   Collection Time: 04/30/21  3:00 AM  Result Value Ref Range   Sodium 136 135 - 145 mmol/L   Potassium 3.7 3.5 - 5.1 mmol/L   Chloride 95 (L) 98 - 111 mmol/L   CO2 19 (L) 22 - 32 mmol/L   Glucose, Bld 139 (H) 70 - 99 mg/dL   BUN 5 (L) 6 - 20 mg/dL   Creatinine, Ser 7.12 0.44 - 1.00 mg/dL   Calcium 8.9 8.9 - 45.8 mg/dL   Total Protein 8.9 (H) 6.5 - 8.1 g/dL   Albumin 5.0 3.5 - 5.0 g/dL   AST 099 (H) 15 - 41 U/L   ALT 203 (H) 0 - 44 U/L   Alkaline Phosphatase 89 38 - 126 U/L   Total Bilirubin 1.2 0.3  - 1.2 mg/dL   GFR, Estimated >83 >38 mL/min   Anion gap 22 (H) 5 - 15  Ethanol     Status: Abnormal   Collection Time: 04/30/21  3:00 AM  Result Value Ref Range   Alcohol, Ethyl (B) 138 (H) <10 mg/dL  cbc     Status: Abnormal   Collection Time: 04/30/21  3:00 AM  Result Value Ref Range   WBC 13.2 (H) 4.0 - 10.5 K/uL   RBC 4.75 3.87 - 5.11 MIL/uL   Hemoglobin 16.0 (H) 12.0 - 15.0 g/dL   HCT 25.0 53.9 - 76.7 %   MCV 94.3 80.0 - 100.0 fL   MCH 33.7 26.0 - 34.0 pg   MCHC 35.7 30.0 - 36.0 g/dL   RDW 34.1 93.7 - 90.2 %   Platelets 125 (L) 150 - 400 K/uL   nRBC 0.0 0.0 - 0.2 %  Rapid urine drug screen (hospital performed)     Status: Abnormal   Collection Time: 04/30/21  5:03 AM  Result Value Ref Range   Opiates NONE DETECTED NONE DETECTED   Cocaine NONE DETECTED NONE DETECTED   Benzodiazepines NONE DETECTED NONE DETECTED   Amphetamines NONE DETECTED NONE DETECTED   Tetrahydrocannabinol POSITIVE (A) NONE DETECTED   Barbiturates POSITIVE (A) NONE DETECTED  Pregnancy, urine     Status: None   Collection Time: 04/30/21  5:03 AM  Result Value Ref Range   Preg Test, Ur NEGATIVE NEGATIVE   Imaging Studies: No results found.  ED COURSE and MDM  Nursing notes, initial and subsequent vitals signs, including pulse oximetry, reviewed and interpreted by myself.  Vitals:   04/30/21 0500 04/30/21 0529 04/30/21 0530 04/30/21 0530  BP: (!) 134/97 (!) 152/111 (!) 152/111 (!) 152/111  Pulse: (!) 109 (!) 116 (!) 122 (!) 116  Resp: 16  (!) 21   Temp:      TempSrc:      SpO2: 97% 99% 95%   Weight:      Height:       Medications  ondansetron (ZOFRAN) injection 4 mg (4 mg Intravenous Not Given 04/30/21 0513)  PHENObarbital (LUMINAL) injection 130 mg (130 mg Intravenous Given 04/30/21 0425)  lactated ringers bolus 1,000 mL (1,000 mLs Intravenous New Bag/Given 04/30/21 0421)  thiamine (B-1) injection 100 mg (100 mg Intravenous Given 04/30/21 0505)  pantoprazole (PROTONIX) injection 40 mg (40 mg  Intravenous Given 04/30/21 0455)   5:44 AM Patient's symptoms improved after IV phenobarbital.  Additional doses ordered.  Patient's alcohol level was 138 which indicates she has significant tolerance to alcohol given that she is withdrawing at a level that is  legally intoxicated.  6:28 AM Patient has decided she wishes to leave and not be admitted.  The patient was advised that this is not what we recommend but she is free to make her own decisions.  She was also advised that alcohol withdrawal can be a life-threatening condition.  She is also free to return at any time should she change her mind.  We will have her sign out AGAINST MEDICAL ADVICE.  PROCEDURES  Procedures   ED DIAGNOSES     ICD-10-CM   1. Alcohol withdrawal syndrome without complication (HCC)  F10.230     2. Elevated transaminase level  R74.01          Tameya Kuznia, Jonny Ruiz, MD 04/30/21 [phone]

## 2021-04-30 NOTE — ED Notes (Signed)
Report called to Marijean Niemann, RN at [facility] and 911 called. Pt c/o CP. Another RN is currently with Pt. 144/122 122 18 98%RA. Mom is also with PT. Safety maintained, awaiting EMS and will continue to monitor.

## 2021-04-30 NOTE — BH Assessment (Signed)
Cynthia Salazar, Urgent, MR #[id number]; 30 years old presents this date with her mother Fritzi Mandes, [phone].  Pt reports drinking alcohol on 04/29/21, currently having withdrawals from  the alcohol.  Pt denies SI, HI, or AVH.  Pt admits to piror MH diagnosis; also, currently taking prescribed medication for symptom management. Pt signed MSE.

## 2021-04-30 NOTE — Discharge Instructions (Addendum)
Will be transferred to [facility]

## 2021-04-30 NOTE — ED Triage Notes (Signed)
Patient arrives for evaluation of chest pressure. Patient states that the chest discomfort started this morning. Rates discomfort a 4/10. Patient went to seek ETOH withdrawal treatment at [facility], and they were concerned about her chest pain. Sent here via EMS for evaluation.

## 2021-05-02 ENCOUNTER — Encounter: Payer: Self-pay | Admitting: Family Medicine

## 2021-05-03 NOTE — Telephone Encounter (Signed)
Left message on machine to call back to schedule appointment.  

## 2021-05-21 ENCOUNTER — Emergency Department (HOSPITAL_BASED_OUTPATIENT_CLINIC_OR_DEPARTMENT_OTHER): Admission: EM | Admit: 2021-05-21 | Discharge: 2021-05-21 | Discharge status: Absent without leave | Payer: Self-pay

## 2021-05-29 ENCOUNTER — Other Ambulatory Visit: Payer: Self-pay

## 2021-05-29 ENCOUNTER — Telehealth: Payer: Self-pay

## 2021-05-29 ENCOUNTER — Encounter: Payer: Self-pay | Admitting: Family Medicine

## 2021-05-29 ENCOUNTER — Ambulatory Visit (INDEPENDENT_AMBULATORY_CARE_PROVIDER_SITE_OTHER): Payer: Self-pay | Admitting: Family Medicine

## 2021-05-29 DIAGNOSIS — R945 Abnormal results of liver function studies: Secondary | ICD-10-CM

## 2021-05-29 DIAGNOSIS — Z789 Other specified health status: Secondary | ICD-10-CM

## 2021-05-29 DIAGNOSIS — R0989 Other specified symptoms and signs involving the circulatory and respiratory systems: Secondary | ICD-10-CM

## 2021-05-29 DIAGNOSIS — R7989 Other specified abnormal findings of blood chemistry: Secondary | ICD-10-CM

## 2021-05-29 DIAGNOSIS — F1090 Alcohol use, unspecified, uncomplicated: Secondary | ICD-10-CM

## 2021-05-29 DIAGNOSIS — F418 Other specified anxiety disorders: Secondary | ICD-10-CM

## 2021-05-29 DIAGNOSIS — Z7289 Other problems related to lifestyle: Secondary | ICD-10-CM

## 2021-05-29 MED ORDER — ESCITALOPRAM OXALATE 10 MG PO TABS: 10.0000 mg | ORAL_TABLET | Freq: Every day | ORAL | 3 refills | Status: DC

## 2021-05-29 NOTE — Progress Notes (Signed)
Subjective:   By signing my name below, I, Cynthia Salazar, attest that this documentation has been prepared under the direction and in the presence of Bradd Canary, MD. 05/29/2021   Patient ID: Cynthia Salazar, female    DOB: Dec 20, 1990, 30 y.o.   MRN: [id number]  Chief Complaint  Patient presents with   Medication Refill    HPI Patient is in today for an office visit and medication refills.  She states she is doing well at this time. She is 1 week sober. She is in a new relationship and is trying to stay busy by hiking and cleaning.  Her blood pressure is stable at this moment. BP Readings from Last 3 Encounters:  05/29/21 118/82  04/30/21 (!) 154/115  04/30/21 (!) 144/92      She is requesting a refill on 10 mg lexapro.   She is not working at the moment. She is about to start working at an [employer].  She is not interested in taking the Covid-19 or flu vaccines.   Past Medical History:  Diagnosis Date   Abnormal liver function test 03/18/2013   Alcohol use 06/03/2015   Allergy    Asthma    Asthma 05/29/2012   Asthma 05/29/2012   Chicken pox as a child   Depression 60 yrs old   Depression with anxiety    Essential hypertension 06/03/2015   Insomnia 03/17/2013   Other and unspecified hyperlipidemia 03/18/2013   Overweight 04/05/2014   Preventative health care 05/28/2012    Past Surgical History:  Procedure Laterality Date   WISDOM TOOTH EXTRACTION  19 yrs    Family History  Problem Relation Age of Onset   Nephrolithiasis Father    Hypertension Father    Nephrolithiasis Brother    COPD Paternal Grandmother     Social History   Socioeconomic History   Marital status: Single    Spouse name: Not on file   Number of children: Not on file   Years of education: Not on file   Highest education level: Not on file  Occupational History   Not on file  Tobacco Use   Smoking status: Former   Smokeless tobacco: Never   Tobacco comments:    occasionally  smoked one  Vaping Use   Vaping Use: Never used  Substance and Sexual Activity   Alcohol use: Yes    Comment: 1 pint per day    Drug use: No   Sexual activity: Yes    Partners: Female  Other Topics Concern   Not on file  Social History Narrative   Not on file   Social Determinants of Health   Financial Resource Strain: Not on file  Food Insecurity: Not on file  Transportation Needs: Not on file  Physical Activity: Not on file  Stress: Not on file  Social Connections: Not on file  Intimate Partner Violence: Not on file    No outpatient medications prior to visit.   No facility-administered medications prior to visit.    Allergies  Allergen Reactions   Aleve [Naproxen Sodium]     wheeze    Review of Systems  Constitutional:  Negative for fever and malaise/fatigue.  HENT:  Negative for congestion.   Eyes:  Negative for pain and redness.  Respiratory:  Negative for shortness of breath.   Cardiovascular:  Negative for chest pain, palpitations and leg swelling.  Gastrointestinal:  Negative for abdominal pain, blood in stool, diarrhea and nausea.  Genitourinary:  Negative  for dysuria and frequency.  Musculoskeletal:  Negative for back pain and falls.  Skin:  Negative for rash.  Neurological:  Negative for dizziness, loss of consciousness and headaches.  Endo/Heme/Allergies:  Negative for polydipsia.  Psychiatric/Behavioral:  Negative for depression. The patient is not nervous/anxious.       Objective:    Physical Exam Constitutional:      General: She is not in acute distress.    Appearance: She is well-developed.  HENT:     Head: Normocephalic and atraumatic.  Eyes:     Conjunctiva/sclera: Conjunctivae normal.  Neck:     Thyroid: No thyromegaly.  Cardiovascular:     Rate and Rhythm: Normal rate and regular rhythm.     Heart sounds: Normal heart sounds. No murmur heard. Pulmonary:     Effort: Pulmonary effort is normal. No respiratory distress.     Breath  sounds: Normal breath sounds.  Abdominal:     General: Bowel sounds are normal. There is no distension.     Palpations: Abdomen is soft. There is no mass.     Tenderness: There is no abdominal tenderness.  Musculoskeletal:     Cervical back: Neck supple.  Lymphadenopathy:     Cervical: No cervical adenopathy.  Skin:    General: Skin is warm and dry.  Neurological:     Mental Status: She is alert and oriented to person, place, and time.  Psychiatric:        Behavior: Behavior normal.    BP 118/82   Pulse 81   Temp 98 F (36.7 C)   Resp 16   Wt 157 lb (71.2 kg)   SpO2 99%   BMI 29.66 kg/m  Wt Readings from Last 3 Encounters:  05/29/21 157 lb (71.2 kg)  04/30/21 149 lb 14.6 oz (68 kg)  04/30/21 150 lb (68 kg)    Diabetic Foot Exam - Simple   No data filed    Lab Results  Component Value Date   WBC 8.2 04/30/2021   HGB 14.8 04/30/2021   HCT 42.8 04/30/2021   PLT 94 (L) 04/30/2021   GLUCOSE 87 04/30/2021   CHOL 207 (H) 05/06/2018   TRIG 173.0 (H) 05/06/2018   HDL 80.10 05/06/2018   LDLDIRECT 154.0 08/09/2015   LDLCALC 92 05/06/2018   ALT 203 (H) 04/30/2021   AST 487 (H) 04/30/2021   NA 129 (L) 04/30/2021   K 3.4 (L) 04/30/2021   CL 90 (L) 04/30/2021   CREATININE 0.64 04/30/2021   BUN <5 (L) 04/30/2021   CO2 24 04/30/2021   TSH 4.93 (H) 03/26/2019    Lab Results  Component Value Date   TSH 4.93 (H) 03/26/2019   Lab Results  Component Value Date   WBC 8.2 04/30/2021   HGB 14.8 04/30/2021   HCT 42.8 04/30/2021   MCV 95.3 04/30/2021   PLT 94 (L) 04/30/2021   Lab Results  Component Value Date   NA 129 (L) 04/30/2021   K 3.4 (L) 04/30/2021   CO2 24 04/30/2021   GLUCOSE 87 04/30/2021   BUN <5 (L) 04/30/2021   CREATININE 0.64 04/30/2021   BILITOT 1.2 04/30/2021   ALKPHOS 89 04/30/2021   AST 487 (H) 04/30/2021   ALT 203 (H) 04/30/2021   PROT 8.9 (H) 04/30/2021   ALBUMIN 5.0 04/30/2021   CALCIUM 8.9 04/30/2021   ANIONGAP 15 04/30/2021   GFR  74.81 03/26/2019   Lab Results  Component Value Date   CHOL 207 (H) 05/06/2018   Lab  Results  Component Value Date   HDL 80.10 05/06/2018   Lab Results  Component Value Date   LDLCALC 92 05/06/2018   Lab Results  Component Value Date   TRIG 173.0 (H) 05/06/2018   Lab Results  Component Value Date   CHOLHDL 3 05/06/2018   No results found for: HGBA1C     Assessment & Plan:   Problem List Items Addressed This Visit     Depression with anxiety    She agrees to stay on Lexapro 10 mg daily and reassess at next visit      Relevant Medications   escitalopram (LEXAPRO) 10 MG tablet   Abnormal liver function test    Encouraged to avoid all alcohol      Alcohol use (HCC)    Relapsed but has not had any alcohol for a week now. She declines referral, support groups or counseling but is encouraged to consider what her plan is if she starts to struggle again. She feels well today      Labile blood pressure    Good bp today        Meds ordered this encounter  Medications   DISCONTD: escitalopram (LEXAPRO) 10 MG tablet    Sig: Take 1 tablet (10 mg total) by mouth daily.    Dispense:  30 tablet    Refill:  3   escitalopram (LEXAPRO) 10 MG tablet    Sig: Take 1 tablet (10 mg total) by mouth daily.    Dispense:  30 tablet    Refill:  3    I,Cynthia Salazar,acting as a scribe for Danise Edge, MD.,have documented all relevant documentation on the behalf of Danise Edge, MD,as directed by  Danise Edge, MD while in the presence of Danise Edge, MD.   I, Bradd Canary, MD., personally preformed the services described in this documentation.  All medical record entries made by the scribe were at my direction and in my presence.  I have reviewed the chart and discharge instructions (if applicable) and agree that the record reflects my personal performance and is accurate and complete. 05/29/2021

## 2021-05-29 NOTE — Assessment & Plan Note (Signed)
Good bp today

## 2021-05-29 NOTE — Assessment & Plan Note (Signed)
Relapsed but has not had any alcohol for a week now. She declines referral, support groups or counseling but is encouraged to consider what her plan is if she starts to struggle again. She feels well today

## 2021-05-29 NOTE — Telephone Encounter (Addendum)
Pt has called back stating that she is would like her medication sent to [facility] since it was going to be over 27$ cheaper there. I have sent her rx to [facility] per pt request. Canceled rx at [facility].

## 2021-05-29 NOTE — Assessment & Plan Note (Signed)
She agrees to stay on Lexapro 10 mg daily and reassess at next visit

## 2021-05-29 NOTE — Assessment & Plan Note (Signed)
Encouraged to avoid all alcohol

## 2021-09-28 ENCOUNTER — Other Ambulatory Visit: Payer: Self-pay | Admitting: Medical

## 2021-10-03 ENCOUNTER — Ambulatory Visit: Payer: Self-pay | Admitting: Family Medicine

## 2021-10-24 ENCOUNTER — Telehealth (INDEPENDENT_AMBULATORY_CARE_PROVIDER_SITE_OTHER): Payer: Managed Care, Other (non HMO) | Admitting: Medical

## 2021-10-24 ENCOUNTER — Ambulatory Visit: Payer: Managed Care, Other (non HMO) | Admitting: Medical

## 2021-10-24 DIAGNOSIS — F418 Other specified anxiety disorders: Secondary | ICD-10-CM

## 2021-10-24 DIAGNOSIS — R0989 Other specified symptoms and signs involving the circulatory and respiratory systems: Secondary | ICD-10-CM | POA: Diagnosis not present

## 2021-10-24 DIAGNOSIS — J4520 Mild intermittent asthma, uncomplicated: Secondary | ICD-10-CM

## 2021-10-24 DIAGNOSIS — R748 Abnormal levels of other serum enzymes: Secondary | ICD-10-CM

## 2021-10-24 MED ORDER — ESCITALOPRAM OXALATE 20 MG PO TABS: 20.0000 mg | ORAL_TABLET | Freq: Every day | ORAL | 5 refills | Status: DC

## 2021-10-24 MED ORDER — ALBUTEROL SULFATE HFA 108 (90 BASE) MCG/ACT IN AERS: 2.0000 | INHALATION_SPRAY | Freq: Four times a day (QID) | RESPIRATORY_TRACT | 3 refills | Status: DC | PRN

## 2021-10-24 NOTE — Progress Notes (Signed)
° °  Subjective:    Patient ID: Cynthia Salazar, female    DOB: Jan 05, 1991, 31 y.o.   MRN: [id number]  HPI  Virtual Visit via Video Note  I connected with Cynthia Salazar on 10/24/21 at  4:20 PM EST by a video enabled telemedicine application and verified that I am speaking with the correct person using two identifiers.  Location: Patient: home Provider: office   I discussed the limitations of evaluation and management by telemedicine and the availability of in person appointments. The patient expressed understanding and agreed to proceed.  History of Present Illness: Pt has follow up for depression with anxiety. She had 10 mg of lexapro and about 6 weeks ago started on 20 mg a day. She states she feels great. Both depression and anxiety well controlled. She needs 20 mg dose refill.  Asthma- needs refill of albuterol. She admits every other day used 1-2 puffs albuterol. Notes correlation with admitted vaping.   Pt not using any alcohol for 65 days.   2 days ago bp 128/82     Observations/Objective: General-no acute distress, pleasant, oriented. Lungs- on inspection lungs appear unlabored. Neck- no tracheal deviation or jvd on inspection. Neuro- gross motor function appears intact.   Assessment and Plan:  Patient Instructions  Depression and anxiety well controlled with the Lexapro.  I refilled Lexapro 20 mg daily with 5 refills.  Intermittent mild asthma.  Seems to be made worse by vaping.  Refill your albuterol.  Encouraged to cut back on the vaping or stopping completely.   Elevated liver enzymes with alcohol abuse.  Very glad to hear that he has not drank any alcohol for 65 days.  Good job.  Labile blood pressure in the past.  2 days ago good blood pressure 128/82.  Follow-up in 6 months provided doing well or sooner if needed.  Mackie Pai, PA-C  Follow Up Instructions:    I discussed the assessment and treatment plan with the patient. The patient was provided an  opportunity to ask questions and all were answered. The patient agreed with the plan and demonstrated an understanding of the instructions.   The patient was advised to call back or seek an in-person evaluation if the symptoms worsen or if the condition fails to improve as anticipated.  Time spent with patient today was 15  minutes which consisted of chart revdiew, discussing diagnosis, work up treatment and documentation.    Mackie Pai, PA-C   Review of Systems  Constitutional:  Negative for chills, fatigue and fever.  Respiratory:  Positive for wheezing. Negative for cough, chest tightness and shortness of breath.        See hpi.  Cardiovascular:  Negative for chest pain and palpitations.  Genitourinary:  Negative for dysuria.  Musculoskeletal:  Negative for back pain.  Psychiatric/Behavioral:  Positive for dysphoric mood. Negative for agitation, self-injury and suicidal ideas. The patient is nervous/anxious.        Depression and anxiety controlled with lexapro.      Objective:   Physical Exam        Assessment & Plan:

## 2021-10-24 NOTE — Patient Instructions (Addendum)
Depression and anxiety well controlled with the Lexapro.  I refilled Lexapro 20 mg daily with 5 refills.  Intermittent mild asthma.  Seems to be made worse by vaping.  Refill your albuterol.  Encouraged to cut back on the vaping or stopping completely.   Elevated liver enzymes with alcohol abuse.  Very glad to hear that he has not drank any alcohol for 65 days.  Good job.  Labile blood pressure in the past.  2 days ago good blood pressure 128/82.  Follow-up in 6 months provided doing well or sooner if needed.

## 2021-11-07 ENCOUNTER — Encounter: Payer: Self-pay | Admitting: Family Medicine

## 2021-11-08 ENCOUNTER — Other Ambulatory Visit: Payer: Self-pay

## 2021-11-08 DIAGNOSIS — J4520 Mild intermittent asthma, uncomplicated: Secondary | ICD-10-CM

## 2021-11-08 MED ORDER — ALBUTEROL SULFATE HFA 108 (90 BASE) MCG/ACT IN AERS: 2.0000 | INHALATION_SPRAY | Freq: Four times a day (QID) | RESPIRATORY_TRACT | 3 refills | Status: DC | PRN

## 2021-11-15 ENCOUNTER — Emergency Department (HOSPITAL_COMMUNITY)
Admission: EM | Admit: 2021-11-15 | Discharge: 2021-11-15 | Disposition: A | Discharge status: Home / Self Care | Payer: Commercial Managed Care - HMO | Attending: Emergency Medicine | Admitting: Emergency Medicine

## 2021-11-15 ENCOUNTER — Other Ambulatory Visit: Payer: Self-pay

## 2021-11-15 DIAGNOSIS — J45909 Unspecified asthma, uncomplicated: Secondary | ICD-10-CM | POA: Insufficient documentation

## 2021-11-15 DIAGNOSIS — F10920 Alcohol use, unspecified with intoxication, uncomplicated: Secondary | ICD-10-CM | POA: Insufficient documentation

## 2021-11-15 DIAGNOSIS — R251 Tremor, unspecified: Secondary | ICD-10-CM | POA: Diagnosis present

## 2021-11-15 DIAGNOSIS — I10 Essential (primary) hypertension: Secondary | ICD-10-CM | POA: Insufficient documentation

## 2021-11-15 NOTE — ED Notes (Signed)
Patient resting quietly on stretcher with eyes closed.

## 2021-11-15 NOTE — ED Triage Notes (Signed)
Patient BIB EMS for evaluation of possible ETOH intoxication.  Was found at a bar.  They reported that she only had two shots "and has been there for hours."  Nonverbal on arrival.  Would not speak to anyone at the bar.  When bar was closing, EMS was called.  Pt is aware of surroundings.  No reports of drug use currently.  Tearful on arrival  ?

## 2021-11-15 NOTE — ED Notes (Signed)
Unable to obtain vital signs from patient during visit or at time of discharge.  Patient refused all care ?
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
## 2021-11-15 NOTE — ED Notes (Signed)
Patient left department prior to discharge.  Voiced to MD that she currently had no SI/HI.   ?

## 2021-11-15 NOTE — ED Notes (Signed)
Patient refusing to let staff obtain vital signs or assist patient in any way.  Will only state "yes" and "nope" to random questions.  Unable to tell RN the reason she is currently in ED.  Will stare and not answer multiple questions.  Attempting to turn off phone and unable to.  Patient does not answer SI/HI questions at this time ?

## 2021-11-15 NOTE — ED Notes (Signed)
Patient not found in room.  Left room after speaking to MD.  Left a pair of "2019 [employer] 4 Runner Keys" at bedside.  ?

## 2021-11-15 NOTE — ED Notes (Signed)
MD aware that patient left department prior to discharge teaching and instructions.  Patient was not seen by RN or staff leaving department.  Attempted to locate patient around and outside of department.  Unable to locate patient.  Patient was BIB EMS and left keys at bedside.  Patient did not have a vehicle to drive at time of discharge  ?

## 2021-11-18 ENCOUNTER — Other Ambulatory Visit: Payer: Self-pay

## 2021-11-18 ENCOUNTER — Encounter (HOSPITAL_COMMUNITY): Payer: Self-pay | Admitting: Emergency Medicine

## 2021-11-18 ENCOUNTER — Emergency Department (HOSPITAL_COMMUNITY)
Admission: EM | Admit: 2021-11-18 | Discharge: 2021-11-18 | Disposition: A | Discharge status: Home / Self Care | Payer: Commercial Managed Care - HMO | Attending: Emergency Medicine | Admitting: Emergency Medicine

## 2021-11-18 ENCOUNTER — Emergency Department (HOSPITAL_COMMUNITY): Payer: Commercial Managed Care - HMO

## 2021-11-18 DIAGNOSIS — Y908 Blood alcohol level of 240 mg/100 ml or more: Secondary | ICD-10-CM | POA: Diagnosis not present

## 2021-11-18 DIAGNOSIS — F1010 Alcohol abuse, uncomplicated: Secondary | ICD-10-CM | POA: Diagnosis present

## 2021-11-18 DIAGNOSIS — E860 Dehydration: Secondary | ICD-10-CM | POA: Diagnosis not present

## 2021-11-18 DIAGNOSIS — R42 Dizziness and giddiness: Secondary | ICD-10-CM | POA: Diagnosis not present

## 2021-11-18 DIAGNOSIS — Z79899 Other long term (current) drug therapy: Secondary | ICD-10-CM | POA: Insufficient documentation

## 2021-11-18 LAB — RAPID URINE DRUG SCREEN, HOSP PERFORMED
Amphetamines: NOT DETECTED
Barbiturates: NOT DETECTED
Benzodiazepines: POSITIVE — AB
Cocaine: NOT DETECTED
Opiates: NOT DETECTED
Tetrahydrocannabinol: POSITIVE — AB

## 2021-11-18 LAB — CBC WITH DIFFERENTIAL/PLATELET
Abs Immature Granulocytes: 0.02 10*3/uL (ref 0.00–0.07)
Basophils Absolute: 0.1 10*3/uL (ref 0.0–0.1)
Basophils Relative: 1 %
Eosinophils Absolute: 0.1 10*3/uL (ref 0.0–0.5)
Eosinophils Relative: 1 %
HCT: 42.6 % (ref 36.0–46.0)
Hemoglobin: 14.4 g/dL (ref 12.0–15.0)
Immature Granulocytes: 0 %
Lymphocytes Relative: 26 %
Lymphs Abs: 1.7 10*3/uL (ref 0.7–4.0)
MCH: 30.6 pg (ref 26.0–34.0)
MCHC: 33.8 g/dL (ref 30.0–36.0)
MCV: 90.6 fL (ref 80.0–100.0)
Monocytes Absolute: 0.4 10*3/uL (ref 0.1–1.0)
Monocytes Relative: 6 %
Neutro Abs: 4.4 10*3/uL (ref 1.7–7.7)
Neutrophils Relative %: 66 %
Platelets: 223 10*3/uL (ref 150–400)
RBC: 4.7 MIL/uL (ref 3.87–5.11)
RDW: 12.4 % (ref 11.5–15.5)
WBC: 6.6 10*3/uL (ref 4.0–10.5)
nRBC: 0 % (ref 0.0–0.2)

## 2021-11-18 LAB — ETHANOL: Alcohol, Ethyl (B): 379 mg/dL (ref <10)

## 2021-11-18 LAB — COMPREHENSIVE METABOLIC PANEL
ALT: 27 U/L (ref 0–44)
AST: 42 U/L — ABNORMAL HIGH (ref 15–41)
Albumin: 3.8 g/dL (ref 3.5–5.0)
Alkaline Phosphatase: 48 U/L (ref 38–126)
Anion gap: 11 (ref 5–15)
BUN: 9 mg/dL (ref 6–20)
CO2: 26 mmol/L (ref 22–32)
Calcium: 8 mg/dL — ABNORMAL LOW (ref 8.9–10.3)
Chloride: 106 mmol/L (ref 98–111)
Creatinine, Ser: 0.77 mg/dL (ref 0.44–1.00)
GFR, Estimated: >60 mL/min (ref >60)
Glucose, Bld: 94 mg/dL (ref 70–99)
Potassium: 3.8 mmol/L (ref 3.5–5.1)
Sodium: 143 mmol/L (ref 135–145)
Total Bilirubin: 0.4 mg/dL (ref 0.3–1.2)
Total Protein: 7 g/dL (ref 6.5–8.1)

## 2021-11-18 LAB — AMMONIA: Ammonia: 39 umol/L — ABNORMAL HIGH (ref 9–35)

## 2021-11-18 LAB — HCG, QUANTITATIVE, PREGNANCY: hCG, Beta Chain, Quant, S: <1 m[IU]/mL (ref <5)

## 2021-11-18 MED ORDER — SODIUM CHLORIDE 0.9 % IV BOLUS
1000.0000 mL | Freq: Once | INTRAVENOUS | Status: AC
  Administered 2021-11-18: 1000 mL via INTRAVENOUS

## 2021-11-18 NOTE — Discharge Instructions (Signed)
Follow up with [facility] or your family doctor next week to discuss your alcohol abuse ?

## 2021-11-18 NOTE — ED Provider Notes (Signed)
?[facility] EMERGENCY DEPARTMENT ?Provider Note ? ? ?CSN: [id number] ?Arrival date & time: 11/18/21  1826 ? ?  ? ?History ? ?Chief Complaint  ?Patient presents with  ? Alcohol Problem  ? ? ?Cynthia Salazar is a 31 y.o. female. ? ?Patient was brought in by family because of alcohol abuse.  No other past medical history ? ?The history is provided by the patient and a relative. No language interpreter was used.  ?Alcohol Problem ?This is a recurrent problem. The current episode started more than 2 days ago. The problem occurs hourly. The problem has Cynthia changed since onset.Pertinent negatives include no chest pain, no abdominal pain and no headaches. Nothing aggravates the symptoms. Nothing relieves the symptoms. She has tried nothing for the symptoms. The treatment provided no relief.  ? ?  ? ?Home Medications ?Prior to Admission medications   ?Medication Sig Start Date End Date Taking? Authorizing Provider  ?albuterol (VENTOLIN HFA) 108 (90 Base) MCG/ACT inhaler Inhale 2 puffs into the lungs every 6 (six) hours as needed. 11/08/21   Bradd Canary, MD  ?escitalopram (LEXAPRO) 20 MG tablet Take 1 tablet (20 mg total) by mouth daily. 10/24/21   Saguier, Ramon Dredge, PA-C  ?   ? ?Allergies    ?Aleve [naproxen sodium]   ? ?Review of Systems   ?Review of Systems  ?Constitutional:  Negative for appetite change and fatigue.  ?HENT:  Negative for congestion, ear discharge and sinus pressure.   ?Eyes:  Negative for discharge.  ?Respiratory:  Negative for cough.   ?Cardiovascular:  Negative for chest pain.  ?Gastrointestinal:  Negative for abdominal pain and diarrhea.  ?Genitourinary:  Negative for frequency and hematuria.  ?Musculoskeletal:  Negative for back pain.  ?Skin:  Negative for rash.  ?Neurological:  Negative for seizures and headaches.  ?Psychiatric/Behavioral:  Negative for hallucinations.   ? ?Physical Exam ?Updated Vital Signs ?BP (!) 134/104   Pulse 94   Temp 98 ?F (36.7 ?C) (Oral)   Resp 15   Ht 5\' 1"  (1.549 m)    SpO2 91%   BMI 29.66 kg/m?  ?Physical Exam ?Constitutional:   ?   Appearance: She is well-developed.  ?HENT:  ?   Head: Normocephalic.  ?   Nose: Nose normal.  ?Eyes:  ?   General: No scleral icterus. ?   Conjunctiva/sclera: Conjunctivae normal.  ?Neck:  ?   Thyroid: No thyromegaly.  ?Cardiovascular:  ?   Rate and Rhythm: Normal rate and regular rhythm.  ?   Heart sounds: No murmur heard. ?  No friction rub. No gallop.  ?Pulmonary:  ?   Breath sounds: No stridor. No wheezing or rales.  ?Chest:  ?   Chest wall: No tenderness.  ?Abdominal:  ?   General: There is no distension.  ?   Tenderness: There is no abdominal tenderness. There is no rebound.  ?Musculoskeletal:     ?   General: Normal range of motion.  ?   Cervical back: Neck supple.  ?Lymphadenopathy:  ?   Cervical: No cervical adenopathy.  ?Skin: ?   Findings: No erythema or rash.  ?Neurological:  ?   Mental Status: She is alert and oriented to person, place, and time.  ?   Motor: No abnormal muscle tone.  ?   Coordination: Coordination normal.  ?Psychiatric:     ?   Behavior: Behavior normal.  ? ? ?ED Results / Procedures / Treatments   ?Labs ?(all labs ordered are listed, but only abnormal results are  displayed) ?Labs Reviewed  ?COMPREHENSIVE METABOLIC PANEL - Abnormal; Notable for the following components:  ?    Result Value  ? Calcium 8.0 (*)   ? AST 42 (*)   ? All other components within normal limits  ?ETHANOL - Abnormal; Notable for the following components:  ? Alcohol, Ethyl (B) 379 (*)   ? All other components within normal limits  ?AMMONIA - Abnormal; Notable for the following components:  ? Ammonia 39 (*)   ? All other components within normal limits  ?CBC WITH DIFFERENTIAL/PLATELET  ?HCG, QUANTITATIVE, PREGNANCY  ?RAPID URINE DRUG SCREEN, HOSP PERFORMED  ? ? ?EKG ?None ? ?Radiology ?CT Head Wo Contrast ? ?Result Date: 11/18/2021 ?CLINICAL DATA:  Dizziness, persistent/recurrent, cardiac or vascular cause suspected EXAM: CT HEAD WITHOUT CONTRAST  TECHNIQUE: Contiguous axial images were obtained from the base of the skull through the vertex without intravenous contrast. RADIATION DOSE REDUCTION: This exam was performed according to the departmental dose-optimization program which includes automated exposure control, adjustment of the mA and/or kV according to patient size and/or use of iterative reconstruction technique. COMPARISON:  None. FINDINGS: Brain: There is mild but age advanced atrophy. No intracranial hemorrhage, mass effect, or midline shift. No hydrocephalus. Incidental cavum septum pellucidum. The basilar cisterns are patent. No evidence of territorial infarct or acute ischemia. No extra-axial or intracranial fluid collection. Vascular: No hyperdense vessel or unexpected calcification. Skull: No fracture or focal lesion. Sinuses/Orbits: Paranasal sinuses and mastoid air cells are clear. The visualized orbits are unremarkable. Other: None. IMPRESSION: 1. No acute intracranial abnormality. 2. Mild but age advanced atrophy. Electronically Signed   By: Keith Rake M.D.   On: 11/18/2021 20:58   ? ?Procedures ?Procedures  ? ? ?Medications Ordered in ED ?Medications  ?sodium chloride 0.9 % bolus 1,000 mL (0 mLs Intravenous Stopped 11/18/21 2315)  ? ? ?ED Course/ Medical Decision Making/ A&P ?  ?                        ?Medical Decision Making ?Amount and/or Complexity of Data Reviewed ?Labs: ordered. ?Radiology: ordered. ? ?This patient presents to the ED for concern of alcohol abuse, this involves an extensive number of treatment options, and is a complaint that carries with it a high risk of complications and morbidity.  The differential diagnosis includes alcohol abuse ? ? ?Co morbidities that complicate the patient evaluation ? ?Alcohol use ? ? ?Additional history obtained: ? ?Additional history obtained from relative ?External records from outside source obtained and reviewed including hospital record ? ? ?Lab Tests: ? ?I Ordered, and  personally interpreted labs.  The pertinent results include: CBC and chemistries and EtOH.  Alcohol level 379 ? ? ?Imaging Studies ordered: ? ?CT head negative ? ?Cardiac Monitoring: / EKG: ? ?The patient was maintained on a cardiac monitor.  I personally viewed and interpreted the cardiac monitored which showed an underlying rhythm of: Normal sinus rhythm ? ? ?Consultations Obtained: ? ?No consultant ?Problem List / ED Course / Critical interventions / Medication management ? ?EtOH abuse ?I ordered medication including normal saline for dehydration ?Reevaluation of the patient after these medicines showed that the patient improved ?I have reviewed the patients home medicines and have made adjustments as needed ? ? ?Social Determinants of Health: ? ?Alcohol abuse ? ? ?Test / Admission - Considered: ? ?No other test considered ? ?Patient with alcohol abuse.  She is Cynthia suicidal or homicidal and Cynthia having hallucinations.  She is able  to ambulate without problems she will be discharged home and follow-up as an outpatient ? ? ? ? ? ? ? ?Final Clinical Impression(s) / ED Diagnoses ?Final diagnoses:  ?ETOH abuse  ? ? ?Rx / DC Orders ?ED Discharge Orders   ? ? None  ? ?  ? ? ?  ?Milton Ferguson, MD ?11/20/21 1436 ? ?
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
## 2021-11-18 NOTE — ED Notes (Signed)
Pt approached nurse's desk and when asked if she needed anything pt did not answer any questions. Pt has a blank like stare on her face. Pt was redirected back to her room by day time RN and pt's mother arrived shortly after and is now at bedside with her.  ?
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
## 2021-11-20 ENCOUNTER — Encounter: Payer: Self-pay | Admitting: Family Medicine

## 2021-11-26 ENCOUNTER — Other Ambulatory Visit: Payer: Self-pay

## 2021-11-26 ENCOUNTER — Emergency Department (HOSPITAL_COMMUNITY)
Admission: EM | Admit: 2021-11-26 | Discharge: 2021-11-27 | Discharge status: Against Medical Advice | Payer: Managed Care, Other (non HMO) | Attending: Emergency Medicine | Admitting: Emergency Medicine

## 2021-11-26 DIAGNOSIS — F10929 Alcohol use, unspecified with intoxication, unspecified: Secondary | ICD-10-CM | POA: Insufficient documentation

## 2021-11-26 DIAGNOSIS — Z5321 Procedure and treatment not carried out due to patient leaving prior to being seen by health care provider: Secondary | ICD-10-CM | POA: Insufficient documentation

## 2021-11-26 NOTE — ED Triage Notes (Signed)
Pt BIB EMS from [facility] where she found sitting outside her room without pants.  Pt appears intoxicated.  Does admit to drinking.  Denies any ingestion otherwise but an empty bottle of Lexapro was on scene.  Cannot answer orientation questions but is alert to self.  Unable to stand or ambulate.  20G LFA  200 NS given en route.  ?

## 2022-02-11 ENCOUNTER — Encounter: Payer: Self-pay | Admitting: Medical

## 2022-03-19 ENCOUNTER — Encounter: Payer: Self-pay | Admitting: Family Medicine

## 2022-03-19 ENCOUNTER — Telehealth (INDEPENDENT_AMBULATORY_CARE_PROVIDER_SITE_OTHER): Payer: Commercial Managed Care - HMO | Admitting: Family

## 2022-03-19 DIAGNOSIS — A084 Viral intestinal infection, unspecified: Secondary | ICD-10-CM

## 2022-03-19 MED ORDER — ONDANSETRON 4 MG PO TBDP: 4.0000 mg | ORAL_TABLET | Freq: Three times a day (TID) | ORAL | 0 refills | Status: DC | PRN

## 2022-03-19 NOTE — Assessment & Plan Note (Signed)
New. Diarrhea has resolved but nausea/vomitting persist. Recommended Zofran odt as needed. Small frequent sips of gatorade/popsicles/water.  If symptoms worsen or if not able to keep liquids down despite zofran, recommended that she proceed to the ED. Pt verbalizes understanding.

## 2022-03-19 NOTE — Progress Notes (Signed)
MyChart Video Visit    Virtual Visit via Video Note   This visit type was conducted due to national recommendations for restrictions regarding the COVID-19 Pandemic (e.g. social distancing) in an effort to limit this patient's exposure and mitigate transmission in our community. This patient is at least at moderate risk for complications without adequate follow up. This format is felt to be most appropriate for this patient at this time. Physical exam was limited by quality of the video and audio technology used for the visit. CMA was able to get the patient set up on a video visit.  Patient location: Home. Patient and provider in visit Provider location: Office  I discussed the limitations of evaluation and management by telemedicine and the availability of in person appointments. The patient expressed understanding and agreed to proceed.  Visit Date: 03/19/2022  Today's healthcare provider: Lemont Fillers, NP     Subjective:    Patient ID: Cynthia Salazar, female    DOB: 1990-09-06, 31 y.o.   MRN: [id number]  Chief Complaint  Patient presents with   Vomiting    Complains of vomiting for the past 3 days.    Diarrhea    Reports having diarrhea 3 days ago    HPI  Pt is a 31 yr old female who presents today with c/o Diarrhea Friday 7/14 and Saturday 7/15.  Saturday 7/15 overnight her vomiting started.  She vomited at 3 am and 8 am.  Denies known sick contacts.  She is drinking lots of water but is throwing a lot of it up.   Last BM was this AM and was formed.   Past Medical History:  Diagnosis Date   Abnormal liver function test 03/18/2013   Alcohol use 06/03/2015   Allergy    Asthma    Asthma 05/29/2012   Asthma 05/29/2012   Chicken pox as a child   Depression 31 yrs old   Depression with anxiety    Essential hypertension 06/03/2015   Insomnia 03/17/2013   Other and unspecified hyperlipidemia 03/18/2013   Overweight 04/05/2014   Preventative health care 05/28/2012     Past Surgical History:  Procedure Laterality Date   WISDOM TOOTH EXTRACTION  19 yrs    Family History  Problem Relation Age of Onset   Nephrolithiasis Father    Hypertension Father    Nephrolithiasis Brother    COPD Paternal Grandmother     Social History   Socioeconomic History   Marital status: Single    Spouse name: Not on file   Number of children: Not on file   Years of education: Not on file   Highest education level: Not on file  Occupational History   Not on file  Tobacco Use   Smoking status: Former   Smokeless tobacco: Never   Tobacco comments:    occasionally smoked one  Vaping Use   Vaping Use: Never used  Substance and Sexual Activity   Alcohol use: Yes    Comment: 1 pint per day    Drug use: No   Sexual activity: Yes    Partners: Female  Other Topics Concern   Not on file  Social History Narrative   Not on file   Social Determinants of Health   Financial Resource Strain: Not on file  Food Insecurity: Not on file  Transportation Needs: Not on file  Physical Activity: Not on file  Stress: Not on file  Social Connections: Not on file  Intimate Partner Violence: Not on  file    Outpatient Medications Prior to Visit  Medication Sig Dispense Refill   albuterol (VENTOLIN HFA) 108 (90 Base) MCG/ACT inhaler Inhale 2 puffs into the lungs every 6 (six) hours as needed. 18 g 3   escitalopram (LEXAPRO) 20 MG tablet Take 1 tablet (20 mg total) by mouth daily. (Patient not taking: Reported on 03/19/2022) 30 tablet 5   No facility-administered medications prior to visit.    Allergies  Allergen Reactions   Aleve [Naproxen Sodium]     wheeze    ROS See HPI    Objective:    Physical Exam  There were no vitals taken for this visit. Wt Readings from Last 3 Encounters:  11/26/21 160 lb (72.6 kg)  05/29/21 157 lb (71.2 kg)  04/30/21 149 lb 14.6 oz (68 kg)    Gen: Awake, alert, no acute distress Resp: Breathing is even and non-labored Psych:  calm/pleasant demeanor Neuro: Alert and Oriented x 3, + facial symmetry, speech is clear.     Assessment & Plan:   Problem List Items Addressed This Visit       Unprioritized   Viral gastroenteritis - Primary    New. Diarrhea has resolved but nausea/vomitting persist. Recommended Zofran odt as needed. Small frequent sips of gatorade/popsicles/water.  If symptoms worsen or if not able to keep liquids down despite zofran, recommended that she proceed to the ED. Pt verbalizes understanding.        I am having Cynthia Salazar start on ondansetron. I am also having her maintain her escitalopram and albuterol.  Meds ordered this encounter  Medications   ondansetron (ZOFRAN-ODT) 4 MG disintegrating tablet    Sig: Take 1 tablet (4 mg total) by mouth every 8 (eight) hours as needed for nausea or vomiting.    Dispense:  20 tablet    Refill:  0    Order Specific Question:   Supervising Provider    Answer:   Danise Edge A [[id number]]    I discussed the assessment and treatment plan with the patient. The patient was provided an opportunity to ask questions and all were answered. The patient agreed with the plan and demonstrated an understanding of the instructions.   The patient was advised to call back or seek an in-person evaluation if the symptoms worsen or if the condition fails to improve as anticipated.  Lemont Fillers, NP [employer] [facility] [employer] [phone] (phone) [phone] (fax)  [facility]

## 2022-04-08 ENCOUNTER — Emergency Department (HOSPITAL_BASED_OUTPATIENT_CLINIC_OR_DEPARTMENT_OTHER)
Admission: EM | Admit: 2022-04-08 | Discharge: 2022-04-08 | Disposition: A | Discharge status: Home / Self Care | Payer: Commercial Managed Care - HMO | Attending: Emergency Medicine | Admitting: Emergency Medicine

## 2022-04-08 ENCOUNTER — Encounter (HOSPITAL_BASED_OUTPATIENT_CLINIC_OR_DEPARTMENT_OTHER): Payer: Self-pay | Admitting: *Deleted

## 2022-04-08 ENCOUNTER — Other Ambulatory Visit: Payer: Self-pay

## 2022-04-08 DIAGNOSIS — J45909 Unspecified asthma, uncomplicated: Secondary | ICD-10-CM | POA: Insufficient documentation

## 2022-04-08 DIAGNOSIS — F10221 Alcohol dependence with intoxication delirium: Secondary | ICD-10-CM | POA: Insufficient documentation

## 2022-04-08 DIAGNOSIS — R1011 Right upper quadrant pain: Secondary | ICD-10-CM | POA: Insufficient documentation

## 2022-04-08 DIAGNOSIS — Y908 Blood alcohol level of 240 mg/100 ml or more: Secondary | ICD-10-CM | POA: Insufficient documentation

## 2022-04-08 DIAGNOSIS — Z87891 Personal history of nicotine dependence: Secondary | ICD-10-CM | POA: Diagnosis not present

## 2022-04-08 DIAGNOSIS — Z7951 Long term (current) use of inhaled steroids: Secondary | ICD-10-CM | POA: Insufficient documentation

## 2022-04-08 DIAGNOSIS — F1210 Cannabis abuse, uncomplicated: Secondary | ICD-10-CM | POA: Insufficient documentation

## 2022-04-08 DIAGNOSIS — I10 Essential (primary) hypertension: Secondary | ICD-10-CM | POA: Diagnosis not present

## 2022-04-08 LAB — COMPREHENSIVE METABOLIC PANEL
ALT: 181 U/L — ABNORMAL HIGH (ref 0–44)
AST: 261 U/L — ABNORMAL HIGH (ref 15–41)
Albumin: 4.9 g/dL (ref 3.5–5.0)
Alkaline Phosphatase: 92 U/L (ref 38–126)
Anion gap: 19 — ABNORMAL HIGH (ref 5–15)
BUN: <5 mg/dL — ABNORMAL LOW (ref 6–20)
CO2: 24 mmol/L (ref 22–32)
Calcium: 8.9 mg/dL (ref 8.9–10.3)
Chloride: 90 mmol/L — ABNORMAL LOW (ref 98–111)
Creatinine, Ser: 0.58 mg/dL (ref 0.44–1.00)
GFR, Estimated: >60 mL/min (ref >60)
Glucose, Bld: 111 mg/dL — ABNORMAL HIGH (ref 70–99)
Potassium: 3.8 mmol/L (ref 3.5–5.1)
Sodium: 133 mmol/L — ABNORMAL LOW (ref 135–145)
Total Bilirubin: 1 mg/dL (ref 0.3–1.2)
Total Protein: 8.9 g/dL — ABNORMAL HIGH (ref 6.5–8.1)

## 2022-04-08 LAB — CBC WITH DIFFERENTIAL/PLATELET
Abs Immature Granulocytes: 0.01 10*3/uL (ref 0.00–0.07)
Basophils Absolute: 0 10*3/uL (ref 0.0–0.1)
Basophils Relative: 1 %
Eosinophils Absolute: 0 10*3/uL (ref 0.0–0.5)
Eosinophils Relative: 0 %
HCT: 42.4 % (ref 36.0–46.0)
Hemoglobin: 14.9 g/dL (ref 12.0–15.0)
Immature Granulocytes: 0 %
Lymphocytes Relative: 11 %
Lymphs Abs: 0.7 10*3/uL (ref 0.7–4.0)
MCH: 32.5 pg (ref 26.0–34.0)
MCHC: 35.1 g/dL (ref 30.0–36.0)
MCV: 92.6 fL (ref 80.0–100.0)
Monocytes Absolute: 0.5 10*3/uL (ref 0.1–1.0)
Monocytes Relative: 8 %
Neutro Abs: 4.8 10*3/uL (ref 1.7–7.7)
Neutrophils Relative %: 80 %
Platelets: 156 10*3/uL (ref 150–400)
RBC: 4.58 MIL/uL (ref 3.87–5.11)
RDW: 13.8 % (ref 11.5–15.5)
WBC: 5.9 10*3/uL (ref 4.0–10.5)
nRBC: 0 % (ref 0.0–0.2)

## 2022-04-08 LAB — PREGNANCY, URINE: Preg Test, Ur: NEGATIVE

## 2022-04-08 LAB — RAPID URINE DRUG SCREEN, HOSP PERFORMED
Amphetamines: NOT DETECTED
Barbiturates: NOT DETECTED
Benzodiazepines: NOT DETECTED
Cocaine: NOT DETECTED
Opiates: NOT DETECTED
Tetrahydrocannabinol: POSITIVE — AB

## 2022-04-08 LAB — ETHANOL: Alcohol, Ethyl (B): 436 mg/dL (ref <10)

## 2022-04-08 MED ORDER — LORAZEPAM 2 MG/ML IJ SOLN: 0.0000 mg | Freq: Four times a day (QID) | INTRAMUSCULAR | Status: DC

## 2022-04-08 MED ORDER — THIAMINE HCL 100 MG/ML IJ SOLN
100.0000 mg | Freq: Once | INTRAMUSCULAR | Status: AC
  Administered 2022-04-08: 100 mg via INTRAVENOUS
  Filled 2022-04-08: qty 2

## 2022-04-08 MED ORDER — SODIUM CHLORIDE 0.9 % IV BOLUS
1000.0000 mL | Freq: Once | INTRAVENOUS | Status: AC
  Administered 2022-04-08: 1000 mL via INTRAVENOUS

## 2022-04-08 MED ORDER — LORAZEPAM 1 MG PO TABS: 0.0000 mg | ORAL_TABLET | Freq: Four times a day (QID) | ORAL | Status: DC

## 2022-04-08 NOTE — ED Notes (Signed)
Pt verbalizes understanding of discharge instructions. Opportunity for questioning and answers were provided. Pt discharged from ED to home with mother.   ? ?

## 2022-04-08 NOTE — ED Notes (Signed)
Dr. Read Drivers aware of etoh of 436.

## 2022-04-08 NOTE — ED Provider Notes (Signed)
[facility] Provider Note: Lowella Dell, MD, FACEP  CSN: [id number] MRN: [id number] ARRIVAL: 04/08/22 at 0255 ROOM: DB016/DB016   CHIEF COMPLAINT  Alcohol Problem   HISTORY OF PRESENT ILLNESS  04/08/22 3:36 AM Cynthia Salazar is a 31 y.o. female with a history of alcohol abuse.  She states she last drank alcohol yesterday afternoon about 3 PM (a pint of vodka).  She typically drinks a pint a day.  Her mother became concerned about her because she had an ataxic gait and was afraid she would "have a seizure".  Her mother gave her 2, 0.5 mg of Xanax.  She has had right upper quadrant pain for the past several days which she rates as a 3 out of 10.  She is having a mild tremor of her upper extremities.   Past Medical History:  Diagnosis Date   Abnormal liver function test 03/18/2013   Alcohol use 06/03/2015   Allergy    Asthma    Chicken pox as a child   Depression with anxiety    Essential hypertension 06/03/2015   Insomnia 03/17/2013   Other and unspecified hyperlipidemia 03/18/2013    Past Surgical History:  Procedure Laterality Date   WISDOM TOOTH EXTRACTION  19 yrs    Family History  Problem Relation Age of Onset   Nephrolithiasis Father    Hypertension Father    Nephrolithiasis Brother    COPD Paternal Grandmother     Social History   Tobacco Use   Smoking status: Former   Smokeless tobacco: Never   Tobacco comments:    occasionally smoked one  Vaping Use   Vaping Use: Never used  Substance Use Topics   Alcohol use: Yes    Comment: 1 pint per day    Drug use: Yes    Types: Marijuana    Prior to Admission medications   Medication Sig Start Date End Date Taking? Authorizing Provider  albuterol (VENTOLIN HFA) 108 (90 Base) MCG/ACT inhaler Inhale 2 puffs into the lungs every 6 (six) hours as needed. 11/08/21   Bradd Canary, MD    Allergies Aleve [naproxen sodium]   REVIEW OF SYSTEMS  Negative except as noted here or in the History of  Present Illness.   PHYSICAL EXAMINATION  Initial Vital Signs Blood pressure (!) 145/113, pulse (!) 122, temperature 98.4 F (36.9 C), temperature source Oral, resp. rate 18, height 5\' 1"  (1.549 m), weight 70.3 kg, last menstrual period 03/26/2022, SpO2 95 %.  Examination General: Well-developed, well-nourished female in no acute distress; appearance consistent with age of record HENT: normocephalic; atraumatic Eyes: pupils equal, round and reactive to light; extraocular muscles intact Neck: supple Heart: regular rate and rhythm; tachycardia Lungs: clear to auscultation bilaterally Abdomen: soft; nondistended; nontender; mild hepatomegaly; bowel sounds present Extremities: No deformity; full range of motion; pulses normal Neurologic: Awake, alert; dysarthria; ataxia; motor function intact in all extremities and symmetric; no facial droop Skin: Warm and dry Psychiatric: Appears intoxicated   RESULTS  Summary of this visit's results, reviewed and interpreted by myself:   EKG Interpretation  Date/Time:    Ventricular Rate:    PR Interval:    QRS Duration:   QT Interval:    QTC Calculation:   R Axis:     Text Interpretation:         Laboratory Studies: Results for orders placed or performed during the hospital encounter of 04/08/22 (from the past 24 hour(s))  Rapid urine drug screen (hospital performed)  Status: Abnormal   Collection Time: 04/08/22  3:36 AM  Result Value Ref Range   Opiates NONE DETECTED NONE DETECTED   Cocaine NONE DETECTED NONE DETECTED   Benzodiazepines NONE DETECTED NONE DETECTED   Amphetamines NONE DETECTED NONE DETECTED   Tetrahydrocannabinol POSITIVE (A) NONE DETECTED   Barbiturates NONE DETECTED NONE DETECTED  Ethanol     Status: Abnormal   Collection Time: 04/08/22  3:53 AM  Result Value Ref Range   Alcohol, Ethyl (B) 436 (HH) <10 mg/dL  Comprehensive metabolic panel     Status: Abnormal   Collection Time: 04/08/22  3:53 AM  Result Value  Ref Range   Sodium 133 (L) 135 - 145 mmol/L   Potassium 3.8 3.5 - 5.1 mmol/L   Chloride 90 (L) 98 - 111 mmol/L   CO2 24 22 - 32 mmol/L   Glucose, Bld 111 (H) 70 - 99 mg/dL   BUN <5 (L) 6 - 20 mg/dL   Creatinine, Ser 4.69 0.44 - 1.00 mg/dL   Calcium 8.9 8.9 - 62.9 mg/dL   Total Protein 8.9 (H) 6.5 - 8.1 g/dL   Albumin 4.9 3.5 - 5.0 g/dL   AST 528 (H) 15 - 41 U/L   ALT 181 (H) 0 - 44 U/L   Alkaline Phosphatase 92 38 - 126 U/L   Total Bilirubin 1.0 0.3 - 1.2 mg/dL   GFR, Estimated >41 >32 mL/min   Anion gap 19 (H) 5 - 15  CBC with Differential     Status: None   Collection Time: 04/08/22  3:53 AM  Result Value Ref Range   WBC 5.9 4.0 - 10.5 K/uL   RBC 4.58 3.87 - 5.11 MIL/uL   Hemoglobin 14.9 12.0 - 15.0 g/dL   HCT 44.0 10.2 - 72.5 %   MCV 92.6 80.0 - 100.0 fL   MCH 32.5 26.0 - 34.0 pg   MCHC 35.1 30.0 - 36.0 g/dL   RDW 36.6 44.0 - 34.7 %   Platelets 156 150 - 400 K/uL   nRBC 0.0 0.0 - 0.2 %   Neutrophils Relative % 80 %   Neutro Abs 4.8 1.7 - 7.7 K/uL   Lymphocytes Relative 11 %   Lymphs Abs 0.7 0.7 - 4.0 K/uL   Monocytes Relative 8 %   Monocytes Absolute 0.5 0.1 - 1.0 K/uL   Eosinophils Relative 0 %   Eosinophils Absolute 0.0 0.0 - 0.5 K/uL   Basophils Relative 1 %   Basophils Absolute 0.0 0.0 - 0.1 K/uL   Immature Granulocytes 0 %   Abs Immature Granulocytes 0.01 0.00 - 0.07 K/uL  Pregnancy, urine     Status: None   Collection Time: 04/08/22  3:53 AM  Result Value Ref Range   Preg Test, Ur NEGATIVE NEGATIVE   Imaging Studies: No results found.  ED COURSE and MDM  Nursing notes, initial and subsequent vitals signs, including pulse oximetry, reviewed and interpreted by myself.  Vitals:   04/08/22 0325 04/08/22 0405 04/08/22 0415 04/08/22 0430  BP: (!) 145/113 (!) 130/104 (!) 132/96 (!) 140/100  Pulse: (!) 122 (!) 129 (!) 115 (!) 113  Resp: 18  18 18   Temp: 98.4 F (36.9 C)     TempSrc: Oral     SpO2: 95%  96% 97%  Weight: 70.3 kg     Height: 5\' 1"  (1.549  m)      Medications  LORazepam (ATIVAN) injection 0-4 mg ( Intravenous Not Given 04/08/22 0400)    Or  LORazepam (  ATIVAN) tablet 0-4 mg ( Oral See Alternative 04/08/22 0400)  sodium chloride 0.9 % bolus 1,000 mL (1,000 mLs Intravenous New Bag/Given 04/08/22 0405)  thiamine (VITAMIN B1) injection 100 mg (100 mg Intravenous Given 04/08/22 0417)   3:47 AM CIWA Ativan protocol initiated.  4:30 AM Blood alcohol is 0.436.  The patient appears clinically intoxicated.  She appears to have a mild tremor but it is unclear if this is affected or if it truly represents withdrawal.  I find it difficult to believe the patient would be having significant withdrawal symptoms with an alcohol this high.  This is also significantly higher than I would expect from a pint of vodka consumed over 12 hours ago.  I suspect she has had additional alcohol since that time.  5:57 AM The patient and her mother were advised of the patient's blood alcohol level.  Her presentation is consistent with alcohol intoxication and not withdrawal.  We will provide referrals for detox facilities.  PROCEDURES  Procedures   ED DIAGNOSES     ICD-10-CM   1. Alcohol intoxication in active alcoholic with delirium (HCC)  Y17.494     2. Cannabis abuse  F12.10          Ivee Poellnitz, Jonny Ruiz, MD 04/08/22 [phone]

## 2022-04-08 NOTE — ED Notes (Signed)
Pt assisted up to the bathroom by her mom and myself. Pt with an unsteady gait. Tolerated well. Instructed not to get out of bed without assist.

## 2022-04-08 NOTE — ED Triage Notes (Addendum)
Pt presents with "etoh withdrawal" states she last drank etoh Sat at 3pm-a pint of vodka. Pt drinks a pint daily. Mom states she was concerned because she was not able to walk straight and also concerned that she was going to have "a seizure" based on her behaviors that she gave her 2 0.5mg  of her Xanax. Pt c/o right upper quad pain for the past couple days. Pt has issues in the past with etoh concerns. Pt is not able to sit still in the bed. Mom at bedside.

## 2022-04-09 ENCOUNTER — Encounter: Payer: Self-pay | Admitting: Family Medicine

## 2022-06-16 ENCOUNTER — Encounter (HOSPITAL_BASED_OUTPATIENT_CLINIC_OR_DEPARTMENT_OTHER): Payer: Self-pay

## 2022-06-16 ENCOUNTER — Emergency Department (HOSPITAL_BASED_OUTPATIENT_CLINIC_OR_DEPARTMENT_OTHER)
Admission: EM | Admit: 2022-06-16 | Discharge: 2022-06-16 | Disposition: A | Discharge status: Home / Self Care | Payer: Commercial Managed Care - HMO | Source: Home / Self Care | Attending: Emergency Medicine | Admitting: Emergency Medicine

## 2022-06-16 DIAGNOSIS — J45909 Unspecified asthma, uncomplicated: Secondary | ICD-10-CM | POA: Insufficient documentation

## 2022-06-16 DIAGNOSIS — R112 Nausea with vomiting, unspecified: Secondary | ICD-10-CM | POA: Insufficient documentation

## 2022-06-16 DIAGNOSIS — F10920 Alcohol use, unspecified with intoxication, uncomplicated: Secondary | ICD-10-CM

## 2022-06-16 DIAGNOSIS — F10231 Alcohol dependence with withdrawal delirium: Secondary | ICD-10-CM | POA: Diagnosis not present

## 2022-06-16 DIAGNOSIS — F10120 Alcohol abuse with intoxication, uncomplicated: Secondary | ICD-10-CM | POA: Insufficient documentation

## 2022-06-16 DIAGNOSIS — F10939 Alcohol use, unspecified with withdrawal, unspecified: Secondary | ICD-10-CM | POA: Diagnosis not present

## 2022-06-16 DIAGNOSIS — I10 Essential (primary) hypertension: Secondary | ICD-10-CM | POA: Insufficient documentation

## 2022-06-16 LAB — ETHANOL: Alcohol, Ethyl (B): 330 mg/dL (ref <10)

## 2022-06-16 LAB — COMPREHENSIVE METABOLIC PANEL
ALT: 33 U/L (ref 0–44)
AST: 55 U/L — ABNORMAL HIGH (ref 15–41)
Albumin: 4.8 g/dL (ref 3.5–5.0)
Alkaline Phosphatase: 62 U/L (ref 38–126)
Anion gap: 24 — ABNORMAL HIGH (ref 5–15)
BUN: 9 mg/dL (ref 6–20)
CO2: 18 mmol/L — ABNORMAL LOW (ref 22–32)
Calcium: 9.2 mg/dL (ref 8.9–10.3)
Chloride: 91 mmol/L — ABNORMAL LOW (ref 98–111)
Creatinine, Ser: 0.59 mg/dL (ref 0.44–1.00)
GFR, Estimated: >60 mL/min (ref >60)
Glucose, Bld: 125 mg/dL — ABNORMAL HIGH (ref 70–99)
Potassium: 3.8 mmol/L (ref 3.5–5.1)
Sodium: 133 mmol/L — ABNORMAL LOW (ref 135–145)
Total Bilirubin: 1.7 mg/dL — ABNORMAL HIGH (ref 0.3–1.2)
Total Protein: 8.7 g/dL — ABNORMAL HIGH (ref 6.5–8.1)

## 2022-06-16 LAB — RAPID URINE DRUG SCREEN, HOSP PERFORMED
Amphetamines: NOT DETECTED
Barbiturates: NOT DETECTED
Benzodiazepines: NOT DETECTED
Cocaine: NOT DETECTED
Opiates: NOT DETECTED
Tetrahydrocannabinol: POSITIVE — AB

## 2022-06-16 LAB — CBC
HCT: 40.3 % (ref 36.0–46.0)
Hemoglobin: 14.4 g/dL (ref 12.0–15.0)
MCH: 31.6 pg (ref 26.0–34.0)
MCHC: 35.7 g/dL (ref 30.0–36.0)
MCV: 88.4 fL (ref 80.0–100.0)
Platelets: 146 10*3/uL — ABNORMAL LOW (ref 150–400)
RBC: 4.56 MIL/uL (ref 3.87–5.11)
RDW: 14.1 % (ref 11.5–15.5)
WBC: 10.1 10*3/uL (ref 4.0–10.5)
nRBC: 0 % (ref 0.0–0.2)

## 2022-06-16 LAB — PREGNANCY, URINE: Preg Test, Ur: NEGATIVE

## 2022-06-16 MED ORDER — ONDANSETRON 4 MG PO TBDP: 4.0000 mg | ORAL_TABLET | Freq: Three times a day (TID) | ORAL | 0 refills | Status: DC | PRN

## 2022-06-16 MED ORDER — THIAMINE HCL 100 MG/ML IJ SOLN
100.0000 mg | Freq: Once | INTRAMUSCULAR | Status: AC
Start: 2022-06-16 — End: 2022-06-16
  Administered 2022-06-16: 100 mg via INTRAVENOUS
  Filled 2022-06-16: qty 2

## 2022-06-16 MED ORDER — ONDANSETRON HCL 4 MG/2ML IJ SOLN
4.0000 mg | Freq: Once | INTRAMUSCULAR | Status: AC
  Administered 2022-06-16: 4 mg via INTRAVENOUS
  Filled 2022-06-16: qty 2

## 2022-06-16 MED ORDER — SODIUM CHLORIDE 0.9 % IV BOLUS
1000.0000 mL | Freq: Once | INTRAVENOUS | Status: AC
  Administered 2022-06-16: 1000 mL via INTRAVENOUS

## 2022-06-16 NOTE — ED Notes (Signed)
Pt awaiting [facility] Popper ride home.

## 2022-06-16 NOTE — ED Provider Notes (Signed)
Emergency Department Provider Note   I have reviewed the triage vital signs and the nursing notes.   HISTORY  Chief Complaint Withdrawal   HPI Cynthia Salazar is a 31 y.o. female past history of alcohol abuse presents to the emergency department with nausea and vomiting.  Patient reports concern for alcohol detox.  He drove herself here today.  She states last time she drank was 5 PM this afternoon.  She typically drinks 1 pint of liquor daily, occasionally more.  She uses marijuana occasionally but denies other drugs.  No prior history of DTs or seizure. Notes that she has gone through outpatient rehab in the past.    Past Medical History:  Diagnosis Date   Abnormal liver function test 03/18/2013   Alcohol use 06/03/2015   Allergy    Asthma    Chicken pox as a child   Depression with anxiety    Essential hypertension 06/03/2015   Insomnia 03/17/2013   Other and unspecified hyperlipidemia 03/18/2013    Review of Systems  Constitutional: No fever/chills Cardiovascular: Denies chest pain. Respiratory: Denies shortness of breath. Gastrointestinal: No abdominal pain.  Positive nausea and  vomiting.  No diarrhea.  No constipation. Genitourinary: Negative for dysuria. Musculoskeletal: Negative for back pain. Skin: Negative for rash. Neurological: Negative for headaches, focal weakness or numbness.  ____________________________________________   PHYSICAL EXAM:  VITAL SIGNS: ED Triage Vitals [06/16/22 0156]  Enc Vitals Group     BP (!) 160/108     Pulse Rate (!) 132     Resp      Temp 98.9 F (37.2 C)     Temp Source Oral     SpO2 98 %   Constitutional: Alert and oriented. Patient appears clinically intoxicated.  Eyes: Conjunctivae are normal.  Head: Atraumatic. Nose: No congestion/rhinnorhea. Mouth/Throat: Mucous membranes are dry.  Neck: No stridor.   Cardiovascular: Tachycardia. Good peripheral circulation. Grossly normal heart sounds.   Respiratory:  Normal respiratory effort.  No retractions. Lungs CTAB. Gastrointestinal: Soft and nontender. No distention.  Musculoskeletal:  No gross deformities of extremities. Neurologic:  Normal speech and language. Mild tremor noted. 5/5 strength in the bilateral upper/lower extremities.  Skin:  Skin is warm, dry and intact. Bruising to the bilateral knees appears old. No laceration or active bleeding.   ____________________________________________   LABS (all labs ordered are listed, but only abnormal results are displayed)  Labs Reviewed  COMPREHENSIVE METABOLIC PANEL - Abnormal; Notable for the following components:      Result Value   Sodium 133 (*)    Chloride 91 (*)    CO2 18 (*)    Glucose, Bld 125 (*)    Total Protein 8.7 (*)    AST 55 (*)    Total Bilirubin 1.7 (*)    Anion gap 24 (*)    All other components within normal limits  ETHANOL - Abnormal; Notable for the following components:   Alcohol, Ethyl (B) 330 (*)    All other components within normal limits  CBC - Abnormal; Notable for the following components:   Platelets 146 (*)    All other components within normal limits  RAPID URINE DRUG SCREEN, HOSP PERFORMED - Abnormal; Notable for the following components:   Tetrahydrocannabinol POSITIVE (*)    All other components within normal limits  PREGNANCY, URINE    ____________________________________________   PROCEDURES  Procedure(s) performed:   Procedures  None  ____________________________________________   INITIAL IMPRESSION / ASSESSMENT AND PLAN / ED COURSE  Pertinent  labs & imaging results that were available during my care of the patient were reviewed by me and considered in my medical decision making (see chart for details).   This patient is Presenting for Evaluation of EtOH abuse, which does require a range of treatment options, and is a complaint that involves a high risk of morbidity and mortality.  The Differential Diagnoses include DTs,  withdrawal seizure, alcohol intoxication, pancreatitis, Wernicke's encephalopathy, etc.  Critical Interventions-    Medications  sodium chloride 0.9 % bolus 1,000 mL (0 mLs Intravenous Stopped 06/16/22 0334)  ondansetron (ZOFRAN) injection 4 mg (4 mg Intravenous Given 06/16/22 0221)  thiamine (VITAMIN B1) injection 100 mg (100 mg Intravenous Given 06/16/22 0220)    Reassessment after intervention:  Patient up and ambulatory.    I decided to review pertinent External Data, and in summary patient seen in the ED in August of this year with alcohol intoxication.   Clinical Laboratory Tests Ordered, included CBC without leukocytosis. EtOH at 330. Pregnancy negative.   Cardiac Monitor Tracing which shows sinus tachycardia.   Social Determinants of Health Risk patient with EtOH abuse history.    Medical Decision Making: Summary:  Presents emergency department with vomiting and mild tremor concern for alcohol detox however continues to drink heavily including earlier this afternoon.  Overall low suspicion for acute alcohol complication related to withdrawal.  Plan for CIWA, IV fluids, nausea medication and medical screening blood work with reassessment.   CIWA 14.  Reevaluation with update and discussion with patient at 03:33 AM. She is requesting discharge and is no longer interested in medical detox. CIWA elevated but question if many of the subjective symptoms elevating score are related to active intoxication. Remains tachy here. No AKI. I am ok with d/c with EtOH treatment resources but patient will have to secure a ride home. Patient verbalizes understanding. Tolerating PO and ambulatory here without difficulty.   Disposition: discharge  ____________________________________________  FINAL CLINICAL IMPRESSION(S) / ED DIAGNOSES  Final diagnoses:  Alcoholic intoxication without complication ([place])     NEW OUTPATIENT MEDICATIONS STARTED DURING THIS VISIT:  New Prescriptions    ONDANSETRON (ZOFRAN-ODT) 4 MG DISINTEGRATING TABLET    Take 1 tablet (4 mg total) by mouth every 8 (eight) hours as needed.    Note:  This document was prepared using Dragon voice recognition software and may include unintentional dictation errors.  Nanda Quinton, MD, [facility]    Savon Cobbs, Wonda Olds, MD 06/16/22 [phone]

## 2022-06-16 NOTE — ED Notes (Signed)
Pt reports she want's to go home and is ready to leave. Provider notified. Informed pt that she would need to find someone to pick her up in order to leave due to elevated ethanol level.

## 2022-06-16 NOTE — ED Notes (Signed)
Pt escorted from ED by this RN. This RN witnessed pt enter [place] vehicle and vehicle exiting parking lot.

## 2022-06-16 NOTE — Discharge Instructions (Signed)
Substance Abuse Treatment Programs ° °Intensive Outpatient Programs °[facility]     °[address]      °[place], [place]                   °[phone]      ° °[facility] °[address] °[place], [place] °[phone] ° °[facility]     °(Inpatient and outpatient)     °[address]           °[phone]   ° °[facility] °[phone] (Suboxone and Methadone) ° °[address]      °[place], [place] [address]      °[phone]      ° °[address] °[place], [place] °[phone] ° °[facility] (Outpatient/Inpatient, [facility])    °(insurance only) [phone]      °       °[facility] (Groups & Residential) °[place], [place] °[phone] ° °   °[facility]     °[address]     °[place], [place]      °[phone]      ° °[facility] (for caregivers and family) °[address] °[place], [place] °[phone] ° ° ° ° ° °Residential Treatment Programs °[facility]      °[address], [place], [place] [address]  °[phone]      ° °[facility] °[address] [place], [place] [address] °[phone] ° °[facility]        °[phone]      ° °[facility] °[phone] ° °[facility]             °[address]                                         °[place], [place]                                                °[phone] or [phone]                              ° °[facility] [place] °[address] °[place], [address] °[phone] ° °[facility]    °[address]      °[place], [place]     °[phone]      ° °[facility] °[address] °[place], [place] °[phone] ° °[facility]   °[address]     °[place], [place] [address]     °[phone]      °Admissions: 8am-3pm M-F ° °[facility] °[address] °[place],  [place] °[phone] ° °[facility]: Residential Program (90 Days)   °[place], [place]      °[phone] or [phone]    ° °[facility] °[place], [place] °(Walk in Hours over the weekend or by referral) ° °[facility] °[address], [place],  [address] °[phone] ° °Crisis Mobile: [facility]:  1-877-626-1772 (for crisis response 24 hours a day) °[facility] Hotline:      1-800-256-2452 °Outpatient Psychiatry and Counseling ° °[facility]: Mobile Crisis   Management 24 hours:  1-877-626-1772 ° °[facility] sliding scale fee and walk in schedule: M-F 8am-12pm/1pm-3pm °[address]  °[place], [place] [address] °[phone] ° °[facility] °[address] °[place], [place] [address] °[phone] ° °[facility] ([facility])- new patient walk-in appointments available Monday - Friday 8am -3pm.          °[address] °[place], [place] [address] °336-676-6840 or crisis line- 336-676-6905 ° °[facility]/ Intensive Outpatient Therapy Program °[address] °[place], [place] [address] °336-832-9804 ° °[facility]                  °Crisis Services      °[phone]      °[address]     °[place], [place] [address]                ° °[facility]   °[facility] °[phone] °[address] °[place], [place] [address] ° ° °[facility]          °[address]  °[place], [place] [address]       °[phone] ° °[facility] °[address] °[place], [place] [address] °336-292-1510 ° °[facility]    °[address]    °[place], [place] [address]     °[phone]      ° °Parish McKinney, MD     °[address]     °[place] [address]     °[phone]     °  °[facility] °[address] °[place] [address] ° °[facility]     °[address]     °[place], [place]      °336-542-2076      ° °[facility] °Shamsher Ahluwalia, MD °[address] °[place], [place] [address] °[phone] ° °[facility]     °[address]     °[place], [place] [address]     °336-274-1237      ° °[facility] °[address] °[place], [place] [address] °336-854-4450 °Resources for Temporary Residential Assistance/Crisis Centers ° °DAY CENTERS °[facility] °M-F 8am-3pm   °[address] [place], [place] [address]   336-332-0824 °Services include: laundry, barbering, support groups, case management, phone  & computer access, showers, AA/NA mtgs, mental health/substance abuse nurse, job skills class, disability information, VA assistance, spiritual classes, etc.  ° °HOMELESS SHELTERS ° °[facility]     °[facility]   °[address], [place]     °[phone]       °       °[facility] (women and children)       °[address] °[place], [place] [address] °336-275-0820 °[email or web address] for application and process °Application Required ° °[facility]   °[address]    °[place] [address]     °[phone]       °             °[facility] °[address] °[place], [place] [address] °[phone] °336-235-0363(schedule application appt.) °Application Required ° °[facility] (women only)    °[address]     °[place],  [address]     °336-884-1039      °  Intake starts 6pm daily °Need valid ID, SSC, & Police report °[facility] [place] °[address] °[place], [place] °336-881-5420 °Application Required ° °[facility] (men only)     °[address]      °[place], [place]     °[phone]      ° °[facility] °(Pregnant women only) °[address] °[place], [place] °336-275-0206 ° °[facility]      °[address]      °[place], [place] [address]     °[phone]      °       °[facility] °[address] °[place], [place] °336-723-1848 °90 day commitment/SA/Application process ° °[facility](men only)     °[address]     °[place], [place]     °[phone]       °Check-in at 7pm     °       °[facility] °[address] °[place],  [address] °336-248-6684 °Men/Women/Women and Children must be there by 7 pm ° °[facility] °[place],  °[phone]                ° °

## 2022-06-16 NOTE — ED Triage Notes (Signed)
Pt presents for withdrawal sx from EtOH. Last drink yesterday 5pm, normally drinks a pint of vodka. Endorses nausea, tremors, sweating. Has never experienced a seizure from detoxing.

## 2022-06-17 ENCOUNTER — Inpatient Hospital Stay (HOSPITAL_BASED_OUTPATIENT_CLINIC_OR_DEPARTMENT_OTHER)
Admission: EM | Admit: 2022-06-17 | Discharge: 2022-06-20 | DRG: 897 | Disposition: A | Discharge status: Rehabilitation Facility | Payer: Commercial Managed Care - HMO | Attending: Hospitalist | Admitting: Hospitalist

## 2022-06-17 ENCOUNTER — Emergency Department (HOSPITAL_BASED_OUTPATIENT_CLINIC_OR_DEPARTMENT_OTHER): Payer: Commercial Managed Care - HMO

## 2022-06-17 ENCOUNTER — Encounter (HOSPITAL_BASED_OUTPATIENT_CLINIC_OR_DEPARTMENT_OTHER): Payer: Self-pay

## 2022-06-17 ENCOUNTER — Other Ambulatory Visit: Payer: Self-pay

## 2022-06-17 ENCOUNTER — Inpatient Hospital Stay (HOSPITAL_COMMUNITY): Payer: Commercial Managed Care - HMO

## 2022-06-17 ENCOUNTER — Encounter (HOSPITAL_COMMUNITY): Payer: Self-pay

## 2022-06-17 DIAGNOSIS — Z9152 Personal history of nonsuicidal self-harm: Secondary | ICD-10-CM | POA: Diagnosis not present

## 2022-06-17 DIAGNOSIS — F32A Depression, unspecified: Secondary | ICD-10-CM | POA: Diagnosis present

## 2022-06-17 DIAGNOSIS — Z8249 Family history of ischemic heart disease and other diseases of the circulatory system: Secondary | ICD-10-CM

## 2022-06-17 DIAGNOSIS — F10930 Alcohol use, unspecified with withdrawal, uncomplicated: Principal | ICD-10-CM

## 2022-06-17 DIAGNOSIS — F418 Other specified anxiety disorders: Secondary | ICD-10-CM | POA: Diagnosis not present

## 2022-06-17 DIAGNOSIS — F10231 Alcohol dependence with withdrawal delirium: Principal | ICD-10-CM | POA: Diagnosis present

## 2022-06-17 DIAGNOSIS — R7989 Other specified abnormal findings of blood chemistry: Secondary | ICD-10-CM | POA: Diagnosis present

## 2022-06-17 DIAGNOSIS — D6959 Other secondary thrombocytopenia: Secondary | ICD-10-CM | POA: Diagnosis present

## 2022-06-17 DIAGNOSIS — F10939 Alcohol use, unspecified with withdrawal, unspecified: Secondary | ICD-10-CM | POA: Diagnosis present

## 2022-06-17 DIAGNOSIS — I10 Essential (primary) hypertension: Secondary | ICD-10-CM | POA: Diagnosis present

## 2022-06-17 DIAGNOSIS — Y908 Blood alcohol level of 240 mg/100 ml or more: Secondary | ICD-10-CM | POA: Diagnosis present

## 2022-06-17 DIAGNOSIS — G4700 Insomnia, unspecified: Secondary | ICD-10-CM | POA: Diagnosis present

## 2022-06-17 DIAGNOSIS — Z87891 Personal history of nicotine dependence: Secondary | ICD-10-CM | POA: Diagnosis not present

## 2022-06-17 DIAGNOSIS — F10220 Alcohol dependence with intoxication, uncomplicated: Secondary | ICD-10-CM | POA: Diagnosis present

## 2022-06-17 DIAGNOSIS — F1210 Cannabis abuse, uncomplicated: Secondary | ICD-10-CM | POA: Diagnosis present

## 2022-06-17 DIAGNOSIS — Z20822 Contact with and (suspected) exposure to covid-19: Secondary | ICD-10-CM | POA: Diagnosis present

## 2022-06-17 DIAGNOSIS — F1090 Alcohol use, unspecified, uncomplicated: Secondary | ICD-10-CM | POA: Diagnosis present

## 2022-06-17 DIAGNOSIS — E785 Hyperlipidemia, unspecified: Secondary | ICD-10-CM | POA: Diagnosis present

## 2022-06-17 DIAGNOSIS — Z789 Other specified health status: Secondary | ICD-10-CM | POA: Diagnosis present

## 2022-06-17 DIAGNOSIS — R609 Edema, unspecified: Secondary | ICD-10-CM | POA: Diagnosis not present

## 2022-06-17 DIAGNOSIS — J45909 Unspecified asthma, uncomplicated: Secondary | ICD-10-CM | POA: Diagnosis present

## 2022-06-17 DIAGNOSIS — Z91148 Patient's other noncompliance with medication regimen for other reason: Secondary | ICD-10-CM

## 2022-06-17 DIAGNOSIS — Z825 Family history of asthma and other chronic lower respiratory diseases: Secondary | ICD-10-CM | POA: Diagnosis not present

## 2022-06-17 DIAGNOSIS — F419 Anxiety disorder, unspecified: Secondary | ICD-10-CM | POA: Diagnosis present

## 2022-06-17 DIAGNOSIS — D696 Thrombocytopenia, unspecified: Secondary | ICD-10-CM

## 2022-06-17 DIAGNOSIS — E876 Hypokalemia: Secondary | ICD-10-CM | POA: Diagnosis not present

## 2022-06-17 DIAGNOSIS — R7309 Other abnormal glucose: Secondary | ICD-10-CM

## 2022-06-17 DIAGNOSIS — K760 Fatty (change of) liver, not elsewhere classified: Secondary | ICD-10-CM | POA: Diagnosis present

## 2022-06-17 DIAGNOSIS — R000 Tachycardia, unspecified: Secondary | ICD-10-CM

## 2022-06-17 DIAGNOSIS — R7401 Elevation of levels of liver transaminase levels: Secondary | ICD-10-CM

## 2022-06-17 LAB — COMPREHENSIVE METABOLIC PANEL
ALT: 43 U/L (ref 0–44)
ALT: 54 U/L — ABNORMAL HIGH (ref 0–44)
AST: 90 U/L — ABNORMAL HIGH (ref 15–41)
AST: 128 U/L — ABNORMAL HIGH (ref 15–41)
Albumin: 4.8 g/dL (ref 3.5–5.0)
Albumin: 3.7 g/dL (ref 3.5–5.0)
Alkaline Phosphatase: 64 U/L (ref 38–126)
Alkaline Phosphatase: 57 U/L (ref 38–126)
Anion gap: 16 — ABNORMAL HIGH (ref 5–15)
Anion gap: 12 (ref 5–15)
BUN: 5 mg/dL — ABNORMAL LOW (ref 6–20)
BUN: <5 mg/dL — ABNORMAL LOW (ref 6–20)
CO2: 27 mmol/L (ref 22–32)
CO2: 27 mmol/L (ref 22–32)
Calcium: 9.3 mg/dL (ref 8.9–10.3)
Calcium: 8 mg/dL — ABNORMAL LOW (ref 8.9–10.3)
Chloride: 95 mmol/L — ABNORMAL LOW (ref 98–111)
Chloride: 94 mmol/L — ABNORMAL LOW (ref 98–111)
Creatinine, Ser: 0.7 mg/dL (ref 0.44–1.00)
Creatinine, Ser: 0.49 mg/dL (ref 0.44–1.00)
GFR, Estimated: >60 mL/min (ref >60)
GFR, Estimated: >60 mL/min (ref >60)
Glucose, Bld: 126 mg/dL — ABNORMAL HIGH (ref 70–99)
Glucose, Bld: 106 mg/dL — ABNORMAL HIGH (ref 70–99)
Potassium: 3.7 mmol/L (ref 3.5–5.1)
Potassium: 3.1 mmol/L — ABNORMAL LOW (ref 3.5–5.1)
Sodium: 138 mmol/L (ref 135–145)
Sodium: 133 mmol/L — ABNORMAL LOW (ref 135–145)
Total Bilirubin: 1 mg/dL (ref 0.3–1.2)
Total Bilirubin: 1.7 mg/dL — ABNORMAL HIGH (ref 0.3–1.2)
Total Protein: 8.8 g/dL — ABNORMAL HIGH (ref 6.5–8.1)
Total Protein: 6.8 g/dL (ref 6.5–8.1)

## 2022-06-17 LAB — CBC
HCT: 40.8 % (ref 36.0–46.0)
Hemoglobin: 14.1 g/dL (ref 12.0–15.0)
MCH: 30.7 pg (ref 26.0–34.0)
MCHC: 34.6 g/dL (ref 30.0–36.0)
MCV: 88.9 fL (ref 80.0–100.0)
Platelets: 105 10*3/uL — ABNORMAL LOW (ref 150–400)
RBC: 4.59 MIL/uL (ref 3.87–5.11)
RDW: 14.1 % (ref 11.5–15.5)
WBC: 6.5 10*3/uL (ref 4.0–10.5)
nRBC: 0 % (ref 0.0–0.2)

## 2022-06-17 LAB — BRAIN NATRIURETIC PEPTIDE: B Natriuretic Peptide: 20.1 pg/mL (ref 0.0–100.0)

## 2022-06-17 LAB — HIV ANTIBODY (ROUTINE TESTING W REFLEX): HIV Screen 4th Generation wRfx: NONREACTIVE

## 2022-06-17 LAB — CBC WITH DIFFERENTIAL/PLATELET
Abs Immature Granulocytes: 0.01 10*3/uL (ref 0.00–0.07)
Basophils Absolute: 0 10*3/uL (ref 0.0–0.1)
Basophils Relative: 1 %
Eosinophils Absolute: 0 10*3/uL (ref 0.0–0.5)
Eosinophils Relative: 1 %
HCT: 33.6 % — ABNORMAL LOW (ref 36.0–46.0)
Hemoglobin: 11.6 g/dL — ABNORMAL LOW (ref 12.0–15.0)
Immature Granulocytes: 0 %
Lymphocytes Relative: 8 %
Lymphs Abs: 0.3 10*3/uL — ABNORMAL LOW (ref 0.7–4.0)
MCH: 32 pg (ref 26.0–34.0)
MCHC: 34.5 g/dL (ref 30.0–36.0)
MCV: 92.8 fL (ref 80.0–100.0)
Monocytes Absolute: 0.3 10*3/uL (ref 0.1–1.0)
Monocytes Relative: 6 %
Neutro Abs: 3.4 10*3/uL (ref 1.7–7.7)
Neutrophils Relative %: 84 %
Platelets: 54 10*3/uL — ABNORMAL LOW (ref 150–400)
RBC: 3.62 MIL/uL — ABNORMAL LOW (ref 3.87–5.11)
RDW: 13.9 % (ref 11.5–15.5)
WBC: 4.1 10*3/uL (ref 4.0–10.5)
nRBC: 0 % (ref 0.0–0.2)

## 2022-06-17 LAB — MRSA NEXT GEN BY PCR, NASAL: MRSA by PCR Next Gen: NOT DETECTED

## 2022-06-17 LAB — RAPID URINE DRUG SCREEN, HOSP PERFORMED
Amphetamines: NOT DETECTED
Barbiturates: NOT DETECTED
Benzodiazepines: NOT DETECTED
Cocaine: NOT DETECTED
Opiates: NOT DETECTED
Tetrahydrocannabinol: POSITIVE — AB

## 2022-06-17 LAB — RESP PANEL BY RT-PCR (FLU A&B, COVID) ARPGX2
Influenza A by PCR: NEGATIVE
Influenza B by PCR: NEGATIVE
SARS Coronavirus 2 by RT PCR: NEGATIVE

## 2022-06-17 LAB — TROPONIN I (HIGH SENSITIVITY): Troponin I (High Sensitivity): 8 ng/L (ref <18)

## 2022-06-17 LAB — ETHANOL: Alcohol, Ethyl (B): 442 mg/dL (ref <10)

## 2022-06-17 LAB — PREGNANCY, URINE: Preg Test, Ur: NEGATIVE

## 2022-06-17 LAB — D-DIMER, QUANTITATIVE: D-Dimer, Quant: 0.66 ug/mL-FEU — ABNORMAL HIGH (ref 0.00–0.50)

## 2022-06-17 MED ORDER — THIAMINE MONONITRATE 100 MG PO TABS
100.0000 mg | ORAL_TABLET | Freq: Every day | ORAL | Status: DC
  Administered 2022-06-17 – 2022-06-20 (×4): 100 mg via ORAL
  Filled 2022-06-17 (×4): qty 1

## 2022-06-17 MED ORDER — ONDANSETRON HCL 4 MG/2ML IJ SOLN
4.0000 mg | Freq: Four times a day (QID) | INTRAMUSCULAR | Status: DC | PRN
  Administered 2022-06-17: 4 mg via INTRAVENOUS
  Filled 2022-06-17: qty 2

## 2022-06-17 MED ORDER — LORAZEPAM 2 MG/ML IJ SOLN
0.0000 mg | Freq: Four times a day (QID) | INTRAMUSCULAR | Status: AC
  Administered 2022-06-17: 1 mg via INTRAVENOUS
  Administered 2022-06-17: 2 mg via INTRAVENOUS
  Filled 2022-06-17 (×2): qty 1

## 2022-06-17 MED ORDER — LACTATED RINGERS IV BOLUS
1000.0000 mL | Freq: Once | INTRAVENOUS | Status: AC
  Administered 2022-06-17: 1000 mL via INTRAVENOUS

## 2022-06-17 MED ORDER — ORAL CARE MOUTH RINSE: 15.0000 mL | OROMUCOSAL | Status: DC | PRN

## 2022-06-17 MED ORDER — METOPROLOL TARTRATE 5 MG/5ML IV SOLN: 5.0000 mg | Freq: Four times a day (QID) | INTRAVENOUS | Status: DC | PRN

## 2022-06-17 MED ORDER — THIAMINE HCL 100 MG/ML IJ SOLN
100.0000 mg | Freq: Every day | INTRAMUSCULAR | Status: DC
  Filled 2022-06-17: qty 2

## 2022-06-17 MED ORDER — PHENOBARBITAL SODIUM 65 MG/ML IJ SOLN
260.0000 mg | Freq: Once | INTRAMUSCULAR | Status: DC
  Filled 2022-06-17: qty 4

## 2022-06-17 MED ORDER — LORAZEPAM 2 MG/ML IJ SOLN: 0.0000 mg | Freq: Two times a day (BID) | INTRAMUSCULAR | Status: DC

## 2022-06-17 MED ORDER — LACTATED RINGERS IV SOLN: INTRAVENOUS | Status: DC

## 2022-06-17 MED ORDER — LORAZEPAM 1 MG PO TABS: 0.0000 mg | ORAL_TABLET | Freq: Two times a day (BID) | ORAL | Status: DC

## 2022-06-17 MED ORDER — ALBUTEROL SULFATE (2.5 MG/3ML) 0.083% IN NEBU: 2.5000 mg | INHALATION_SOLUTION | RESPIRATORY_TRACT | Status: DC | PRN

## 2022-06-17 MED ORDER — IOHEXOL 350 MG/ML SOLN
100.0000 mL | Freq: Once | INTRAVENOUS | Status: AC | PRN
  Administered 2022-06-17: 100 mL via INTRAVENOUS

## 2022-06-17 MED ORDER — CHLORHEXIDINE GLUCONATE CLOTH 2 % EX PADS
6.0000 | MEDICATED_PAD | Freq: Every day | CUTANEOUS | Status: DC
  Administered 2022-06-17 – 2022-06-19 (×3): 6 via TOPICAL

## 2022-06-17 MED ORDER — SODIUM CHLORIDE 0.9 % IV SOLN
260.0000 mg | Freq: Once | INTRAVENOUS | Status: AC
  Administered 2022-06-17: 260 mg via INTRAVENOUS
  Filled 2022-06-17: qty 2

## 2022-06-17 MED ORDER — LORAZEPAM 1 MG PO TABS: 0.0000 mg | ORAL_TABLET | Freq: Four times a day (QID) | ORAL | Status: AC

## 2022-06-17 MED ORDER — ACETAMINOPHEN 325 MG PO TABS: 650.0000 mg | ORAL_TABLET | ORAL | Status: DC | PRN

## 2022-06-17 NOTE — ED Notes (Signed)
Dr. Roxanne Mins aware of elevated etoh of 442.

## 2022-06-17 NOTE — ED Notes (Signed)
Late entry - Pt having intermittent episodes of hypoxia -- 88% on RA - placed on 2L O2 via [place] with improvement to 98%

## 2022-06-17 NOTE — ED Provider Notes (Signed)
[place] EMERGENCY DEPT Provider Note   CSN: [id number] Arrival date & time: 06/17/22  0040     History  Chief Complaint  Patient presents with   Alcohol Problem    Cynthia Salazar is a 31 y.o. female.  The history is provided by the patient.  Alcohol Problem  She has a history of hypertension, hyperlipidemia, asthma, alcohol abuse and comes in stating that she wanted help with detox from alcohol.  She admits to drinking about a pint of liquor a day and had done so today.  She had been in the emergency department yesterday with similar complaints.  She states that she had texted a detox facility and was advised to return.  She admits to marijuana use but denies other drug use.  She denies homicidal or suicidal ideation.  She denies hallucinations.  She has never had to be admitted to the hospital for her alcohol problem.   Home Medications Prior to Admission medications   Medication Sig Start Date End Date Taking? Authorizing Provider  albuterol (VENTOLIN HFA) 108 (90 Base) MCG/ACT inhaler Inhale 2 puffs into the lungs every 6 (six) hours as needed. 11/08/21   Mosie Lukes, MD  ondansetron (ZOFRAN-ODT) 4 MG disintegrating tablet Take 1 tablet (4 mg total) by mouth every 8 (eight) hours as needed. 06/16/22   Long, Wonda Olds, MD      Allergies    Aleve [naproxen sodium]    Review of Systems   Review of Systems  All other systems reviewed and are negative.   Physical Exam Updated Vital Signs BP (!) 118/93   Pulse (!) 128   Temp 98.4 F (36.9 C)   Resp 20   Ht 5\' 1"  (1.549 m)   Wt 68 kg   SpO2 95%   BMI 28.34 kg/m  Physical Exam Vitals and nursing note reviewed.   31 year old female, resting comfortably and in no acute distress. Vital signs are significant for elevated diastolic blood pressure, and elevated heart rate. Oxygen saturation is 95%, which is normal. Head is normocephalic and atraumatic. PERRLA, EOMI. Oropharynx is clear. Neck is  nontender and supple without adenopathy or JVD. Back is nontender and there is no CVA tenderness. Lungs are clear without rales, wheezes, or rhonchi. Chest is nontender. Heart is tachycardic without murmur. Abdomen is soft, flat, nontender. Extremities have no cyanosis or edema, full range of motion is present. Skin is warm and dry without rash. Neurologic: Mental status is normal, cranial nerves are intact, mildly tremulous, moves all extremities equally.  ED Results / Procedures / Treatments   Labs (all labs ordered are listed, but only abnormal results are displayed) Labs Reviewed  COMPREHENSIVE METABOLIC PANEL - Abnormal; Notable for the following components:      Result Value   Chloride 95 (*)    Glucose, Bld 126 (*)    BUN 5 (*)    Total Protein 8.8 (*)    AST 90 (*)    Anion gap 16 (*)    All other components within normal limits  ETHANOL - Abnormal; Notable for the following components:   Alcohol, Ethyl (B) 442 (*)    All other components within normal limits  CBC - Abnormal; Notable for the following components:   Platelets 105 (*)    All other components within normal limits  RAPID URINE DRUG SCREEN, HOSP PERFORMED - Abnormal; Notable for the following components:   Tetrahydrocannabinol POSITIVE (*)    All other components within normal limits  RESP PANEL BY RT-PCR (FLU A&B, COVID) ARPGX2  PREGNANCY, URINE   Procedures Procedures  Cardiac monitor shows sinus tachycardia, per my interpretation.  Medications Ordered in ED Medications  lactated ringers bolus 1,000 mL (has no administration in time range)  lactated ringers infusion (has no administration in time range)  LORazepam (ATIVAN) injection 0-4 mg (has no administration in time range)    Or  LORazepam (ATIVAN) tablet 0-4 mg (has no administration in time range)  LORazepam (ATIVAN) injection 0-4 mg (has no administration in time range)    Or  LORazepam (ATIVAN) tablet 0-4 mg (has no administration in time  range)  thiamine (VITAMIN B1) tablet 100 mg (has no administration in time range)    Or  thiamine (VITAMIN B1) injection 100 mg (has no administration in time range)  acetaminophen (TYLENOL) tablet 650 mg (has no administration in time range)  ondansetron (ZOFRAN) injection 4 mg (has no administration in time range)    ED Course/ Medical Decision Making/ A&P Clinical Course as of 06/17/22 0720  Sun Jun 17, 2022  0707 Tetrahydrocannabinol(!): POSITIVE [JL]  0708 Alcohol, Ethyl (B)(!!): 442 [JL]    Clinical Course User Index [JL] Ernie Avena, MD                           Medical Decision Making Amount and/or Complexity of Data Reviewed Labs: ordered. Decision-making details documented in ED Course.  Risk OTC drugs. Prescription drug management.   Alcohol intoxication with early withdrawal symptoms.  I have reviewed and interpreted her laboratory tests, my interpretation is as severe alcohol intoxication with ethanol level of 442, normal CBC, mild elevation of random glucose, mild elevation of AST in range that she has been at previously.  I have ordered IV fluids and have started her on CIWA protocol.  Old records are reviewed, and she has numerous ED visits for alcohol intoxication, including yesterday.  No hospitalizations for alcohol related issues are in our record.  Following above-noted treatment, patient is resting comfortably and is no longer tremulous.  However, tachycardia has persisted and I have ordered additional IV fluids.  She is no longer hypertensive.  If tachycardia resolves, she should be able to be discharged with a benzodiazepine taper and referral to outpatient detox programs.  Case and signed out to Dr. Karene Fry.  CRITICAL CARE Performed by: Dione Booze Total critical care time: 45 minutes Critical care time was exclusive of separately billable procedures and treating other patients. Critical care was necessary to treat or prevent imminent or life-threatening  deterioration. Critical care was time spent personally by me on the following activities: development of treatment plan with patient and/or surrogate as well as nursing, discussions with consultants, evaluation of patient's response to treatment, examination of patient, obtaining history from patient or surrogate, ordering and performing treatments and interventions, ordering and review of laboratory studies, ordering and review of radiographic studies, pulse oximetry and re-evaluation of patient's condition.  Final Clinical Impression(s) / ED Diagnoses Final diagnoses:  Alcohol withdrawal syndrome without complication (HCC)  Tachycardia  Thrombocytopenia (HCC)  Elevated AST (SGOT)  Elevated random blood glucose level    Rx / DC Orders ED Discharge Orders     None         Dione Booze, MD 06/17/22 [phone]

## 2022-06-17 NOTE — ED Notes (Signed)
This nurse remains at bedside with patient due to safety risk -- pt restless and fidgety - keeps attempting to exit bed and trying to pull off monitoring cords.  Initially reporting she needs to stand up then reports she needs to "throw up on the floor"  and states the only way she can throw up is while sitting on the floor -- Dr. Roxanne Mins sent secure chat with updated on patient status (CIWA scoring and elevated HR)

## 2022-06-17 NOTE — ED Notes (Signed)
Pt now more calm and relaxed -- no longer trying to exit bed - remains cooperative and compliant with staff.  1L LR bolus now infusing -- continuous cardiac and pulse ox maintained.  Will monitor for acute changes and maintain plan of care.

## 2022-06-17 NOTE — ED Notes (Signed)
ED Provider at bedside. 

## 2022-06-17 NOTE — Progress Notes (Signed)
Plan of Care Note for accepted transfer   Patient: Fariha Goto MRN: [id number]   DOA: 06/17/2022  Facility requesting transfer: [facility] Requesting Provider: Dr. Armandina Gemma Reason for transfer: EtOH withdrawal Facility course: 31 yo F w/ PMHx of depression, EtOH abuse, asthma, mj abuse. Presenting with tachycardia and agitation. W/u revealed EtOH withdrawal. She's being checked for PTE as well. She was started on CIWA protocol.    Plan of care: The patient is accepted for admission to [facility] unit, at [facility]..  While holding at [facility], medical decision making responsibilities remain with the EDP. Upon arrival to [facility], [facility] will assume care. Thank you.  Author: Jonnie Finner, DO 06/17/2022  Check [email or web address] for on-call coverage.  Nursing staff, Please call [employer] number on Amion as soon as patient's arrival, so appropriate admitting provider can evaluate the pt.

## 2022-06-17 NOTE — ED Triage Notes (Signed)
Pt presents for "detoxing" from alcohol. Pt states that she usually drinks a pint of vodka daily. Last drink yesterday evening. Pt reports vomiting.

## 2022-06-17 NOTE — ED Notes (Signed)
Placed on 2L [place] due to low sats 89-90%

## 2022-06-17 NOTE — ED Provider Notes (Addendum)
  Physical Exam  BP 106/64   Pulse (!) 110   Temp 99.2 F (37.3 C) (Oral)   Resp (!) 22   Ht 5\' 1"  (1.549 m)   Wt 68 kg   SpO2 97%   BMI 28.34 kg/m     Procedures  .Critical Care  Performed by: Regan Lemming, MD Authorized by: Regan Lemming, MD   Critical care provider statement:    Critical care time (minutes):  30   Critical care was time spent personally by me on the following activities:  Development of treatment plan with patient or surrogate, discussions with consultants, evaluation of patient's response to treatment, examination of patient, ordering and review of laboratory studies, ordering and review of radiographic studies, ordering and performing treatments and interventions, pulse oximetry, re-evaluation of patient's condition and review of old charts   Care discussed with: admitting provider     ED Course / MDM   Clinical Course as of 06/17/22 1146  Sun Jun 17, 2022  0707 Tetrahydrocannabinol(!): POSITIVE [JL]  0708 Alcohol, Ethyl (B)(!!): 442 [JL]  0959 D-Dimer, Quant(!): 0.66 [JL]    Clinical Course User Index [JL] Regan Lemming, MD   Medical Decision Making Amount and/or Complexity of Data Reviewed Labs: ordered. Decision-making details documented in ED Course. Radiology: ordered.  Risk OTC drugs. Prescription drug management. Decision regarding hospitalization.   73F, presenting shaky, tremulous elevated EtOH level to 442. Concern actually for EtOH withdrawal this morning. On CIWA, plan for Librium taper, outpatient detox. If vitals don't improve with fluids, consider admission. Currently pulse 120s, would not DC.   On reassessment, the patient remained tachycardic with a heart rate in the 120s.  She drinks a pint of liquor a day.  She is interested in quitting drinking.  Her CIWA score on repeat assessment was a 7.  Due to persistent tachycardia with heart rates in the 120s, mild shortness of breath endorsed by the patient, an EKG, chest x-ray  and D-dimer was ordered.  Given her significant tachycardia despite fluid resuscitation, will admit for alcohol withdrawal.  D-dimer resulted positive at 0.66.  CTA PE study ordered.  CTA resulted negative: IMPRESSION:  There is no evidence of pulmonary artery embolism. There is no  evidence of thoracic aortic dissection. There is ectasia of main  pulmonary artery suggesting possible pulmonary arterial  hypertension.    There are slightly enlarged lymph nodes in mediastinum and both  hilar regions, possibly suggesting reactive hyperplasia of lymph  nodes. There is no focal pulmonary consolidation. There is no  pleural effusion.    Fatty liver.  Enlarged spleen.   Troponin and BNP also normal.  Patient tachycardia likely all due to alcohol withdrawal.  Only very mild elevation in LFTs, AST 90, ALT 43.  Will administer adjunct to 260 mg of phenobarbital rather than 10 mg/kg load as patient is already receiving benzodiazepines.  She remained hemodynamically stable at time of admission.  Patient is stable for stepdown.  Spoke with Dr. Marylyn Ishihara of the hospitalist service who accepted the patient in admission.    Regan Lemming, MD 06/17/22 1147    Regan Lemming, MD 06/17/22 1250

## 2022-06-17 NOTE — Plan of Care (Signed)
  Problem: Clinical Measurements: Goal: Respiratory complications will improve Outcome: Progressing   Problem: Nutrition: Goal: Adequate nutrition will be maintained Outcome: Progressing   Problem: Coping: Goal: Level of anxiety will decrease Outcome: Progressing   Problem: Elimination: Goal: Will not experience complications related to bowel motility Outcome: Progressing Goal: Will not experience complications related to urinary retention Outcome: Progressing   Problem: Pain Managment: Goal: General experience of comfort will improve Outcome: Progressing   

## 2022-06-17 NOTE — H&P (Signed)
History and Physical    Patient: Cynthia Salazar IYM:[id number] DOB: 12-07-1990 DOA: 06/17/2022 DOS: the patient was seen and examined on 06/17/2022 PCP: Bradd Canary, MD  Patient coming from: Home  Chief Complaint:  Chief Complaint  Patient presents with   Alcohol Problem   HPI: Cynthia Salazar is a 31 y.o. female with medical history significant of depression, EtOH abuse, asthma, mj abuse.Presenting with EtOH withdrawal. She reports that she was in her normal state of health until yesterday. She reports that she drinks at least a pint of liquor a day. She began feeling shaky yesterday morning, so she went to the ED. She was sent home with librium. She states that she felt worse when she got home. She became more shaky. She had N/V. She decided to come back to the ED for assistance. She denies any other aggravating or alleviating factors.    Review of Systems: As mentioned in the history of present illness. All other systems reviewed and are negative. Past Medical History:  Diagnosis Date   Abnormal liver function test 03/18/2013   Alcohol use 06/03/2015   Allergy    Asthma    Chicken pox as a child   Depression with anxiety    Essential hypertension 06/03/2015   Insomnia 03/17/2013   Other and unspecified hyperlipidemia 03/18/2013   Past Surgical History:  Procedure Laterality Date   WISDOM TOOTH EXTRACTION  19 yrs   Social History:  reports that she has quit smoking. She has never used smokeless tobacco. She reports current alcohol use. She reports current drug use. Drug: Marijuana.  Allergies  Allergen Reactions   Aleve [Naproxen Sodium]     wheeze    Family History  Problem Relation Age of Onset   Nephrolithiasis Father    Hypertension Father    Nephrolithiasis Brother    COPD Paternal Grandmother     Prior to Admission medications   Medication Sig Start Date End Date Taking? Authorizing Provider  albuterol (VENTOLIN HFA) 108 (90 Base) MCG/ACT inhaler Inhale  2 puffs into the lungs every 6 (six) hours as needed. 11/08/21   Bradd Canary, MD  ondansetron (ZOFRAN-ODT) 4 MG disintegrating tablet Take 1 tablet (4 mg total) by mouth every 8 (eight) hours as needed. 06/16/22   Maia Plan, MD    Physical Exam: Vitals:   06/17/22 1330 06/17/22 1430 06/17/22 1500 06/17/22 1541  BP: 105/73 119/81 127/83 (!) 136/91  Pulse: (!) 122 (!) 125 (!) 132 (!) 132  Resp: (!) 22 (!) 23 19   Temp:      TempSrc:      SpO2: 94% 96% 94%   Weight:      Height:       General: 31 y.o. female resting in bed in NAD Eyes: PERRL, normal sclera ENMT: Nares patent w/o discharge, orophaynx clear, dentition normal, ears w/o discharge/lesions/ulcers Neck: Supple, trachea midline Cardiovascular: tachy, +S1, S2, no m/g/r, equal pulses throughout Respiratory: CTABL, no w/r/r, normal WOB GI: BS+, NDNT, no masses noted, no organomegaly noted MSK: No e/c/c Neuro: A&O x 3, no focal deficits, tremulous Psyc: Appropriate interaction and flat affect, cooperative  Data Reviewed:  Results for orders placed or performed during the hospital encounter of 06/17/22 (from the past 24 hour(s))  Comprehensive metabolic panel     Status: Abnormal   Collection Time: 06/17/22  1:02 AM  Result Value Ref Range   Sodium 138 135 - 145 mmol/L   Potassium 3.7 3.5 - 5.1 mmol/L  Chloride 95 (L) 98 - 111 mmol/L   CO2 27 22 - 32 mmol/L   Glucose, Bld 126 (H) 70 - 99 mg/dL   BUN 5 (L) 6 - 20 mg/dL   Creatinine, Ser 0.70 0.44 - 1.00 mg/dL   Calcium 9.3 8.9 - 10.3 mg/dL   Total Protein 8.8 (H) 6.5 - 8.1 g/dL   Albumin 4.8 3.5 - 5.0 g/dL   AST 90 (H) 15 - 41 U/L   ALT 43 0 - 44 U/L   Alkaline Phosphatase 64 38 - 126 U/L   Total Bilirubin 1.0 0.3 - 1.2 mg/dL   GFR, Estimated >60 >60 mL/min   Anion gap 16 (H) 5 - 15  Ethanol     Status: Abnormal   Collection Time: 06/17/22  1:02 AM  Result Value Ref Range   Alcohol, Ethyl (B) 442 (HH) <10 mg/dL  cbc     Status: Abnormal   Collection Time:  06/17/22  1:02 AM  Result Value Ref Range   WBC 6.5 4.0 - 10.5 K/uL   RBC 4.59 3.87 - 5.11 MIL/uL   Hemoglobin 14.1 12.0 - 15.0 g/dL   HCT 40.8 36.0 - 46.0 %   MCV 88.9 80.0 - 100.0 fL   MCH 30.7 26.0 - 34.0 pg   MCHC 34.6 30.0 - 36.0 g/dL   RDW 14.1 11.5 - 15.5 %   Platelets 105 (L) 150 - 400 K/uL   nRBC 0.0 0.0 - 0.2 %  Rapid urine drug screen (hospital performed)     Status: Abnormal   Collection Time: 06/17/22  2:46 AM  Result Value Ref Range   Opiates NONE DETECTED NONE DETECTED   Cocaine NONE DETECTED NONE DETECTED   Benzodiazepines NONE DETECTED NONE DETECTED   Amphetamines NONE DETECTED NONE DETECTED   Tetrahydrocannabinol POSITIVE (A) NONE DETECTED   Barbiturates NONE DETECTED NONE DETECTED  Pregnancy, urine     Status: None   Collection Time: 06/17/22  2:46 AM  Result Value Ref Range   Preg Test, Ur NEGATIVE NEGATIVE  Resp Panel by RT-PCR (Flu A&B, Covid) Anterior Nasal Swab     Status: None   Collection Time: 06/17/22  4:26 AM   Specimen: Anterior Nasal Swab  Result Value Ref Range   SARS Coronavirus 2 by RT PCR NEGATIVE NEGATIVE   Influenza A by PCR NEGATIVE NEGATIVE   Influenza B by PCR NEGATIVE NEGATIVE  D-dimer, quantitative     Status: Abnormal   Collection Time: 06/17/22  9:31 AM  Result Value Ref Range   D-Dimer, Quant 0.66 (H) 0.00 - 0.50 ug/mL-FEU  Troponin I (High Sensitivity)     Status: None   Collection Time: 06/17/22 10:49 AM  Result Value Ref Range   Troponin I (High Sensitivity) 8 <18 ng/L  Brain natriuretic peptide     Status: None   Collection Time: 06/17/22 10:49 AM  Result Value Ref Range   B Natriuretic Peptide 20.1 0.0 - 100.0 pg/mL   CXR: Low lung volumes. Small opacity at the right lung apex, which may reflect atelectasis or infiltrate. Repeat inspiratory PA and lateral radiographs of the chest are suggested to see if this finding persists.  CTA Chest  There is no evidence of pulmonary artery embolism. There is no evidence of  thoracic aortic dissection. There is ectasia of main pulmonary artery suggesting possible pulmonary arterial hypertension. There are slightly enlarged lymph nodes in mediastinum and both hilar regions, possibly suggesting reactive hyperplasia of lymph nodes. There is no focal  pulmonary consolidation. There is no pleural effusion. Fatty liver.  Enlarged spleen.  EKG: Sinus tach, no st elevations  Assessment and Plan: EtOH abuse EtOH withdrawal     - admit to inpt, SDU     - CIWA protocol, fluids, thiamine, MVI, folate     - counsel against further use  Marijuana abuse     - counsel against further use  Elevated d-dimer     - CTA chest negative     - check venous dopplers  Depression Anxiety     - continue home regimen when confirmed  Elevated LFTs    - check Hep panel  Advance Care Planning:   Code Status: FULL  Consults: None  Family Communication: None at bedside  Severity of Illness: The appropriate patient status for this patient is INPATIENT. Inpatient status is judged to be reasonable and necessary in order to provide the required intensity of service to ensure the patient's safety. The patient's presenting symptoms, physical exam findings, and initial radiographic and laboratory data in the context of their chronic comorbidities is felt to place them at high risk for further clinical deterioration. Furthermore, it is not anticipated that the patient will be medically stable for discharge from the hospital within 2 midnights of admission.   * I certify that at the point of admission it is my clinical judgment that the patient will require inpatient hospital care spanning beyond 2 midnights from the point of admission due to high intensity of service, high risk for further deterioration and high frequency of surveillance required.*  Author: Jonnie Finner, DO 06/17/2022 4:53 PM  For on call review [email or web address].

## 2022-06-18 ENCOUNTER — Inpatient Hospital Stay (HOSPITAL_COMMUNITY): Payer: Commercial Managed Care - HMO

## 2022-06-18 DIAGNOSIS — R609 Edema, unspecified: Secondary | ICD-10-CM | POA: Diagnosis not present

## 2022-06-18 DIAGNOSIS — F418 Other specified anxiety disorders: Secondary | ICD-10-CM | POA: Diagnosis not present

## 2022-06-18 DIAGNOSIS — R7989 Other specified abnormal findings of blood chemistry: Secondary | ICD-10-CM | POA: Diagnosis not present

## 2022-06-18 DIAGNOSIS — Z789 Other specified health status: Secondary | ICD-10-CM | POA: Diagnosis not present

## 2022-06-18 DIAGNOSIS — F10930 Alcohol use, unspecified with withdrawal, uncomplicated: Secondary | ICD-10-CM | POA: Diagnosis not present

## 2022-06-18 DIAGNOSIS — F1210 Cannabis abuse, uncomplicated: Secondary | ICD-10-CM

## 2022-06-18 LAB — CBC
HCT: 35.3 % — ABNORMAL LOW (ref 36.0–46.0)
Hemoglobin: 11.8 g/dL — ABNORMAL LOW (ref 12.0–15.0)
MCH: 31.1 pg (ref 26.0–34.0)
MCHC: 33.4 g/dL (ref 30.0–36.0)
MCV: 92.9 fL (ref 80.0–100.0)
Platelets: 48 10*3/uL — ABNORMAL LOW (ref 150–400)
RBC: 3.8 MIL/uL — ABNORMAL LOW (ref 3.87–5.11)
RDW: 13.9 % (ref 11.5–15.5)
WBC: 2.7 10*3/uL — ABNORMAL LOW (ref 4.0–10.5)
nRBC: 0 % (ref 0.0–0.2)

## 2022-06-18 LAB — HEPATITIS PANEL, ACUTE
HCV Ab: NONREACTIVE
Hep A IgM: NONREACTIVE
Hep B C IgM: NONREACTIVE
Hepatitis B Surface Ag: NONREACTIVE

## 2022-06-18 LAB — COMPREHENSIVE METABOLIC PANEL
ALT: 49 U/L — ABNORMAL HIGH (ref 0–44)
AST: 113 U/L — ABNORMAL HIGH (ref 15–41)
Albumin: 3.4 g/dL — ABNORMAL LOW (ref 3.5–5.0)
Alkaline Phosphatase: 54 U/L (ref 38–126)
Anion gap: 9 (ref 5–15)
BUN: <5 mg/dL — ABNORMAL LOW (ref 6–20)
CO2: 31 mmol/L (ref 22–32)
Calcium: 8.1 mg/dL — ABNORMAL LOW (ref 8.9–10.3)
Chloride: 92 mmol/L — ABNORMAL LOW (ref 98–111)
Creatinine, Ser: 0.49 mg/dL (ref 0.44–1.00)
GFR, Estimated: >60 mL/min (ref >60)
Glucose, Bld: 91 mg/dL (ref 70–99)
Potassium: 2.9 mmol/L — ABNORMAL LOW (ref 3.5–5.1)
Sodium: 132 mmol/L — ABNORMAL LOW (ref 135–145)
Total Bilirubin: 2.1 mg/dL — ABNORMAL HIGH (ref 0.3–1.2)
Total Protein: 6.6 g/dL (ref 6.5–8.1)

## 2022-06-18 LAB — ETHANOL: Alcohol, Ethyl (B): <10 mg/dL (ref <10)

## 2022-06-18 LAB — MAGNESIUM: Magnesium: 1.3 mg/dL — ABNORMAL LOW (ref 1.7–2.4)

## 2022-06-18 MED ORDER — MAGNESIUM SULFATE 4 GM/100ML IV SOLN
4.0000 g | Freq: Once | INTRAVENOUS | Status: AC
  Administered 2022-06-18: 4 g via INTRAVENOUS
  Filled 2022-06-18: qty 100

## 2022-06-18 MED ORDER — ADULT MULTIVITAMIN W/MINERALS CH
1.0000 | ORAL_TABLET | Freq: Every day | ORAL | Status: DC
  Administered 2022-06-19 – 2022-06-20 (×2): 1 via ORAL
  Filled 2022-06-18 (×3): qty 1

## 2022-06-18 MED ORDER — POTASSIUM CHLORIDE CRYS ER 20 MEQ PO TBCR
30.0000 meq | EXTENDED_RELEASE_TABLET | ORAL | Status: AC
  Administered 2022-06-18 (×3): 30 meq via ORAL
  Filled 2022-06-18 (×3): qty 1

## 2022-06-18 MED ORDER — FOLIC ACID 1 MG PO TABS
1.0000 mg | ORAL_TABLET | Freq: Every day | ORAL | Status: DC
  Administered 2022-06-19 – 2022-06-20 (×2): 1 mg via ORAL
  Filled 2022-06-18 (×3): qty 1

## 2022-06-18 NOTE — Progress Notes (Signed)
Bilateral lower extremity venous duplex has been completed. Preliminary results can be found in CV Proc through chart review.   06/18/22 9:28 AM Carlos Levering RVT

## 2022-06-18 NOTE — Progress Notes (Addendum)
PROGRESS NOTE    Cynthia Salazar  IRC:[id number] DOB: [birth year] DOA: 06/17/2022 PCP: Bradd Canary, MD    Brief Narrative:   Cynthia Salazar is a 31 y.o. female with past medical history significant for depression, asthma, chronic THC abuse, EtOH abuse who presented to [facility] on 10/15 with complaints of nausea, vomiting with concerns for acute alcohol withdrawal.  Patient reports she drinks at least a pint of liquor a day and started feeling shaky the day prior to admission.  She initially went to the ED and was sent home with Librium.  Patient reports that her symptoms became worse and she sought further attention in the ED once again.  In the ED, temperature 98.4 F, HR 127, RR 18, BP 137/96, SPO2 97% on room air.  WBC 6.5, hemoglobin 14.1, platelets 105.  Sodium 138, potassium 3.7, chloride 95, CO2 27, glucose 126, BUN 5, creatinine 0.70.  AST 90, ALT 43, total bilirubin 1.0.  High sensitive troponin 8.  BNP 20.1.  Pregnancy hCG negative.  Influenza A/B PCR negative.  COVID-19 PCR negative.  EtOH level 442.  D-dimer elevated 0.66.  UDS positive for THC.  Chest x-ray with small opacity right lung apex reflective of atelectasis versus infiltrate.  CT angiogram chest negative for pulmonary embolism, no evidence of thoracic aortic dissection, noted ectasia of main pulmonary artery suggestive of possible pulmonary hypertension, no focal pulmonary consolidation and no pleural effusion, noted fatty liver and enlarged spleen.  Patient received 260 mg of IV phenobarbital and started on CIWA protocol.  TRH was consulted for admission and patient was transferred to [facility] for further evaluation and treatment of acute alcohol withdrawal, acute alcohol intoxication.  Assessment & Plan:   EtOH withdrawal/intoxication Patient presenting to the ED with nausea/vomiting with concerns for acute alcohol withdrawal.  Patient endorses 1 pint of liquor a day.  EtOH level elevated at 442  admission, with notable AST/ALT elevation consistent with alcohol abuse.  Received IV phenobarbital prior to transfer from med center to [facility] by EDP. --EtOH 442>, <10 --CIWA protocol with symptom triggered Ativan --Thiamine, folic acid, multivitamin --Supportive care, antiemetics --TOC for substance abuse resources  Hypokalemia Potassium 2.9, will replete. -- Repeat electrolytes in a.m.  Hypomagnesemia Magnesium 1.3, will replete. --Repeat magnesium level in a.m.  Elevated D-dimer D-dimer elevated 0.66.  CT angiogram chest negative for pulmonary embolism.  Bilateral venous duplex ultrasound lower extremities negative for DVT.  Transaminitis AST 90, ALT 43 on admission, consistent with alcohol abuse.  Total bilirubin within normal limits.  Acute hepatitis panel negative.  Right upper quadrant ultrasound with fatty liver, no acute findings. -- Repeat CMP in the a.m.  Thrombocytopenia Etiology likely secondary to chronic EtOH abuse.  Avoiding antiplatelet/anticoagulant. --Plt 105>48 --repeat CBC in am  THC abuse, chronic UDS positive for THC.  Counseled on need for complete cessation.  DVT prophylaxis: Place and maintain sequential compression device Start: 06/18/22 0831    Code Status: Full Code Family Communication: No family present at bedside this morning  Disposition Plan:  Level of care: Progressive Status is: Inpatient Remains inpatient appropriate because: Continue IV fluid hydration, CIWA protocol for active EtOH withdrawal, continues to be tachycardic both at rest with increased heart rate on minimal exertion.    Consultants:  None  Procedures:  Right upper quadrant ultrasound Vascular duplex ultrasound bilateral lower extremities  Antimicrobials:  None   Subjective: Patient seen and examined at bedside, resting comfortably.  Lying in bed.  Slightly anxious.  RN present.  Continues to be tachycardic at rest, with increased heart rate with  minimal exertion, noted on ambulation to bathroom this morning.  Has not received any further IV Ativan this morning, stable for transfer to progressive unit.  No other specific questions or concerns at this time.  Discussed need for complete alcohol cessation.  Patient requesting assistance with substance abuse and have consulted TOC for assistance in evaluation.  Denies headache, no dizziness, no chest pain, no shortness of breath, no abdominal pain, no fever/chills/night sweats, no current vomiting/diarrhea, no abdominal pain, no focal weakness, no fatigue, no paresthesias.  No acute events overnight per nursing staff.  Objective: Vitals:   06/18/22 0332 06/18/22 0400 06/18/22 0742 06/18/22 0800  BP:  (!) 135/100  (!) 151/101  Pulse:  (!) 127  (!) 104  Resp:      Temp: 99.1 F (37.3 C)  100.2 F (37.9 C)   TempSrc: Oral  Oral   SpO2:    94%  Weight:      Height:        Intake/Output Summary (Last 24 hours) at 06/18/2022 0926 Last data filed at 06/17/2022 1900 Gross per 24 hour  Intake 1877.14 ml  Output --  Net 1877.14 ml   Filed Weights   06/17/22 0052 06/17/22 1640  Weight: 68 kg 73.1 kg    Examination:  Physical Exam: GEN: NAD, alert and oriented x 3, chronically ill appearance, appears older than stated age HEENT: NCAT, PERRL, EOMI, sclera clear, MMM, on room air PULM: CTAB w/o wheezes/crackles, normal respiratory effort CV: Tachycardic, regular rhythm w/o M/G/R GI: abd soft, NTND, NABS, no R/G/M MSK: no peripheral edema, muscle strength globally intact 5/5 bilateral upper/lower extremities NEURO: CN II-XII intact, no focal deficits, sensation to light touch intact PSYCH: Depressed mood, flat affect Integumentary: dry/intact, no rashes or wounds    Data Reviewed: I have personally reviewed following labs and imaging studies  CBC: Recent Labs  Lab 06/16/22 0211 06/17/22 0102 06/17/22 1717 06/18/22 0255  WBC 10.1 6.5 4.1 2.7*  NEUTROABS  --   --  3.4  --    HGB 14.4 14.1 11.6* 11.8*  HCT 40.3 40.8 33.6* 35.3*  MCV 88.4 88.9 92.8 92.9  PLT 146* 105* 54* 48*   Basic Metabolic Panel: Recent Labs  Lab 06/16/22 0211 06/17/22 0102 06/17/22 1717 06/18/22 0255 06/18/22 0714  NA 133* 138 133* 132*  --   K 3.8 3.7 3.1* 2.9*  --   CL 91* 95* 94* 92*  --   CO2 18* 27 27 31   --   GLUCOSE 125* 126* 106* 91  --   BUN 9 5* <5* <5*  --   CREATININE 0.59 0.70 0.49 0.49  --   CALCIUM 9.2 9.3 8.0* 8.1*  --   MG  --   --   --   --  1.3*   GFR: Estimated Creatinine Clearance: 94.5 mL/min (by C-G formula based on SCr of 0.49 mg/dL). Liver Function Tests: Recent Labs  Lab 06/16/22 0211 06/17/22 0102 06/17/22 1717 06/18/22 0255  AST 55* 90* 128* 113*  ALT 33 43 54* 49*  ALKPHOS 62 64 57 54  BILITOT 1.7* 1.0 1.7* 2.1*  PROT 8.7* 8.8* 6.8 6.6  ALBUMIN 4.8 4.8 3.7 3.4*   No results for input(s): "LIPASE", "AMYLASE" in the last 168 hours. No results for input(s): "AMMONIA" in the last 168 hours. Coagulation Profile: No results for input(s): "INR", "PROTIME" in the last 168 hours. Cardiac Enzymes:  No results for input(s): "CKTOTAL", "CKMB", "CKMBINDEX", "TROPONINI" in the last 168 hours. BNP (last 3 results) No results for input(s): "PROBNP" in the last 8760 hours. HbA1C: No results for input(s): "HGBA1C" in the last 72 hours. CBG: No results for input(s): "GLUCAP" in the last 168 hours. Lipid Profile: No results for input(s): "CHOL", "HDL", "LDLCALC", "TRIG", "CHOLHDL", "LDLDIRECT" in the last 72 hours. Thyroid Function Tests: No results for input(s): "TSH", "T4TOTAL", "FREET4", "T3FREE", "THYROIDAB" in the last 72 hours. Anemia Panel: No results for input(s): "VITAMINB12", "FOLATE", "FERRITIN", "TIBC", "IRON", "RETICCTPCT" in the last 72 hours. Sepsis Labs: No results for input(s): "PROCALCITON", "LATICACIDVEN" in the last 168 hours.  Recent Results (from the past 240 hour(s))  Resp Panel by RT-PCR (Flu A&B, Covid) Anterior Nasal Swab      Status: None   Collection Time: 06/17/22  4:26 AM   Specimen: Anterior Nasal Swab  Result Value Ref Range Status   SARS Coronavirus 2 by RT PCR NEGATIVE NEGATIVE Final    Comment: (NOTE) SARS-CoV-2 target nucleic acids are NOT DETECTED.  The SARS-CoV-2 RNA is generally detectable in upper respiratory specimens during the acute phase of infection. The lowest concentration of SARS-CoV-2 viral copies this assay can detect is 138 copies/mL. A negative result does not preclude SARS-Cov-2 infection and should not be used as the sole basis for treatment or other patient management decisions. A negative result may occur with  improper specimen collection/handling, submission of specimen other than nasopharyngeal swab, presence of viral mutation(s) within the areas targeted by this assay, and inadequate number of viral copies(<138 copies/mL). A negative result must be combined with clinical observations, patient history, and epidemiological information. The expected result is Negative.  Fact Sheet for Patients:  EntrepreneurPulse.com.au  Fact Sheet for Healthcare Providers:  IncredibleEmployment.be  This test is no t yet approved or cleared by the [place] FDA and  has been authorized for detection and/or diagnosis of SARS-CoV-2 by FDA under an Emergency Use Authorization (EUA). This EUA will remain  in effect (meaning this test can be used) for the duration of the COVID-19 declaration under Section 564(b)(1) of the Act, 21 U.S.C.section 360bbb-3(b)(1), unless the authorization is terminated  or revoked sooner.       Influenza A by PCR NEGATIVE NEGATIVE Final   Influenza B by PCR NEGATIVE NEGATIVE Final    Comment: (NOTE) The Xpert Xpress SARS-CoV-2/FLU/RSV plus assay is intended as an aid in the diagnosis of influenza from Nasopharyngeal swab specimens and should not be used as a sole basis for treatment. Nasal washings and aspirates are  unacceptable for Xpert Xpress SARS-CoV-2/FLU/RSV testing.  Fact Sheet for Patients: EntrepreneurPulse.com.au  Fact Sheet for Healthcare Providers: IncredibleEmployment.be  This test is not yet approved or cleared by the [place] FDA and has been authorized for detection and/or diagnosis of SARS-CoV-2 by FDA under an Emergency Use Authorization (EUA). This EUA will remain in effect (meaning this test can be used) for the duration of the COVID-19 declaration under Section 564(b)(1) of the Act, 21 U.S.C. section 360bbb-3(b)(1), unless the authorization is terminated or revoked.  Performed at [facility], [address] [place], [place] [address]   MRSA Next Gen by PCR, Nasal     Status: None   Collection Time: 06/17/22  4:47 PM   Specimen: Nasal Mucosa; Nasal Swab  Result Value Ref Range Status   MRSA by PCR Next Gen NOT DETECTED NOT DETECTED Final    Comment: (NOTE) The GeneXpert MRSA Assay (FDA approved  for NASAL specimens only), is one component of a comprehensive MRSA colonization surveillance program. It is not intended to diagnose MRSA infection nor to guide or monitor treatment for MRSA infections. Test performance is not FDA approved in patients less than 38 years old. Performed at [facility], [address] [place], [place] [address]          Radiology Studies: US Abdomen Limited RUQ (LIVER/GB)  Result Date: 06/17/2022 CLINICAL DATA:  Elevated LFTs EXAM: ULTRASOUND ABDOMEN LIMITED RIGHT UPPER QUADRANT COMPARISON:  None Available. FINDINGS: Gallbladder: No gallstones or wall thickening visualized. No sonographic Murphy sign noted by sonographer. Common bile duct: Diameter: Normal caliber, 3-4 mm Liver: Diffusely increased echotexture throughout the liver. No focal hepatic abnormality. Portal vein is patent on color Doppler imaging with normal direction of blood flow towards the liver.  Other: None. IMPRESSION: Fatty liver. No acute findings. Electronically Signed   By: Charlett Nose M.D.   On: 06/17/2022 20:28   CT Angio Chest PE W and/or Wo Contrast  Result Date: 06/17/2022 CLINICAL DATA:  Positive D-dimer, shortness of breath EXAM: CT ANGIOGRAPHY CHEST WITH CONTRAST TECHNIQUE: Multidetector CT imaging of the chest was performed using the standard protocol during bolus administration of intravenous contrast. Multiplanar CT image reconstructions and MIPs were obtained to evaluate the vascular anatomy. RADIATION DOSE REDUCTION: This exam was performed according to the departmental dose-optimization program which includes automated exposure control, adjustment of the mA and/or kV according to patient size and/or use of iterative reconstruction technique. CONTRAST:  OMNIPAQUE IOHEXOL 350 MG/ML SOLN COMPARISON:  Chest radiographs done earlier today FINDINGS: Cardiovascular: There is homogeneous enhancement in thoracic aorta. There is mild ectasia of ascending thoracic aorta measuring 3.2 cm. There is ectasia of main pulmonary artery measuring 3.7 cm. There are no intraluminal filling defects in pulmonary artery branches. Mediastinum/Nodes: There are there are enlarged lymph nodes in mediastinum in both hilar regions. Largest of the nodes is seen in the right side of superior mediastinum measuring 13 mm in short axis. There are slightly enlarged lymph nodes in both hilar regions, more so on the left side. Lungs/Pleura: No focal pulmonary infiltrates are seen. There is no pleural effusion or pneumothorax. Upper Abdomen: There is fatty infiltration in liver. Spleen measures 13.4 cm in maximum diameter. There is subtle increase in density in the lumen of gallbladder without demonstrable discrete calculi. This finding may suggest presence of sludge in gallbladder. There is no wall thickening in the gallbladder. There is no fluid around the gallbladder. Musculoskeletal: No acute findings are seen.  Review of the MIP images confirms the above findings. IMPRESSION: There is no evidence of pulmonary artery embolism. There is no evidence of thoracic aortic dissection. There is ectasia of main pulmonary artery suggesting possible pulmonary arterial hypertension. There are slightly enlarged lymph nodes in mediastinum and both hilar regions, possibly suggesting reactive hyperplasia of lymph nodes. There is no focal pulmonary consolidation. There is no pleural effusion. Fatty liver.  Enlarged spleen. Electronically Signed   By: Ernie Avena M.D.   On: 06/17/2022 11:00   DG Chest Portable 1 View  Result Date: 06/17/2022 CLINICAL DATA:  Shortness of breath EXAM: PORTABLE CHEST 1 VIEW COMPARISON:  04/30/2021 FINDINGS: The heart size and mediastinal contours are within normal limits. Low lung volumes. Crowding of the central bronchovascular markings. Small opacity at the medial aspect of the right lung apex. No pleural effusion or pneumothorax. The visualized skeletal structures are unremarkable. IMPRESSION: Low lung volumes. Small opacity  at the right lung apex, which may reflect atelectasis or infiltrate. Repeat inspiratory PA and lateral radiographs of the chest are suggested to see if this finding persists. Electronically Signed   By: Duanne GuessNicholas  Plundo D.O.   On: 06/17/2022 09:37        Scheduled Meds:  Chlorhexidine Gluconate Cloth  6 each Topical Daily   LORazepam  0-4 mg Intravenous Q6H   Or   LORazepam  0-4 mg Oral Q6H   [START ON 06/19/2022] LORazepam  0-4 mg Intravenous Q12H   Or   [START ON 06/19/2022] LORazepam  0-4 mg Oral Q12H   potassium chloride  30 mEq Oral Q3H   thiamine  100 mg Oral Daily   Or   thiamine  100 mg Intravenous Daily   Continuous Infusions:  lactated ringers 150 mL/hr at 06/18/22 0909     LOS: 1 day    Time spent: 51 minutes spent on chart review, discussion with nursing staff, consultants, updating family and interview/physical exam; more than 50% of  that time was spent in counseling and/or coordination of care.    Alvira PhilipsEric J [place], DO [facility] Available via Epic secure chat 7am-7pm After these hours, please refer to coverage provider listed on [email or web address] 06/18/2022, 9:26 AM

## 2022-06-19 DIAGNOSIS — F10930 Alcohol use, unspecified with withdrawal, uncomplicated: Secondary | ICD-10-CM | POA: Diagnosis not present

## 2022-06-19 DIAGNOSIS — R7989 Other specified abnormal findings of blood chemistry: Secondary | ICD-10-CM | POA: Diagnosis not present

## 2022-06-19 DIAGNOSIS — Z789 Other specified health status: Secondary | ICD-10-CM | POA: Diagnosis not present

## 2022-06-19 DIAGNOSIS — F418 Other specified anxiety disorders: Secondary | ICD-10-CM | POA: Diagnosis not present

## 2022-06-19 LAB — MAGNESIUM: Magnesium: 1.9 mg/dL (ref 1.7–2.4)

## 2022-06-19 LAB — CBC
HCT: 34.4 % — ABNORMAL LOW (ref 36.0–46.0)
Hemoglobin: 11.7 g/dL — ABNORMAL LOW (ref 12.0–15.0)
MCH: 31.5 pg (ref 26.0–34.0)
MCHC: 34 g/dL (ref 30.0–36.0)
MCV: 92.5 fL (ref 80.0–100.0)
Platelets: 46 10*3/uL — ABNORMAL LOW (ref 150–400)
RBC: 3.72 MIL/uL — ABNORMAL LOW (ref 3.87–5.11)
RDW: 13.7 % (ref 11.5–15.5)
WBC: 3.5 10*3/uL — ABNORMAL LOW (ref 4.0–10.5)
nRBC: 0 % (ref 0.0–0.2)

## 2022-06-19 LAB — COMPREHENSIVE METABOLIC PANEL
ALT: 47 U/L — ABNORMAL HIGH (ref 0–44)
AST: 99 U/L — ABNORMAL HIGH (ref 15–41)
Albumin: 3.2 g/dL — ABNORMAL LOW (ref 3.5–5.0)
Alkaline Phosphatase: 48 U/L (ref 38–126)
Anion gap: 8 (ref 5–15)
BUN: <5 mg/dL — ABNORMAL LOW (ref 6–20)
CO2: 28 mmol/L (ref 22–32)
Calcium: 8.5 mg/dL — ABNORMAL LOW (ref 8.9–10.3)
Chloride: 99 mmol/L (ref 98–111)
Creatinine, Ser: 0.48 mg/dL (ref 0.44–1.00)
GFR, Estimated: >60 mL/min (ref >60)
Glucose, Bld: 95 mg/dL (ref 70–99)
Potassium: 3.2 mmol/L — ABNORMAL LOW (ref 3.5–5.1)
Sodium: 135 mmol/L (ref 135–145)
Total Bilirubin: 1.8 mg/dL — ABNORMAL HIGH (ref 0.3–1.2)
Total Protein: 6.4 g/dL — ABNORMAL LOW (ref 6.5–8.1)

## 2022-06-19 MED ORDER — POTASSIUM CHLORIDE CRYS ER 20 MEQ PO TBCR
40.0000 meq | EXTENDED_RELEASE_TABLET | ORAL | Status: AC
  Administered 2022-06-19 (×2): 40 meq via ORAL
  Filled 2022-06-19 (×2): qty 2

## 2022-06-19 MED ORDER — MAGNESIUM SULFATE 2 GM/50ML IV SOLN
2.0000 g | Freq: Once | INTRAVENOUS | Status: AC
  Administered 2022-06-19: 2 g via INTRAVENOUS
  Filled 2022-06-19: qty 50

## 2022-06-19 NOTE — Consult Note (Signed)
[facility] Face-to-Face Psychiatry Consult   Reason for Consult:   Referring Physician:   Patient Identification: Cynthia Salazar MRN:  [id number] Principal Diagnosis: Alcohol withdrawal (HCC) Diagnosis:  Principal Problem:   Alcohol withdrawal (HCC) Active Problems:   Depression with anxiety   Elevated LFTs   Alcohol use (HCC)   Elevated d-dimer   Marijuana abuse   Total Time spent with patient: 45 minutes  Subjective:   [name] is a 31 y.o. female patient admitted with alcohol withdrawal.  In regards to her alcohol history she reports drinking 1 pint to 1/5 of liquor per day sometimes more.  She reports her alcohol intake varies based off occasions and stressors.  She also reports some intermittent use of marijuana, last smoking about 3 days ago.  She reports her longest period of sobriety 10 months, after being discharged from a halfway house to a long-term rehabilitation in [place].  She reports dues to behavior of someone else sending provocative pictures, that resulted in ultimate discharge from the program.  She denies any complications from alcohol use at this time to include seizures, delirium tremens, alcohol induced psychosis.  Patient further denies any previous history of suicide by drinking.  She does report a history of self-harm that started at the age of 81, last cut when she was a teenager.  She denies any recent inpatient psychiatric admission.  She reports seeking help from [facility], however due to elevated blood pressure readings she was transferred to the emergency department who later subsequently discharged her home.  Patient has been seeking inpatient substance abuse services majority of 2023 with no success.  Patient states I am doing better, just anxious.  She identifies her mood as anxious, and her affect is congruent.  At this present time she denies any physical symptoms of acute alcohol withdrawal to include irritability,  nausea or vomiting, diarrhea.  She does endorse fine tremors bilateral hands.  She denies any auditory, visual, tactile hallucinations at this time.  She does appear to have some volition and motivation to initiate changes in her future, and to quit drinking at this time.  Patient was able to successfully contact inpatient substance abuse facility, was approved with tentative date for acceptance of tomorrow.  Patient tells me she has to discharge from the hospital no later than 12 PM in order to make her intake appointment at her new facility called [facility].  Patient reports she was advised by MD today of high risk complications if she continues to drink alcohol.  She states she ancipitates the medical team releasing her tomorrow.    We did review treatment options to include antabuse, gabapentin and naltrexone, however with upcoming discharge tomorrow less than 24 hours away will not initiate new medication at this time.  She reports she has a recent diagnosis of depression, however has not been compliant with her Lexapro.  Patient denies any history or current symptoms of depression.   Patient specifically denies any suicidal ideation, plan, or intent.  She denies ever drinking alcohol as an attempt to end his life.  She denies any homicidal ideation.    HPI:  Cynthia Salazar is a 31 y.o. female with past medical history significant for depression, asthma, chronic THC abuse, EtOH abuse who presented to [facility] on 10/15 with complaints of nausea, vomiting with concerns for acute alcohol withdrawal.  Patient reports she drinks at least a pint of liquor a day and started feeling shaky the day  prior to admission.  She initially went to the ED and was sent home with Librium.  Patient reports that her symptoms became worse and she sought further attention in the ED once again.   In the ED, temperature 98.4 F, HR 127, RR 18, BP 137/96, SPO2 97% on room air.  WBC 6.5, hemoglobin 14.1,  platelets 105.  Sodium 138, potassium 3.7, chloride 95, CO2 27, glucose 126, BUN 5, creatinine 0.70.  AST 90, ALT 43, total bilirubin 1.0.  High sensitive troponin 8.  BNP 20.1.  Pregnancy hCG negative.  Influenza A/B PCR negative.  COVID-19 PCR negative.  EtOH level 442.  D-dimer elevated 0.66.  UDS positive for THC.  Chest x-ray with small opacity right lung apex reflective of atelectasis versus infiltrate.  CT angiogram chest negative for pulmonary embolism, no evidence of thoracic aortic dissection, noted ectasia of main pulmonary artery suggestive of possible pulmonary hypertension, no focal pulmonary consolidation and no pleural effusion, noted fatty liver and enlarged spleen.  Patient received 260 mg of IV phenobarbital and started on CIWA protocol.  TRH was consulted for admission and patient was transferred to [facility] for further evaluation and treatment of acute alcohol withdrawal, acute alcohol intoxication.    Past Psychiatric History: History of depression, severe alcohol use disorder. Denies any previous history of suicidal thoughts, suicidal ideations.  She does report isolated history of  non suicidal self injurious behaviors. Pt denies history of aggression, agitation, violent behavior, and or history of homicidal ideations/thoughts.  Patient further denies any current, previous legal charges.  Patient further denies access to guns, weapons, or any engagement with the legal system.  She does endorse intermittent use of marijuana, and daily use of alcohol up to 1/5 of liquor per day.  Patient denies history of illicit substances to include synthetic substances and supplemental herbs.    Risk to Self:  Denies Risk to Others:   Denies Prior Inpatient Therapy:   None recently Prior Outpatient Therapy: None at this time  Past Medical History:  Past Medical History:  Diagnosis Date   Abnormal liver function test 03/18/2013   Alcohol use 06/03/2015   Allergy    Asthma     Chicken pox as a child   Depression with anxiety    Essential hypertension 06/03/2015   Insomnia 03/17/2013   Other and unspecified hyperlipidemia 03/18/2013    Past Surgical History:  Procedure Laterality Date   WISDOM TOOTH EXTRACTION  19 yrs   Family History:  Family History  Problem Relation Age of Onset   Nephrolithiasis Father    Hypertension Father    Nephrolithiasis Brother    COPD Paternal Grandmother    Family Psychiatric  History: Denies Social History:  Social History   Substance and Sexual Activity  Alcohol Use Yes   Comment: 1 pint per day      Social History   Substance and Sexual Activity  Drug Use Yes   Types: Marijuana    Social History   Socioeconomic History   Marital status: Single    Spouse name: Not on file   Number of children: Not on file   Years of education: Not on file   Highest education level: Not on file  Occupational History   Not on file  Tobacco Use   Smoking status: Former   Smokeless tobacco: Never   Tobacco comments:    occasionally smoked one  Vaping Use   Vaping Use: Never used  Substance and Sexual Activity  Alcohol use: Yes    Comment: 1 pint per day    Drug use: Yes    Types: Marijuana   Sexual activity: Yes    Partners: Female  Other Topics Concern   Not on file  Social History Narrative   Not on file   Social Determinants of Health   Financial Resource Strain: Not on file  Food Insecurity: Not on file  Transportation Needs: Not on file  Physical Activity: Not on file  Stress: Not on file  Social Connections: Not on file   Additional Social History:    Allergies:   Allergies  Allergen Reactions   Aleve [Naproxen Sodium]     wheeze    Labs:  Results for orders placed or performed during the hospital encounter of 06/17/22 (from the past 48 hour(s))  Comprehensive metabolic panel     Status: Abnormal   Collection Time: 06/17/22  5:17 PM  Result Value Ref Range   Sodium 133 (L) 135 - 145  mmol/L   Potassium 3.1 (L) 3.5 - 5.1 mmol/L   Chloride 94 (L) 98 - 111 mmol/L   CO2 27 22 - 32 mmol/L   Glucose, Bld 106 (H) 70 - 99 mg/dL    Comment: Glucose reference range applies only to samples taken after fasting for at least 8 hours.   BUN <5 (L) 6 - 20 mg/dL   Creatinine, Ser 0.27 0.44 - 1.00 mg/dL   Calcium 8.0 (L) 8.9 - 10.3 mg/dL   Total Protein 6.8 6.5 - 8.1 g/dL   Albumin 3.7 3.5 - 5.0 g/dL   AST 253 (H) 15 - 41 U/L   ALT 54 (H) 0 - 44 U/L   Alkaline Phosphatase 57 38 - 126 U/L   Total Bilirubin 1.7 (H) 0.3 - 1.2 mg/dL   GFR, Estimated >66 >44 mL/min    Comment: (NOTE) Calculated using the CKD-EPI Creatinine Equation (2021)    Anion gap 12 5 - 15    Comment: Performed at [facility], [address], [place], [place] [address]  CBC with Differential/Platelet     Status: Abnormal   Collection Time: 06/17/22  5:17 PM  Result Value Ref Range   WBC 4.1 4.0 - 10.5 K/uL   RBC 3.62 (L) 3.87 - 5.11 MIL/uL   Hemoglobin 11.6 (L) 12.0 - 15.0 g/dL   HCT 25.9 (L) 56.3 - 87.5 %   MCV 92.8 80.0 - 100.0 fL   MCH 32.0 26.0 - 34.0 pg   MCHC 34.5 30.0 - 36.0 g/dL   RDW 64.3 32.9 - 51.8 %   Platelets 54 (L) 150 - 400 K/uL    Comment: SPECIMEN CHECKED FOR CLOTS Immature Platelet Fraction may be clinically indicated, consider ordering this additional test [id number] REPEATED TO VERIFY    nRBC 0.0 0.0 - 0.2 %   Neutrophils Relative % 84 %   Neutro Abs 3.4 1.7 - 7.7 K/uL   Lymphocytes Relative 8 %   Lymphs Abs 0.3 (L) 0.7 - 4.0 K/uL   Monocytes Relative 6 %   Monocytes Absolute 0.3 0.1 - 1.0 K/uL   Eosinophils Relative 1 %   Eosinophils Absolute 0.0 0.0 - 0.5 K/uL   Basophils Relative 1 %   Basophils Absolute 0.0 0.0 - 0.1 K/uL   Immature Granulocytes 0 %   Abs Immature Granulocytes 0.01 0.00 - 0.07 K/uL    Comment: Performed at [facility], [address], [place], [place] [address]  HIV Antibody (routine  testing w rflx)     Status:  None   Collection Time: 06/17/22  6:38 PM  Result Value Ref Range   HIV Screen 4th Generation wRfx Non Reactive Non Reactive    Comment: Performed at [facility], [place] [address], [place], [place] [address]  Hepatitis panel, acute     Status: None   Collection Time: 06/17/22  6:38 PM  Result Value Ref Range   Hepatitis B Surface Ag NON REACTIVE NON REACTIVE   HCV Ab NON REACTIVE NON REACTIVE    Comment: (NOTE) Nonreactive HCV antibody screen is consistent with no HCV infections,  unless recent infection is suspected or other evidence exists to indicate HCV infection.     Hep A IgM NON REACTIVE NON REACTIVE   Hep B C IgM NON REACTIVE NON REACTIVE    Comment: Performed at [facility], [place] [address] [place], [place] [address]  Comprehensive metabolic panel     Status: Abnormal   Collection Time: 06/18/22  2:55 AM  Result Value Ref Range   Sodium 132 (L) 135 - 145 mmol/L   Potassium 2.9 (L) 3.5 - 5.1 mmol/L   Chloride 92 (L) 98 - 111 mmol/L   CO2 31 22 - 32 mmol/L   Glucose, Bld 91 70 - 99 mg/dL    Comment: Glucose reference range applies only to samples taken after fasting for at least 8 hours.   BUN <5 (L) 6 - 20 mg/dL   Creatinine, Ser 0.49 0.44 - 1.00 mg/dL   Calcium 8.1 (L) 8.9 - 10.3 mg/dL   Total Protein 6.6 6.5 - 8.1 g/dL   Albumin 3.4 (L) 3.5 - 5.0 g/dL   AST 113 (H) 15 - 41 U/L   ALT 49 (H) 0 - 44 U/L   Alkaline Phosphatase 54 38 - 126 U/L   Total Bilirubin 2.1 (H) 0.3 - 1.2 mg/dL   GFR, Estimated >60 >60 mL/min    Comment: (NOTE) Calculated using the CKD-EPI Creatinine Equation (2021)    Anion gap 9 5 - 15    Comment: Performed at [facility], [place] [address], [place], [place] [address]  CBC     Status: Abnormal   Collection Time: 06/18/22  2:55 AM  Result Value Ref Range   WBC 2.7 (L) 4.0 - 10.5 K/uL   RBC 3.80 (L) 3.87 - 5.11 MIL/uL   Hemoglobin 11.8 (L) 12.0 - 15.0 g/dL   HCT 35.3 (L) 36.0 - 46.0 %   MCV 92.9 80.0 - 100.0  fL   MCH 31.1 26.0 - 34.0 pg   MCHC 33.4 30.0 - 36.0 g/dL   RDW 13.9 11.5 - 15.5 %   Platelets 48 (L) 150 - 400 K/uL    Comment: SPECIMEN CHECKED FOR CLOTS Immature Platelet Fraction may be clinically indicated, consider ordering this additional test [id number] CONSISTENT WITH PREVIOUS RESULT REPEATED TO VERIFY    nRBC 0.0 0.0 - 0.2 %    Comment: Performed at [facility], [place] [address], [place], [place] [address]  Ethanol     Status: None   Collection Time: 06/18/22  7:14 AM  Result Value Ref Range   Alcohol, Ethyl (B) <10 <10 mg/dL    Comment: (NOTE) Lowest detectable limit for serum alcohol is 10 mg/dL.  For medical purposes only. Performed at [facility], [place] [address] [place], [place] [address]   Magnesium     Status: Abnormal   Collection Time: 06/18/22  7:14 AM  Result Value Ref Range   Magnesium 1.3 (L) 1.7 - 2.4 mg/dL    Comment: Performed at [facility], [address] [place], [place] [address]  CBC     Status: Abnormal   Collection Time: 06/19/22  3:49 AM  Result Value Ref Range   WBC 3.5 (L) 4.0 - 10.5 K/uL   RBC 3.72 (L) 3.87 - 5.11 MIL/uL   Hemoglobin 11.7 (L) 12.0 - 15.0 g/dL   HCT 87.5 (L) 64.3 - 32.9 %   MCV 92.5 80.0 - 100.0 fL   MCH 31.5 26.0 - 34.0 pg   MCHC 34.0 30.0 - 36.0 g/dL   RDW 51.8 84.1 - 66.0 %   Platelets 46 (L) 150 - 400 K/uL    Comment: SPECIMEN CHECKED FOR CLOTS Immature Platelet Fraction may be clinically indicated, consider ordering this additional test [id number] CONSISTENT WITH PREVIOUS RESULT    nRBC 0.0 0.0 - 0.2 %    Comment: Performed at [facility], [address] [place], [place] [address]  Comprehensive metabolic panel     Status: Abnormal   Collection Time: 06/19/22  3:49 AM  Result Value Ref Range   Sodium 135 135 - 145 mmol/L   Potassium 3.2 (L) 3.5 - 5.1 mmol/L   Chloride 99 98 - 111 mmol/L   CO2 28 22 - 32 mmol/L   Glucose, Bld  95 70 - 99 mg/dL    Comment: Glucose reference range applies only to samples taken after fasting for at least 8 hours.   BUN <5 (L) 6 - 20 mg/dL   Creatinine, Ser 5.73 0.44 - 1.00 mg/dL   Calcium 8.5 (L) 8.9 - 10.3 mg/dL   Total Protein 6.4 (L) 6.5 - 8.1 g/dL   Albumin 3.2 (L) 3.5 - 5.0 g/dL   AST 99 (H) 15 - 41 U/L   ALT 47 (H) 0 - 44 U/L   Alkaline Phosphatase 48 38 - 126 U/L   Total Bilirubin 1.8 (H) 0.3 - 1.2 mg/dL   GFR, Estimated >22 >02 mL/min    Comment: (NOTE) Calculated using the CKD-EPI Creatinine Equation (2021)    Anion gap 8 5 - 15    Comment: Performed at [facility], [address], [place], [place] [address]  Magnesium     Status: None   Collection Time: 06/19/22  3:49 AM  Result Value Ref Range   Magnesium 1.9 1.7 - 2.4 mg/dL    Comment: Performed at [facility], [address] [place], [place] [address]    Current Facility-Administered Medications  Medication Dose Route Frequency Provider Last Rate Last Admin   acetaminophen (TYLENOL) tablet 650 mg  650 mg Oral Q4H PRN Ronaldo Miyamoto, Tyrone A, DO       albuterol (PROVENTIL) (2.5 MG/3ML) 0.083% nebulizer solution 2.5 mg  2.5 mg Nebulization Q2H PRN Ronaldo Miyamoto, Tyrone A, DO       Chlorhexidine Gluconate Cloth 2 % PADS 6 each  6 each Topical Daily Kyle, Tyrone A, DO   6 each at 06/19/22 0844   folic acid (FOLVITE) tablet 1 mg  1 mg Oral Daily [place], Alvira Philips, DO   1 mg at 06/19/22 2831   LORazepam (ATIVAN) injection 0-4 mg  0-4 mg Intravenous Q12H Kyle, Tyrone A, DO       Or   LORazepam (ATIVAN) tablet 0-4 mg  0-4 mg Oral Q12H Kyle, Tyrone A, DO       metoprolol tartrate (  LOPRESSOR) injection 5 mg  5 mg Intravenous Q6H PRN Ronaldo MiyamotoKyle, Tyrone A, DO       multivitamin with minerals tablet 1 tablet  1 tablet Oral Daily [place], Alvira Philipsric J, DO   1 tablet at 06/19/22 0838   ondansetron (ZOFRAN) injection 4 mg  4 mg Intravenous Q6H PRN Ronaldo MiyamotoKyle, Tyrone A, DO   4 mg at 06/17/22 0417   Oral care mouth rinse  15  mL Mouth Rinse PRN Ronaldo MiyamotoKyle, Tyrone A, DO       thiamine (VITAMIN B1) tablet 100 mg  100 mg Oral Daily Kyle, Tyrone A, DO   100 mg at 06/19/22 0844    Musculoskeletal: Strength & Muscle Tone: within normal limits Gait & Station: normal Patient leans: N/A            Psychiatric Specialty Exam:  Presentation  General Appearance: Appropriate for Environment; Casual  Eye Contact:Fair  Speech:Clear and Coherent; Normal Rate  Speech Volume:Normal  Handedness:Right   Mood and Affect  Mood:Euthymic  Affect:Appropriate; Congruent   Thought Process  Thought Processes:Coherent; Linear  Descriptions of Associations:Intact  Orientation:Full (Time, Place and Person)  Thought Content:Logical  History of Schizophrenia/Schizoaffective disorder:No data recorded Duration of Psychotic Symptoms:No data recorded Hallucinations:Hallucinations: None  Ideas of Reference:None  Suicidal Thoughts:Suicidal Thoughts: No  Homicidal Thoughts:Homicidal Thoughts: No   Sensorium  Memory:Immediate Fair; Recent Fair; Remote Fair  Judgment:Fair  Insight:Fair   Executive Functions  Concentration:Fair  Attention Span:Fair  Recall:Fair  Fund of Knowledge:Fair  Language:Fair   Psychomotor Activity  Psychomotor Activity:Psychomotor Activity: Normal   Assets  Assets:Communication Skills; Desire for Improvement; Financial Resources/Insurance; Physical Health; Resilience   Sleep  Sleep:Sleep: Fair   Physical Exam: Physical Exam ROS Blood pressure (!) 143/106, pulse (!) 108, temperature 98 F (36.7 C), resp. rate 20, height 5\' 2"  (1.575 m), weight 73.1 kg, SpO2 100 %. Body mass index is 29.48 kg/m.  Treatment Plan Summary: Plan continue current CIWA and alcohol detox protocols. -No current recommendations.  Patient has been accepted to inpatient substance abuse facility, needed psychiatric clearance. -Highly recommend the patient pursue inpatient substance abuse  services, considering multiple failed attempts of alcohol detox and cessation.  Patient has multiple emergency room visits for alcohol abuse, has underlying depression and would benefit from treatment of both at this time. -Patient has been accepted to [place] inpatient substance abuse facility [place], a septums tomorrow.  Patient plans to discharge before noon, mother will pick her up to transport her to her facility. -Continue to monitor closely for alcohol withdrawal symptoms, as today is day 3 since her last drink.  Psychiatric consult service to sign off at this time. Disposition: No evidence of imminent risk to self or others at present.   Patient does not meet criteria for psychiatric inpatient admission. Supportive therapy provided about ongoing stressors. Discussed crisis plan, support from social network, calling 911, coming to the Emergency Department, and calling Suicide Hotline.  Maryagnes Amosakia S Starkes-Perry, FNP 06/19/2022 5:09 PM

## 2022-06-19 NOTE — TOC Progression Note (Signed)
Transition of Care [facility]) - Progression Note    Patient Details  Name: Cynthia Salazar MRN: [id number] Date of Birth: 06/29/1991  Transition of Care [facility]) CM/SW [place], RN Phone Number: 06/19/2022, 3:36 PM  Clinical Narrative:   Received message that CM with [employer] available to assist with dc needs. Information is noted, left voicemail for [employer] [phone] extt [phone]. TOC will reach out with any needs.          Expected Discharge Plan and Services                                                 Social Determinants of Health (SDOH) Interventions    Readmission Risk Interventions     No data to display

## 2022-06-19 NOTE — TOC Progression Note (Signed)
Transition of Care [facility]) - Progression Note    Patient Details  Name: Cynthia Salazar MRN: [id number] Date of Birth: July 26, 1991  Transition of Care [facility]) CM/SW Contact  Purcell Mouton, RN Phone Number: 06/19/2022, 10:57 AM  Clinical Narrative:     Spoke with pt concerning Substance Abuse, resources listed on pt's AVS. List was given to pt by her RN. For pt to go to IP rehab, pt would need to pay private or have psych recommendation. Explained this information to the pt. Pt states that she is calling some places now.        Expected Discharge Plan and Services                                                 Social Determinants of Health (SDOH) Interventions    Readmission Risk Interventions     No data to display

## 2022-06-19 NOTE — Progress Notes (Signed)
PROGRESS NOTE    [name]  IOE:[id number] DOB: 01/02/1991 DOA: 06/17/2022 PCP: Bradd Canary, MD    Brief Narrative:   Cynthia Salazar is a 31 y.o. female with past medical history significant for depression, asthma, chronic THC abuse, EtOH abuse who presented to [facility] on 10/15 with complaints of nausea, vomiting with concerns for acute alcohol withdrawal.  Patient reports she drinks at least a pint of liquor a day and started feeling shaky the day prior to admission.  She initially went to the ED and was sent home with Librium.  Patient reports that her symptoms became worse and she sought further attention in the ED once again.  In the ED, temperature 98.4 F, HR 127, RR 18, BP 137/96, SPO2 97% on room air.  WBC 6.5, hemoglobin 14.1, platelets 105.  Sodium 138, potassium 3.7, chloride 95, CO2 27, glucose 126, BUN 5, creatinine 0.70.  AST 90, ALT 43, total bilirubin 1.0.  High sensitive troponin 8.  BNP 20.1.  Pregnancy hCG negative.  Influenza A/B PCR negative.  COVID-19 PCR negative.  EtOH level 442.  D-dimer elevated 0.66.  UDS positive for THC.  Chest x-ray with small opacity right lung apex reflective of atelectasis versus infiltrate.  CT angiogram chest negative for pulmonary embolism, no evidence of thoracic aortic dissection, noted ectasia of main pulmonary artery suggestive of possible pulmonary hypertension, no focal pulmonary consolidation and no pleural effusion, noted fatty liver and enlarged spleen.  Patient received 260 mg of IV phenobarbital and started on CIWA protocol.  TRH was consulted for admission and patient was transferred to [facility] for further evaluation and treatment of acute alcohol withdrawal, acute alcohol intoxication.  Assessment & Plan:   EtOH withdrawal/intoxication Patient presenting to the ED with nausea/vomiting with concerns for acute alcohol withdrawal.  Patient endorses 1 pint of liquor a day.  EtOH level elevated at 442  admission, with notable AST/ALT elevation consistent with alcohol abuse.  Received IV phenobarbital prior to transfer from med center to [facility] by EDP. --EtOH 442>, <10 --CIWA protocol with symptom triggered Ativan --Thiamine, folic acid, multivitamin --Supportive care, antiemetics --TOC for substance abuse resources --Psychiatry consulted for evaluation as patient requesting inpatient substance abuse rehabilitation  Hypokalemia Potassium 3.2, will replete. -- Repeat electrolytes in a.m.  Hypomagnesemia Magnesium 1.9, will replete. --Repeat magnesium level in a.m.  Elevated D-dimer D-dimer elevated 0.66.  CT angiogram chest negative for pulmonary embolism.  Bilateral venous duplex ultrasound lower extremities negative for DVT.  Transaminitis AST 90, ALT 43 on admission, consistent with alcohol abuse.  Total bilirubin within normal limits.  Acute hepatitis panel negative.  Right upper quadrant ultrasound with fatty liver, no acute findings. -- Repeat CMP in the a.m.  Thrombocytopenia Etiology likely secondary to chronic EtOH abuse.  Avoiding antiplatelet/anticoagulant. --Plt 105>48>46 --repeat CBC in am  THC abuse, chronic UDS positive for THC.  Counseled on need for complete cessation.  DVT prophylaxis: Place and maintain sequential compression device Start: 06/18/22 0831    Code Status: Full Code Family Communication: No family present at bedside this morning  Disposition Plan:  Level of care: Progressive Status is: Inpatient Remains inpatient appropriate because: CIWA protocol for active EtOH withdrawal, psychiatry consulted as patient requesting inpatient substance abuse rehabilitation    Consultants:  Psychiatry  Procedures:  Right upper quadrant ultrasound Vascular duplex ultrasound bilateral lower extremities  Antimicrobials:  None   Subjective: Patient seen and examined at bedside, resting comfortably.  Lying in bed.  Continues with slight  anxiousness.  Requesting inpatient substance abuse treatment.  Discussed with social work who recommended that psychiatry needs to evaluate for that recommendation.  Continue to discuss need for complete alcohol cessation.  Denies headache, no dizziness, no chest pain, no shortness of breath, no abdominal pain, no fever/chills/night sweats, no current vomiting/diarrhea, no abdominal pain, no focal weakness, no fatigue, no paresthesias.  No acute events overnight per nursing staff.  Objective: Vitals:   06/18/22 2122 06/19/22 0056 06/19/22 0517 06/19/22 0800  BP: (!) 128/101 (!) 121/91 (!) 131/98 (!) 139/109  Pulse: (!) 104 100 100 98  Resp: 18 17 17 16   Temp: 99.1 F (37.3 C) 99.5 F (37.5 C) 99.4 F (37.4 C) 99.6 F (37.6 C)  TempSrc: Oral Oral Oral Oral  SpO2: 95% 97% 96%   Weight:      Height:        Intake/Output Summary (Last 24 hours) at 06/19/2022 1109 Last data filed at 06/19/2022 0600 Gross per 24 hour  Intake 1425.04 ml  Output --  Net 1425.04 ml   Filed Weights   06/17/22 0052 06/17/22 1640  Weight: 68 kg 73.1 kg    Examination:  Physical Exam: GEN: NAD, alert and oriented x 3, chronically ill appearance, appears older than stated age HEENT: NCAT, PERRL, EOMI, sclera clear, MMM, on room air PULM: CTAB w/o wheezes/crackles, normal respiratory effort CV: Tachycardic, regular rhythm w/o M/G/R GI: abd soft, NTND, NABS, no R/G/M MSK: no peripheral edema, muscle strength globally intact 5/5 bilateral upper/lower extremities NEURO: CN II-XII intact, no focal deficits, sensation to light touch intact PSYCH: Depressed mood, flat affect Integumentary: dry/intact, no rashes or wounds    Data Reviewed: I have personally reviewed following labs and imaging studies  CBC: Recent Labs  Lab 06/16/22 0211 06/17/22 0102 06/17/22 1717 06/18/22 0255 06/19/22 0349  WBC 10.1 6.5 4.1 2.7* 3.5*  NEUTROABS  --   --  3.4  --   --   HGB 14.4 14.1 11.6* 11.8* 11.7*  HCT 40.3  40.8 33.6* 35.3* 34.4*  MCV 88.4 88.9 92.8 92.9 92.5  PLT 146* 105* 54* 48* 46*   Basic Metabolic Panel: Recent Labs  Lab 06/16/22 0211 06/17/22 0102 06/17/22 1717 06/18/22 0255 06/18/22 0714 06/19/22 0349  NA 133* 138 133* 132*  --  135  K 3.8 3.7 3.1* 2.9*  --  3.2*  CL 91* 95* 94* 92*  --  99  CO2 18* 27 27 31   --  28  GLUCOSE 125* 126* 106* 91  --  95  BUN 9 5* <5* <5*  --  <5*  CREATININE 0.59 0.70 0.49 0.49  --  0.48  CALCIUM 9.2 9.3 8.0* 8.1*  --  8.5*  MG  --   --   --   --  1.3* 1.9   GFR: Estimated Creatinine Clearance: 94.5 mL/min (by C-G formula based on SCr of 0.48 mg/dL). Liver Function Tests: Recent Labs  Lab 06/16/22 0211 06/17/22 0102 06/17/22 1717 06/18/22 0255 06/19/22 0349  AST 55* 90* 128* 113* 99*  ALT 33 43 54* 49* 47*  ALKPHOS 62 64 57 54 48  BILITOT 1.7* 1.0 1.7* 2.1* 1.8*  PROT 8.7* 8.8* 6.8 6.6 6.4*  ALBUMIN 4.8 4.8 3.7 3.4* 3.2*   No results for input(s): "LIPASE", "AMYLASE" in the last 168 hours. No results for input(s): "AMMONIA" in the last 168 hours. Coagulation Profile: No results for input(s): "INR", "PROTIME" in the last 168 hours. Cardiac Enzymes: No  results for input(s): "CKTOTAL", "CKMB", "CKMBINDEX", "TROPONINI" in the last 168 hours. BNP (last 3 results) No results for input(s): "PROBNP" in the last 8760 hours. HbA1C: No results for input(s): "HGBA1C" in the last 72 hours. CBG: No results for input(s): "GLUCAP" in the last 168 hours. Lipid Profile: No results for input(s): "CHOL", "HDL", "LDLCALC", "TRIG", "CHOLHDL", "LDLDIRECT" in the last 72 hours. Thyroid Function Tests: No results for input(s): "TSH", "T4TOTAL", "FREET4", "T3FREE", "THYROIDAB" in the last 72 hours. Anemia Panel: No results for input(s): "VITAMINB12", "FOLATE", "FERRITIN", "TIBC", "IRON", "RETICCTPCT" in the last 72 hours. Sepsis Labs: No results for input(s): "PROCALCITON", "LATICACIDVEN" in the last 168 hours.  Recent Results (from the past 240  hour(s))  Resp Panel by RT-PCR (Flu A&B, Covid) Anterior Nasal Swab     Status: None   Collection Time: 06/17/22  4:26 AM   Specimen: Anterior Nasal Swab  Result Value Ref Range Status   SARS Coronavirus 2 by RT PCR NEGATIVE NEGATIVE Final    Comment: (NOTE) SARS-CoV-2 target nucleic acids are NOT DETECTED.  The SARS-CoV-2 RNA is generally detectable in upper respiratory specimens during the acute phase of infection. The lowest concentration of SARS-CoV-2 viral copies this assay can detect is 138 copies/mL. A negative result does not preclude SARS-Cov-2 infection and should not be used as the sole basis for treatment or other patient management decisions. A negative result may occur with  improper specimen collection/handling, submission of specimen other than nasopharyngeal swab, presence of viral mutation(s) within the areas targeted by this assay, and inadequate number of viral copies(<138 copies/mL). A negative result must be combined with clinical observations, patient history, and epidemiological information. The expected result is Negative.  Fact Sheet for Patients:  BloggerCourse.com  Fact Sheet for Healthcare Providers:  SeriousBroker.it  This test is no t yet approved or cleared by the [place] FDA and  has been authorized for detection and/or diagnosis of SARS-CoV-2 by FDA under an Emergency Use Authorization (EUA). This EUA will remain  in effect (meaning this test can be used) for the duration of the COVID-19 declaration under Section 564(b)(1) of the Act, 21 U.S.C.section 360bbb-3(b)(1), unless the authorization is terminated  or revoked sooner.       Influenza A by PCR NEGATIVE NEGATIVE Final   Influenza B by PCR NEGATIVE NEGATIVE Final    Comment: (NOTE) The Xpert Xpress SARS-CoV-2/FLU/RSV plus assay is intended as an aid in the diagnosis of influenza from Nasopharyngeal swab specimens and should not be  used as a sole basis for treatment. Nasal washings and aspirates are unacceptable for Xpert Xpress SARS-CoV-2/FLU/RSV testing.  Fact Sheet for Patients: BloggerCourse.com  Fact Sheet for Healthcare Providers: SeriousBroker.it  This test is not yet approved or cleared by the [place] FDA and has been authorized for detection and/or diagnosis of SARS-CoV-2 by FDA under an Emergency Use Authorization (EUA). This EUA will remain in effect (meaning this test can be used) for the duration of the COVID-19 declaration under Section 564(b)(1) of the Act, 21 U.S.C. section 360bbb-3(b)(1), unless the authorization is terminated or revoked.  Performed at [facility], [address], [place], [place] [address]   MRSA Next Gen by PCR, Nasal     Status: None   Collection Time: 06/17/22  4:47 PM   Specimen: Nasal Mucosa; Nasal Swab  Result Value Ref Range Status   MRSA by PCR Next Gen NOT DETECTED NOT DETECTED Final    Comment: (NOTE) The GeneXpert MRSA Assay (FDA approved for  NASAL specimens only), is one component of a comprehensive MRSA colonization surveillance program. It is not intended to diagnose MRSA infection nor to guide or monitor treatment for MRSA infections. Test performance is not FDA approved in patients less than 32 years old. Performed at [facility], [address] [place], [place] [address]          Radiology Studies: VAS US LOWER EXTREMITY VENOUS (DVT)  Result Date: 06/18/2022  Lower Venous DVT Study Patient Name:  Albertina ParrSHLEY Bilski  Date of Exam:   06/18/2022 Medical Rec #: [id number]        Accession #:    [id number] Date of Birth: 09-Mar-1991        Patient Gender: F Patient Age:   [age] years [facility]am [facility] Procedure:      VAS US LOWER EXTREMITY VENOUS (DVT) Referring Phys: Margie EgeYRONE KYLE  --------------------------------------------------------------------------------  Indications: Edema.  Risk Factors: None identified. Comparison Study: No prior studies. Performing Technologist: Chanda BusingGregory Collins RVT  Examination Guidelines: A complete evaluation includes B-mode imaging, spectral Doppler, color Doppler, and power Doppler as needed of all accessible portions of each vessel. Bilateral testing is considered an integral part of a complete examination. Limited examinations for reoccurring indications may be performed as noted. The reflux portion of the exam is performed with the patient in reverse Trendelenburg.  +---------+---------------+---------+-----------+----------+--------------+ RIGHT    CompressibilityPhasicitySpontaneityPropertiesThrombus Aging +---------+---------------+---------+-----------+----------+--------------+ CFV      Full           Yes      Yes                                 +---------+---------------+---------+-----------+----------+--------------+ SFJ      Full                                                        +---------+---------------+---------+-----------+----------+--------------+ FV Prox  Full                                                        +---------+---------------+---------+-----------+----------+--------------+ FV Mid   Full                                                        +---------+---------------+---------+-----------+----------+--------------+ FV DistalFull                                                        +---------+---------------+---------+-----------+----------+--------------+ PFV      Full                                                        +---------+---------------+---------+-----------+----------+--------------+  POP      Full           Yes      Yes                                 +---------+---------------+---------+-----------+----------+--------------+ PTV      Full                                                         +---------+---------------+---------+-----------+----------+--------------+ PERO     Full                                                        +---------+---------------+---------+-----------+----------+--------------+   +---------+---------------+---------+-----------+----------+--------------+ LEFT     CompressibilityPhasicitySpontaneityPropertiesThrombus Aging +---------+---------------+---------+-----------+----------+--------------+ CFV      Full           Yes      Yes                                 +---------+---------------+---------+-----------+----------+--------------+ SFJ      Full                                                        +---------+---------------+---------+-----------+----------+--------------+ FV Prox  Full                                                        +---------+---------------+---------+-----------+----------+--------------+ FV Mid   Full                                                        +---------+---------------+---------+-----------+----------+--------------+ FV DistalFull                                                        +---------+---------------+---------+-----------+----------+--------------+ PFV      Full                                                        +---------+---------------+---------+-----------+----------+--------------+ POP      Full           Yes      Yes                                 +---------+---------------+---------+-----------+----------+--------------+  PTV      Full                                                        +---------+---------------+---------+-----------+----------+--------------+ PERO     Full                                                        +---------+---------------+---------+-----------+----------+--------------+     Summary: RIGHT: - There is no evidence of deep vein thrombosis in the  lower extremity.  - No cystic structure found in the popliteal fossa.  LEFT: - There is no evidence of deep vein thrombosis in the lower extremity.  - No cystic structure found in the popliteal fossa.  *See table(s) above for measurements and observations. Electronically signed by Gerarda Fraction on 06/18/2022 at 5:24:10 PM.    Final    US Abdomen Limited RUQ (LIVER/GB)  Result Date: 06/17/2022 CLINICAL DATA:  Elevated LFTs EXAM: ULTRASOUND ABDOMEN LIMITED RIGHT UPPER QUADRANT COMPARISON:  None Available. FINDINGS: Gallbladder: No gallstones or wall thickening visualized. No sonographic Murphy sign noted by sonographer. Common bile duct: Diameter: Normal caliber, 3-4 mm Liver: Diffusely increased echotexture throughout the liver. No focal hepatic abnormality. Portal vein is patent on color Doppler imaging with normal direction of blood flow towards the liver. Other: None. IMPRESSION: Fatty liver. No acute findings. Electronically Signed   By: Charlett Nose M.D.   On: 06/17/2022 20:28        Scheduled Meds:  Chlorhexidine Gluconate Cloth  6 each Topical Daily   folic acid  1 mg Oral Daily   LORazepam  0-4 mg Intravenous Q12H   Or   LORazepam  0-4 mg Oral Q12H   multivitamin with minerals  1 tablet Oral Daily   potassium chloride  40 mEq Oral Q3H   thiamine  100 mg Oral Daily   Continuous Infusions:     LOS: 2 days    Time spent: 51 minutes spent on chart review, discussion with nursing staff, consultants, updating family and interview/physical exam; more than 50% of that time was spent in counseling and/or coordination of care.    Alvira Philips [place], DO [facility] Available via Epic secure chat 7am-7pm After these hours, please refer to coverage provider listed on [email or web address] 06/19/2022, 11:09 AM

## 2022-06-20 LAB — COMPREHENSIVE METABOLIC PANEL
ALT: 53 U/L — ABNORMAL HIGH (ref 0–44)
AST: 104 U/L — ABNORMAL HIGH (ref 15–41)
Albumin: 3.8 g/dL (ref 3.5–5.0)
Alkaline Phosphatase: 57 U/L (ref 38–126)
Anion gap: 6 (ref 5–15)
BUN: <5 mg/dL — ABNORMAL LOW (ref 6–20)
CO2: 25 mmol/L (ref 22–32)
Calcium: 9 mg/dL (ref 8.9–10.3)
Chloride: 102 mmol/L (ref 98–111)
Creatinine, Ser: 0.53 mg/dL (ref 0.44–1.00)
GFR, Estimated: >60 mL/min (ref >60)
Glucose, Bld: 86 mg/dL (ref 70–99)
Potassium: 4 mmol/L (ref 3.5–5.1)
Sodium: 133 mmol/L — ABNORMAL LOW (ref 135–145)
Total Bilirubin: 1.4 mg/dL — ABNORMAL HIGH (ref 0.3–1.2)
Total Protein: 7.3 g/dL (ref 6.5–8.1)

## 2022-06-20 LAB — CBC
HCT: 39.3 % (ref 36.0–46.0)
Hemoglobin: 13.1 g/dL (ref 12.0–15.0)
MCH: 31.6 pg (ref 26.0–34.0)
MCHC: 33.3 g/dL (ref 30.0–36.0)
MCV: 94.7 fL (ref 80.0–100.0)
Platelets: 59 10*3/uL — ABNORMAL LOW (ref 150–400)
RBC: 4.15 MIL/uL (ref 3.87–5.11)
RDW: 14 % (ref 11.5–15.5)
WBC: 4.3 10*3/uL (ref 4.0–10.5)
nRBC: 0 % (ref 0.0–0.2)

## 2022-06-20 LAB — PHOSPHORUS: Phosphorus: 1.9 mg/dL — ABNORMAL LOW (ref 2.5–4.6)

## 2022-06-20 LAB — MAGNESIUM: Magnesium: 2.1 mg/dL (ref 1.7–2.4)

## 2022-06-20 MED ORDER — FOLIC ACID 1 MG PO TABS: 1.0000 mg | ORAL_TABLET | Freq: Every day | ORAL | Status: DC

## 2022-06-20 MED ORDER — ADULT MULTIVITAMIN W/MINERALS CH: 1.0000 | ORAL_TABLET | Freq: Every day | ORAL | Status: DC

## 2022-06-20 MED ORDER — VITAMIN B-1 100 MG PO TABS: 100.0000 mg | ORAL_TABLET | Freq: Every day | ORAL | Status: DC

## 2022-06-20 NOTE — Discharge Summary (Signed)
Physician Discharge Summary   Cynthia Salazar  female DOB: Dec 19, 1990  ZOX:[id number]  PCP: Cynthia Canary, MD  Admit date: 06/17/2022 Discharge date: 06/20/2022  Admitted From: home Disposition:  mother to take pt directly to inpatient alcohol rehab facility CODE STATUS: Full code   Hospital Course:  For full details, please see H&P, progress notes, consult notes and ancillary notes.  Briefly,  Cynthia Salazar is a 31 y.o. female with past medical history significant for depression, asthma, chronic THC abuse, EtOH abuse who presented to [facility] on 10/15 with complaints of nausea, vomiting with concerns for acute alcohol withdrawal.    Patient reports she drinks at least a pint of liquor a day and started feeling shaky the day prior to admission.  She initially went to the ED and was sent home with Librium.  Patient reports that her symptoms became worse and she sought further attention in the ED once again.  EtOH withdrawal/intoxication EtOH level elevated at 442 admission, with notable AST/ALT elevation consistent with alcohol abuse.  Received IV phenobarbital prior to transfer from med center to [facility] long hospital by EDP. --monitored on CIWA protocol with symptom triggered Ativan --Thiamine, folic acid, multivitamin --Psychiatry consulted.  Pt was referred to inpatient alcohol rehab program.   Hypokalemia Hypomagnesemia --monitored and repleted PRN.   Transaminitis AST 90, ALT 43 on admission, consistent with alcohol abuse.  Total bilirubin within normal limits.  Acute hepatitis panel negative.  Right upper quadrant ultrasound with fatty liver, no acute findings.   Thrombocytopenia Etiology likely secondary to chronic EtOH abuse.     THC abuse, chronic UDS positive for THC.  Counseled on need for complete cessation.   Discharge Diagnoses:  Principal Problem:   Alcohol withdrawal (HCC) Active Problems:   Depression with anxiety   Elevated LFTs    Alcohol use (HCC)   Elevated d-dimer   Marijuana abuse   30 Day Unplanned Readmission Risk Score    Flowsheet Row ED to Hosp-Admission (Current) from 06/17/2022 in [facility]LO[facility] C[facility] UROLOGY  30 Day Unplanned Readmission Risk Score (%) 12.19 Filed at 06/20/2022 0801       This score is the patient's risk of an unplanned readmission within 30 days of being discharged (0 -100%). The score is based on dignosis, age, lab data, medications, orders, and past utilization.   Low:  0-14.9   Medium: 15-21.9   High: 22-29.9   Extreme: 30 and above         Discharge Instructions:  Allergies as of 06/20/2022       Reactions   Aleve [naproxen Sodium]    wheeze        Medication List     TAKE these medications    folic acid 1 MG tablet Commonly known as: FOLVITE Take 1 tablet (1 mg total) by mouth daily. Start taking on: June 21, 2022   multivitamin with minerals Tabs tablet Take 1 tablet by mouth daily. Start taking on: June 21, 2022   thiamine 100 MG tablet Commonly known as: Vitamin B-1 Take 1 tablet (100 mg total) by mouth daily. Start taking on: June 21, 2022         Follow-up Information     Cynthia Canary, MD Follow up in 1 week(s).   Specialty: Family Medicine Contact information: [address] [place] [address] [phone]                 Allergies  Allergen Reactions   Aleve [Naproxen Sodium]     wheeze     The results of significant diagnostics from this hospitalization (including imaging, microbiology, ancillary and laboratory) are listed below for reference.   Consultations:   Procedures/Studies: VAS [place] LOWER EXTREMITY VENOUS (DVT)  Result Date: 06/18/2022  Lower Venous DVT Study Patient Name:  Cynthia Salazar  Date of Exam:   06/18/2022 Medical Rec #: [id number]        Accession #:    [id number] Date of Birth: 1991-05-18        Patient Gender: F Patient Age:   50 years Exam  Location:  [facility] Procedure:      VAS [place] LOWER EXTREMITY VENOUS (DVT) Referring Phys: Margie Ege --------------------------------------------------------------------------------  Indications: Edema.  Risk Factors: None identified. Comparison Study: No prior studies. Performing Technologist: Chanda Busing RVT  Examination Guidelines: A complete evaluation includes B-mode imaging, spectral Doppler, color Doppler, and power Doppler as needed of all accessible portions of each vessel. Bilateral testing is considered an integral part of a complete examination. Limited examinations for reoccurring indications may be performed as noted. The reflux portion of the exam is performed with the patient in reverse Trendelenburg.  +---------+---------------+---------+-----------+----------+--------------+ RIGHT    CompressibilityPhasicitySpontaneityPropertiesThrombus Aging +---------+---------------+---------+-----------+----------+--------------+ CFV      Full           Yes      Yes                                 +---------+---------------+---------+-----------+----------+--------------+ SFJ      Full                                                        +---------+---------------+---------+-----------+----------+--------------+ FV Prox  Full                                                        +---------+---------------+---------+-----------+----------+--------------+ FV Mid   Full                                                        +---------+---------------+---------+-----------+----------+--------------+ FV DistalFull                                                        +---------+---------------+---------+-----------+----------+--------------+ PFV      Full                                                        +---------+---------------+---------+-----------+----------+--------------+ POP      Full           Yes  Yes                                  +---------+---------------+---------+-----------+----------+--------------+ PTV      Full                                                        +---------+---------------+---------+-----------+----------+--------------+ PERO     Full                                                        +---------+---------------+---------+-----------+----------+--------------+   +---------+---------------+---------+-----------+----------+--------------+ LEFT     CompressibilityPhasicitySpontaneityPropertiesThrombus Aging +---------+---------------+---------+-----------+----------+--------------+ CFV      Full           Yes      Yes                                 +---------+---------------+---------+-----------+----------+--------------+ SFJ      Full                                                        +---------+---------------+---------+-----------+----------+--------------+ FV Prox  Full                                                        +---------+---------------+---------+-----------+----------+--------------+ FV Mid   Full                                                        +---------+---------------+---------+-----------+----------+--------------+ FV DistalFull                                                        +---------+---------------+---------+-----------+----------+--------------+ PFV      Full                                                        +---------+---------------+---------+-----------+----------+--------------+ POP      Full           Yes      Yes                                 +---------+---------------+---------+-----------+----------+--------------+ PTV      Full                                                        +---------+---------------+---------+-----------+----------+--------------+  PERO     Full                                                         +---------+---------------+---------+-----------+----------+--------------+     Summary: RIGHT: - There is no evidence of deep vein thrombosis in the lower extremity.  - No cystic structure found in the popliteal fossa.  LEFT: - There is no evidence of deep vein thrombosis in the lower extremity.  - No cystic structure found in the popliteal fossa.  *See table(s) above for measurements and observations. Electronically signed by Orlie Pollen on 06/18/2022 at 5:24:10 PM.    Final    US Abdomen Limited RUQ (LIVER/GB)  Result Date: 06/17/2022 CLINICAL DATA:  Elevated LFTs EXAM: ULTRASOUND ABDOMEN LIMITED RIGHT UPPER QUADRANT COMPARISON:  None Available. FINDINGS: Gallbladder: No gallstones or wall thickening visualized. No sonographic Murphy sign noted by sonographer. Common bile duct: Diameter: Normal caliber, 3-4 mm Liver: Diffusely increased echotexture throughout the liver. No focal hepatic abnormality. Portal vein is patent on color Doppler imaging with normal direction of blood flow towards the liver. Other: None. IMPRESSION: Fatty liver. No acute findings. Electronically Signed   By: Rolm Baptise M.D.   On: 06/17/2022 20:28   CT Angio Chest PE W and/or Wo Contrast  Result Date: 06/17/2022 CLINICAL DATA:  Positive D-dimer, shortness of breath EXAM: CT ANGIOGRAPHY CHEST WITH CONTRAST TECHNIQUE: Multidetector CT imaging of the chest was performed using the standard protocol during bolus administration of intravenous contrast. Multiplanar CT image reconstructions and MIPs were obtained to evaluate the vascular anatomy. RADIATION DOSE REDUCTION: This exam was performed according to the departmental dose-optimization program which includes automated exposure control, adjustment of the mA and/or kV according to patient size and/or use of iterative reconstruction technique. CONTRAST:  150mL OMNIPAQUE IOHEXOL 350 MG/ML SOLN COMPARISON:  Chest radiographs done earlier today FINDINGS: Cardiovascular: There is  homogeneous enhancement in thoracic aorta. There is mild ectasia of ascending thoracic aorta measuring 3.2 cm. There is ectasia of main pulmonary artery measuring 3.7 cm. There are no intraluminal filling defects in pulmonary artery branches. Mediastinum/Nodes: There are there are enlarged lymph nodes in mediastinum in both hilar regions. Largest of the nodes is seen in the right side of superior mediastinum measuring 13 mm in short axis. There are slightly enlarged lymph nodes in both hilar regions, more so on the left side. Lungs/Pleura: No focal pulmonary infiltrates are seen. There is no pleural effusion or pneumothorax. Upper Abdomen: There is fatty infiltration in liver. Spleen measures 13.4 cm in maximum diameter. There is subtle increase in density in the lumen of gallbladder without demonstrable discrete calculi. This finding may suggest presence of sludge in gallbladder. There is no wall thickening in the gallbladder. There is no fluid around the gallbladder. Musculoskeletal: No acute findings are seen. Review of the MIP images confirms the above findings. IMPRESSION: There is no evidence of pulmonary artery embolism. There is no evidence of thoracic aortic dissection. There is ectasia of main pulmonary artery suggesting possible pulmonary arterial hypertension. There are slightly enlarged lymph nodes in mediastinum and both hilar regions, possibly suggesting reactive hyperplasia of lymph nodes. There is no focal pulmonary consolidation. There is no pleural effusion. Fatty liver.  Enlarged spleen. Electronically Signed   By: Elmer Picker M.D.   On: 06/17/2022 11:00  DG Chest Portable 1 View  Result Date: 06/17/2022 CLINICAL DATA:  Shortness of breath EXAM: PORTABLE CHEST 1 VIEW COMPARISON:  04/30/2021 FINDINGS: The heart size and mediastinal contours are within normal limits. Low lung volumes. Crowding of the central bronchovascular markings. Small opacity at the medial aspect of the right  lung apex. No pleural effusion or pneumothorax. The visualized skeletal structures are unremarkable. IMPRESSION: Low lung volumes. Small opacity at the right lung apex, which may reflect atelectasis or infiltrate. Repeat inspiratory PA and lateral radiographs of the chest are suggested to see if this finding persists. Electronically Signed   By: Duanne GuessNicholas  Plundo D.O.   On: 06/17/2022 09:37      Labs: BNP (last 3 results) Recent Labs    06/17/22 1049  BNP 20.1   Basic Metabolic Panel: Recent Labs  Lab 06/17/22 0102 06/17/22 1717 06/18/22 0255 06/18/22 0714 06/19/22 0349 06/20/22 0407  NA 138 133* 132*  --  135 133*  K 3.7 3.1* 2.9*  --  3.2* 4.0  CL 95* 94* 92*  --  99 102  CO2 27 27 31   --  28 25  GLUCOSE 126* 106* 91  --  95 86  BUN 5* <5* <5*  --  <5* <5*  CREATININE 0.70 0.49 0.49  --  0.48 0.53  CALCIUM 9.3 8.0* 8.1*  --  8.5* 9.0  MG  --   --   --  1.3* 1.9 2.1   Liver Function Tests: Recent Labs  Lab 06/17/22 0102 06/17/22 1717 06/18/22 0255 06/19/22 0349 06/20/22 0407  AST 90* 128* 113* 99* 104*  ALT 43 54* 49* 47* 53*  ALKPHOS 64 57 54 48 57  BILITOT 1.0 1.7* 2.1* 1.8* 1.4*  PROT 8.8* 6.8 6.6 6.4* 7.3  ALBUMIN 4.8 3.7 3.4* 3.2* 3.8   No results for input(s): "LIPASE", "AMYLASE" in the last 168 hours. No results for input(s): "AMMONIA" in the last 168 hours. CBC: Recent Labs  Lab 06/17/22 0102 06/17/22 1717 06/18/22 0255 06/19/22 0349 06/20/22 0407  WBC 6.5 4.1 2.7* 3.5* 4.3  NEUTROABS  --  3.4  --   --   --   HGB 14.1 11.6* 11.8* 11.7* 13.1  HCT 40.8 33.6* 35.3* 34.4* 39.3  MCV 88.9 92.8 92.9 92.5 94.7  PLT 105* 54* 48* 46* 59*   Cardiac Enzymes: No results for input(s): "CKTOTAL", "CKMB", "CKMBINDEX", "TROPONINI" in the last 168 hours. BNP: Invalid input(s): "POCBNP" CBG: No results for input(s): "GLUCAP" in the last 168 hours. D-Dimer Recent Labs    06/17/22 0931  DDIMER 0.66*   Hgb A1c No results for input(s): "HGBA1C" in the last  72 hours. Lipid Profile No results for input(s): "CHOL", "HDL", "LDLCALC", "TRIG", "CHOLHDL", "LDLDIRECT" in the last 72 hours. Thyroid function studies No results for input(s): "TSH", "T4TOTAL", "T3FREE", "THYROIDAB" in the last 72 hours.  Invalid input(s): "FREET3" Anemia work up No results for input(s): "VITAMINB12", "FOLATE", "FERRITIN", "TIBC", "IRON", "RETICCTPCT" in the last 72 hours. Urinalysis    Component Value Date/Time   BILIRUBINUR negative 04/24/2021 1751   BILIRUBINUR neg 01/02/2017 1337   KETONESUR trace (5) (A) 04/24/2021 1751   PROTEINUR negative 04/24/2021 1751   PROTEINUR neg 01/02/2017 1337   UROBILINOGEN 0.2 04/24/2021 1751   NITRITE Negative 04/24/2021 1751   NITRITE neg 01/02/2017 1337   LEUKOCYTESUR Negative 04/24/2021 1751   Sepsis Labs Recent Labs  Lab 06/17/22 1717 06/18/22 0255 06/19/22 0349 06/20/22 0407  WBC 4.1 2.7* 3.5* 4.3   Microbiology Recent  Results (from the past 240 hour(s))  Resp Panel by RT-PCR (Flu A&B, Covid) Anterior Nasal Swab     Status: None   Collection Time: 06/17/22  4:26 AM   Specimen: Anterior Nasal Swab  Result Value Ref Range Status   SARS Coronavirus 2 by RT PCR NEGATIVE NEGATIVE Final    Comment: (NOTE) SARS-CoV-2 target nucleic acids are NOT DETECTED.  The SARS-CoV-2 RNA is generally detectable in upper respiratory specimens during the acute phase of infection. The lowest concentration of SARS-CoV-2 viral copies this assay can detect is 138 copies/mL. A negative result does not preclude SARS-Cov-2 infection and should not be used as the sole basis for treatment or other patient management decisions. A negative result may occur with  improper specimen collection/handling, submission of specimen other than nasopharyngeal swab, presence of viral mutation(s) within the areas targeted by this assay, and inadequate number of viral copies(<138 copies/mL). A negative result must be combined with clinical observations,  patient history, and epidemiological information. The expected result is Negative.  Fact Sheet for Patients:  BloggerCourse.com  Fact Sheet for Healthcare Providers:  SeriousBroker.it  This test is no t yet approved or cleared by the [place] FDA and  has been authorized for detection and/or diagnosis of SARS-CoV-2 by FDA under an Emergency Use Authorization (EUA). This EUA will remain  in effect (meaning this test can be used) for the duration of the COVID-19 declaration under Section 564(b)(1) of the Act, 21 U.S.C.section 360bbb-3(b)(1), unless the authorization is terminated  or revoked sooner.       Influenza A by PCR NEGATIVE NEGATIVE Final   Influenza B by PCR NEGATIVE NEGATIVE Final    Comment: (NOTE) The Xpert Xpress SARS-CoV-2/FLU/RSV plus assay is intended as an aid in the diagnosis of influenza from Nasopharyngeal swab specimens and should not be used as a sole basis for treatment. Nasal washings and aspirates are unacceptable for Xpert Xpress SARS-CoV-2/FLU/RSV testing.  Fact Sheet for Patients: BloggerCourse.com  Fact Sheet for Healthcare Providers: SeriousBroker.it  This test is not yet approved or cleared by the [place] FDA and has been authorized for detection and/or diagnosis of SARS-CoV-2 by FDA under an Emergency Use Authorization (EUA). This EUA will remain in effect (meaning this test can be used) for the duration of the COVID-19 declaration under Section 564(b)(1) of the Act, 21 U.S.C. section 360bbb-3(b)(1), unless the authorization is terminated or revoked.  Performed at [facility], [address], [place], [place] [address]   MRSA Next Gen by PCR, Nasal     Status: None   Collection Time: 06/17/22  4:47 PM   Specimen: Nasal Mucosa; Nasal Swab  Result Value Ref Range Status   MRSA by PCR Next Gen NOT DETECTED NOT  DETECTED Final    Comment: (NOTE) The GeneXpert MRSA Assay (FDA approved for NASAL specimens only), is one component of a comprehensive MRSA colonization surveillance program. It is not intended to diagnose MRSA infection nor to guide or monitor treatment for MRSA infections. Test performance is not FDA approved in patients less than 39 years old. Performed at [facility], [address] [place], [place] [address]      Total time spend on discharging this patient, including the last patient exam, discussing the hospital stay, instructions for ongoing care as it relates to all pertinent caregivers, as well as preparing the medical discharge records, prescriptions, and/or referrals as applicable, is 30 minutes.    Darlin Priestly, MD  [facility] 06/20/2022,  9:23 AM

## 2022-06-20 NOTE — TOC Transition Note (Signed)
Transition of Care [facility]) - CM/SW Discharge Note   Patient Details  Name: Cynthia Salazar MRN: [id number] Date of Birth: November 11, 1990  [facility]) CM/SW Contact:  Leeroy Cha, RN Phone Number: 06/20/2022, 10:45 AM   Clinical Narrative:     Patient transported via the mother.  Final next level of care: [facility] (pvt. etoh and drug rehab on the [employer] Barriers to Discharge: Barriers Resolved   Patient Goals and CMS Choice        Discharge Placement                       Discharge Plan and Services                                     Social Determinants of Health (SDOH) Interventions     Readmission Risk Interventions   No data to display

## 2022-06-20 NOTE — TOC Progression Note (Signed)
Transition of Care [facility] - Progression Note    Patient Details  Name: Cynthia Salazar MRN: [id number] Date of Birth: December 31, 1990  Transition of [facility]) CM/SW Contact  Leeroy Cha, RN Phone Number: 06/20/2022, 10:43 AM  Clinical Narrative:    Patient is going to private rehab etoh facility via family.  She is not ivc'd.  Mother will be the transport.        Expected Discharge Plan and Services           Expected Discharge Date: 06/20/22                                     Social Determinants of Health (SDOH) Interventions    Readmission Risk Interventions   No data to display

## 2022-06-21 ENCOUNTER — Telehealth: Payer: Self-pay

## 2022-06-21 NOTE — Telephone Encounter (Signed)
Transition Care Management Unsuccessful Follow-up Telephone Call  Date of discharge and from where:  10/18/3, [facility]  Attempts:  1st Attempt  Reason for unsuccessful TCM follow-up call:  Left voice message

## 2022-06-22 NOTE — Telephone Encounter (Signed)
Pt's mom stated daughter got a call from our office. She just wanted to let us know daughter is going to Rehab and will be there for 12 months. Mom is not on dpr.

## 2022-06-22 NOTE — Telephone Encounter (Signed)
Transition Care Management Unsuccessful Follow-up Telephone Call  Date of discharge and from where:  06/20/22, [facility]  Attempts:  2nd Attempt  Reason for unsuccessful TCM follow-up call:  Left voice message

## 2022-10-31 ENCOUNTER — Ambulatory Visit: Payer: Commercial Managed Care - HMO | Admitting: Family

## 2022-10-31 ENCOUNTER — Telehealth: Payer: Self-pay | Admitting: Family Medicine

## 2022-10-31 VITALS — BP 124/89 | HR 101 | Temp 97.9°F | Resp 16 | Ht 62.0 in | Wt 170.0 lb

## 2022-10-31 DIAGNOSIS — E669 Obesity, unspecified: Secondary | ICD-10-CM | POA: Diagnosis not present

## 2022-10-31 DIAGNOSIS — F1011 Alcohol abuse, in remission: Secondary | ICD-10-CM | POA: Diagnosis not present

## 2022-10-31 LAB — COMPREHENSIVE METABOLIC PANEL
ALT: 19 U/L (ref 0–35)
AST: 24 U/L (ref 0–37)
Albumin: 4.2 g/dL (ref 3.5–5.2)
Alkaline Phosphatase: 64 U/L (ref 39–117)
BUN: 14 mg/dL (ref 6–23)
CO2: 25 mEq/L (ref 19–32)
Calcium: 10.1 mg/dL (ref 8.4–10.5)
Chloride: 103 mEq/L (ref 96–112)
Creatinine, Ser: 0.72 mg/dL (ref 0.40–1.20)
GFR: 110.24 mL/min (ref >60.00)
Glucose, Bld: 78 mg/dL (ref 70–99)
Potassium: 4.2 mEq/L (ref 3.5–5.1)
Sodium: 138 mEq/L (ref 135–145)
Total Bilirubin: 0.6 mg/dL (ref 0.2–1.2)
Total Protein: 7.3 g/dL (ref 6.0–8.3)

## 2022-10-31 NOTE — Progress Notes (Signed)
Subjective:   By signing my name below, I, Jamey Reas, attest that this documentation has been prepared under the direction and in the presence of Debbrah Alar, NP. 10/31/2022   Patient ID: Cynthia Salazar, female    DOB: [birth year], 32 y.o.   MRN: [id number]  Chief Complaint  Patient presents with   Alcohol Problem    Patient reports not drinking in 4 months, in a a rehab facility   Obesity    Will like to discuss weight management    HPI Patient is in today for a follow-up appointment.   ED: She was admitted to the ED on 06/17/22 with complaints of nausea and vomiting from EtOH withdrawal.   Social History: She is currently not consuming alcohol. She is interested in getting her liver function tested.     Exercise: She walks 3 miles every day. She is interested in losing weight. The residence where she lives cooks the meals, but she is interested in eating healthier and controlling her portions.   Multivitamin: She is taking a multivitamin daily PO.   Pap smear: Last pap smear on 05/13/2018. Results are normal.   Past Medical History:  Diagnosis Date   Abnormal liver function test 03/18/2013   Alcohol use 06/03/2015   Allergy    Asthma    Chicken pox as a child   Depression with anxiety    Essential hypertension 06/03/2015   Insomnia 03/17/2013   Other and unspecified hyperlipidemia 03/18/2013    Past Surgical History:  Procedure Laterality Date   WISDOM TOOTH EXTRACTION  42 yrs    Family History  Problem Relation Age of Onset   Nephrolithiasis Father    Hypertension Father    Nephrolithiasis Brother    COPD Paternal Grandmother     Social History   Socioeconomic History   Marital status: Single    Spouse name: Not on file   Number of children: Not on file   Years of education: Not on file   Highest education level: Not on file  Occupational History   Not on file  Tobacco Use   Smoking status: Former   Smokeless tobacco: Never   Tobacco  comments:    occasionally smoked one  Vaping Use   Vaping Use: Never used  Substance and Sexual Activity   Alcohol use: Yes    Comment: 1 pint per day    Drug use: Yes    Types: Marijuana   Sexual activity: Yes    Partners: Female  Other Topics Concern   Not on file  Social History Narrative   Not on file   Social Determinants of Health   Financial Resource Strain: Not on file  Food Insecurity: Not on file  Transportation Needs: Not on file  Physical Activity: Not on file  Stress: Not on file  Social Connections: Not on file  Intimate Partner Violence: Not on file    Outpatient Medications Prior to Visit  Medication Sig Dispense Refill   folic acid (FOLVITE) 1 MG tablet Take 1 tablet (1 mg total) by mouth daily.     Multiple Vitamin (MULTIVITAMIN WITH MINERALS) TABS tablet Take 1 tablet by mouth daily.     thiamine (VITAMIN B-1) 100 MG tablet Take 1 tablet (100 mg total) by mouth daily.     No facility-administered medications prior to visit.    Allergies  Allergen Reactions   Aleve [Naproxen Sodium]     wheeze    ROS See HPI  Objective:    Physical Exam Constitutional:      General: She is not in acute distress.    Appearance: Normal appearance.  HENT:     Head: Normocephalic and atraumatic.     Right Ear: External ear normal.     Left Ear: External ear normal.  Eyes:     Extraocular Movements: Extraocular movements intact.     Pupils: Pupils are equal, round, and reactive to light.  Cardiovascular:     Rate and Rhythm: Normal rate and regular rhythm.     Heart sounds: Normal heart sounds. No murmur heard.    No gallop.  Pulmonary:     Effort: Pulmonary effort is normal. No respiratory distress.     Breath sounds: Normal breath sounds. No wheezing or rales.  Skin:    General: Skin is warm.  Neurological:     Mental Status: She is alert and oriented to person, place, and time.  Psychiatric:        Judgment: Judgment normal.     BP 124/89 (BP  Location: Right Arm, Patient Position: Sitting, Cuff Size: Small)   Pulse (!) 101   Temp 97.9 F (36.6 C) (Oral)   Resp 16   Ht '5\' 2"'$  (1.575 m)   Wt 170 lb (77.1 kg)   SpO2 98%   BMI 31.09 kg/m  Wt Readings from Last 3 Encounters:  10/31/22 170 lb (77.1 kg)  06/17/22 161 lb 2.5 oz (73.1 kg)  04/08/22 155 lb (70.3 kg)       Assessment & Plan:  History of alcohol abuse Assessment & Plan: Currently sober 4 months.  I commended her on this. She is doing well living in a sober house for the last 4 months.  Plan is to stay there for a full year.  She would like to repeat her LFT's as they were quite elevated during her last hospitalization prior to rehab.  Orders: -     Comprehensive metabolic panel  Obesity (BMI 30.0-34.9) Assessment & Plan: We discussed continuing her regular exercise- walks about 5 miles/day.  Also discussed reducing calories to 1200-1500/day.     30 minutes spent on today's visit. Time was spent counseling patient on weight loss and reviewing records.   I, Nance Pear, NP, personally preformed the services described in this documentation.  All medical record entries made by the scribe were at my direction and in my presence.  I have reviewed the chart and discharge instructions (if applicable) and agree that the record reflects my personal performance and is accurate and complete. 10/31/2022  Nance Pear, NP

## 2022-10-31 NOTE — Telephone Encounter (Signed)
Pt asked when her results come back if we could call her mother instead, as she is not going to have her phone by the time the results are in.

## 2022-10-31 NOTE — Telephone Encounter (Signed)
noted 

## 2022-10-31 NOTE — Assessment & Plan Note (Addendum)
Currently sober 4 months.  I commended her on this. She is doing well living in a sober house for the last 4 months.  Plan is to stay there for a full year.  She would like to repeat her LFT's as they were quite elevated during her last hospitalization prior to rehab.

## 2022-10-31 NOTE — Assessment & Plan Note (Deleted)
She is doing well living in a sober house for the last 4 months.  Plan is to stay there for a full year.  She would like to repeat her LFT's as they were quite elevated during her last hospitalization prior to rehab.e

## 2022-10-31 NOTE — Assessment & Plan Note (Signed)
We discussed continuing her regular exercise- walks about 5 miles/day.  Also discussed reducing calories to 1200-1500/day.

## 2022-10-31 NOTE — Telephone Encounter (Signed)
Please advise pt's mother that patient's liver function testing has returned to normal.

## 2022-11-01 NOTE — Telephone Encounter (Signed)
Results given to patient's mother, Cyntha Sunderman, as normal

## 2023-07-11 IMAGING — CT CT HEAD W/O CM
4 series · 16 of 47 positions shown, 18 images · non-contrast
Comparison: None.

CLINICAL DATA: Dizziness, persistent/recurrent, cardiac or vascular
cause suspected



[Series 2: head w o · axial · 0.41mm/px · z∈[-627,-507]mm · 7 of 32 slices shown, 9 images]
[im 4/32  brain]
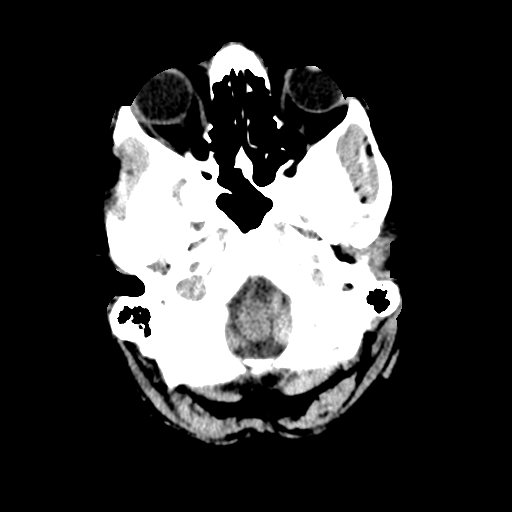
[im 4/32  bone]
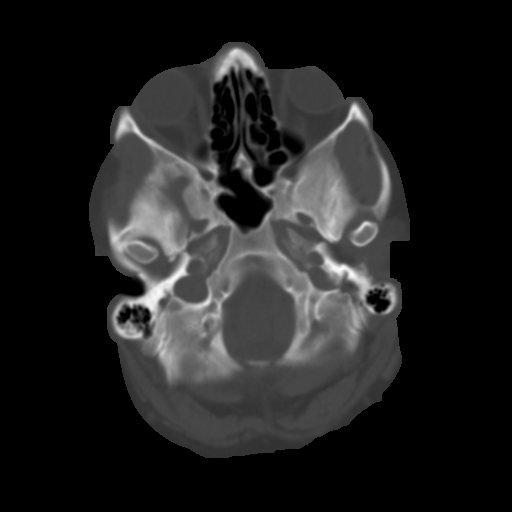
[im 8/32  brain]
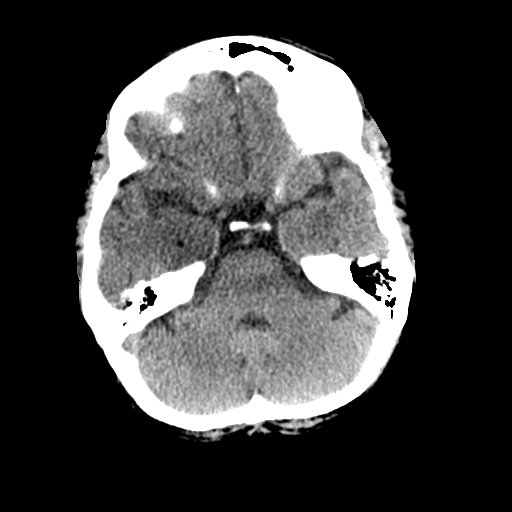
[im 12/32  brain]
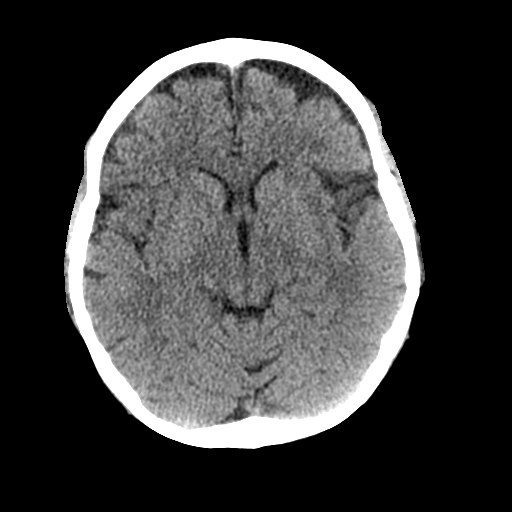
[im 16/32  brain]
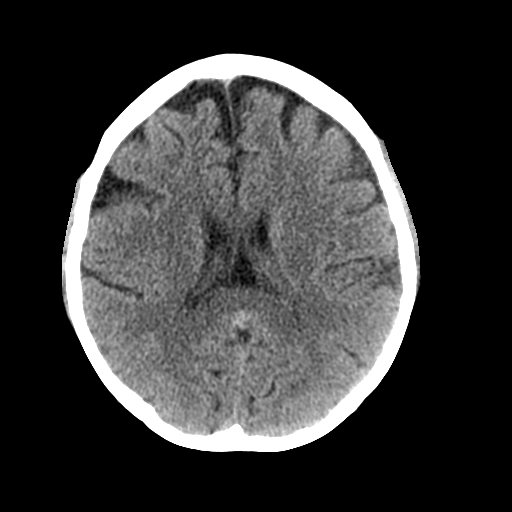
[im 20/32  brain]
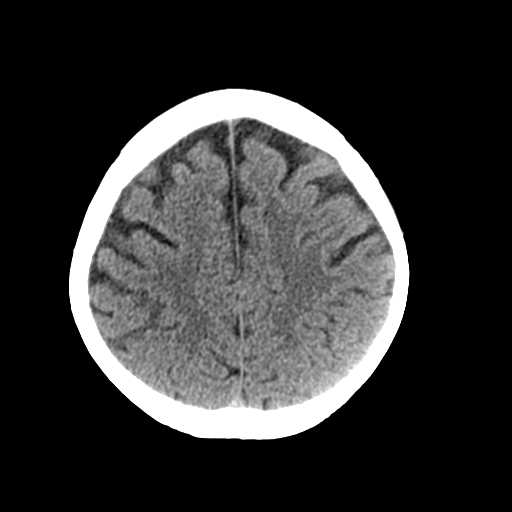
[im 20/32  bone]
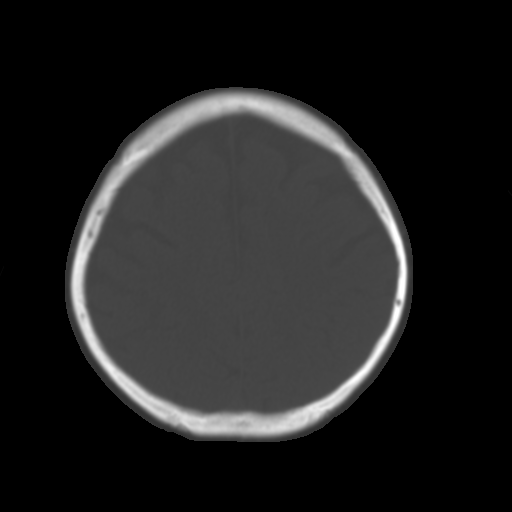
[im 24/32  brain]
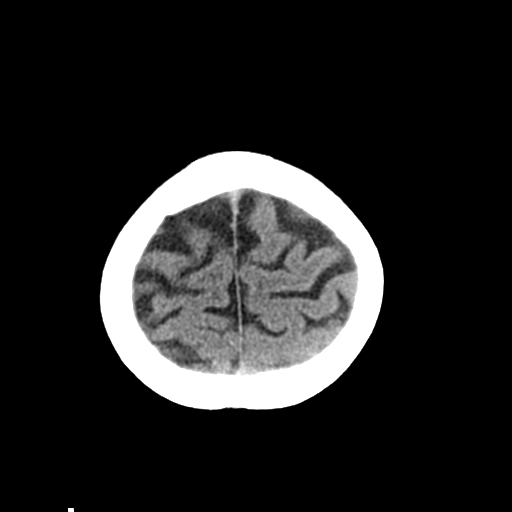
[im 28/32  brain]
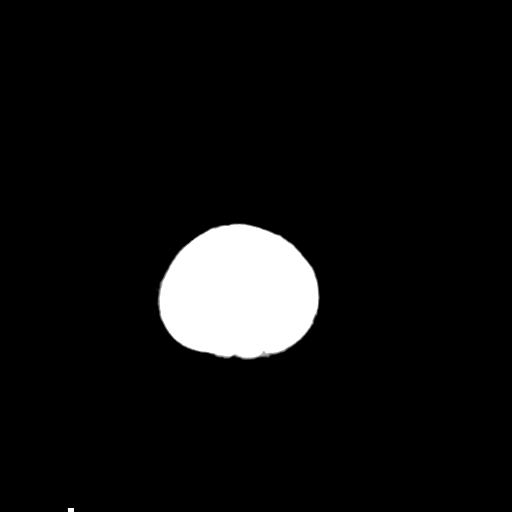

[Series 3: head bone · axial · 0.41mm/px · z∈[-628,-596]mm · 3 of 78 slices shown]
[im 8/78  bone]
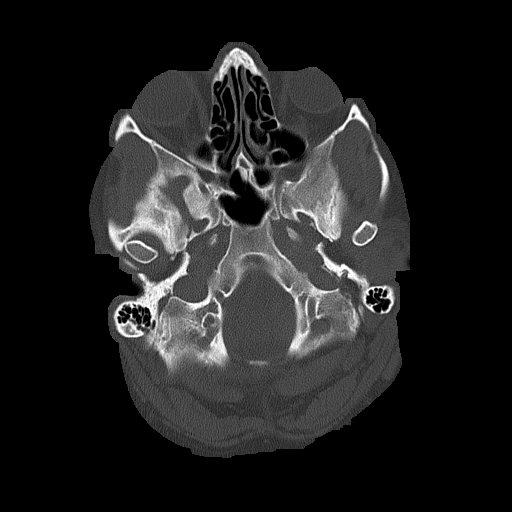
[im 16/78  bone]
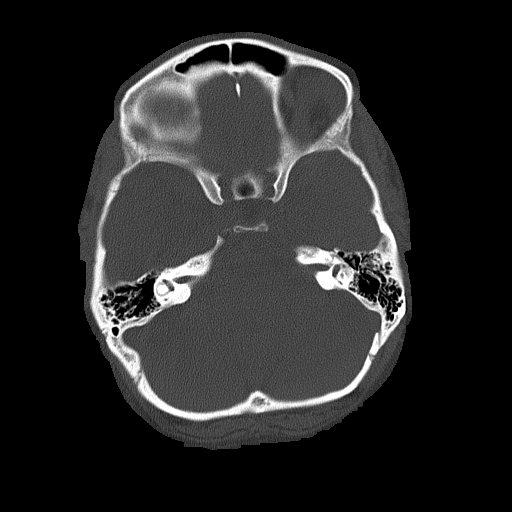
[im 24/78  bone]
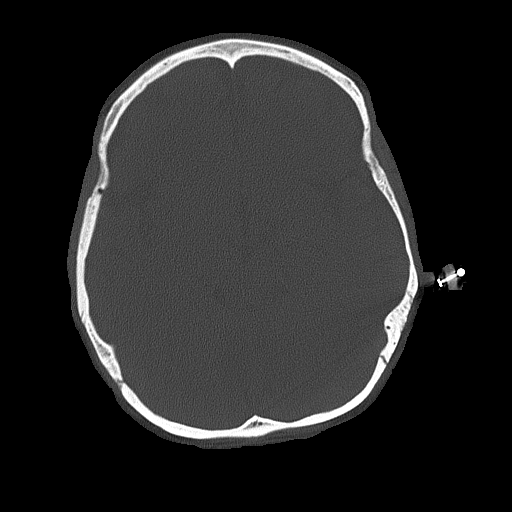

[Series 4: coronal soft · coronal · 0.32mm/px · 3 of 71 slices shown]
[im 24/71  brain]
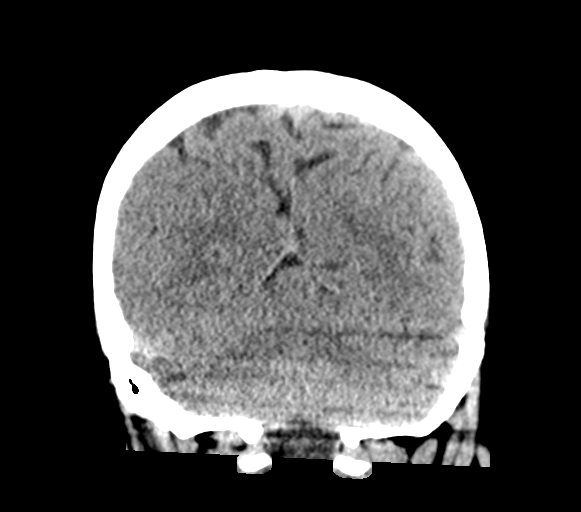
[im 32/71  brain]
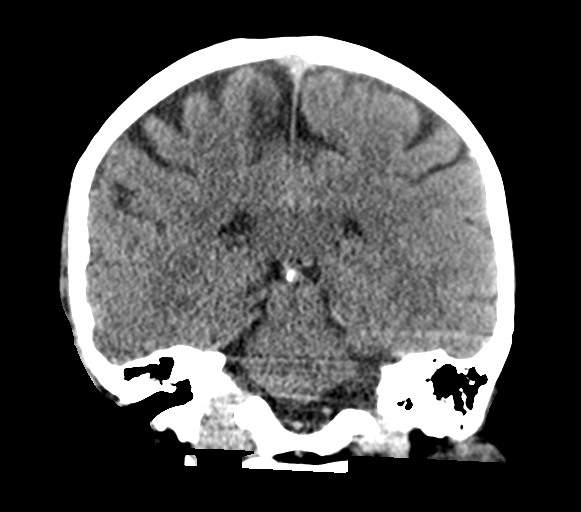
[im 39/71  brain]
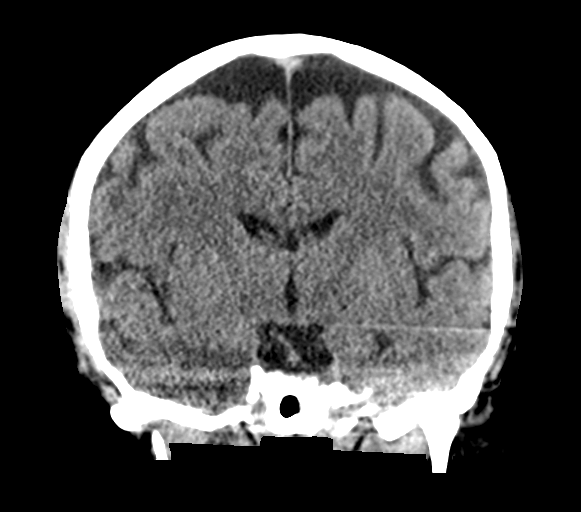

[Series 5: sagittal soft · sagittal · 0.32mm/px · 3 of 60 slices shown]
[im 20/60  brain]
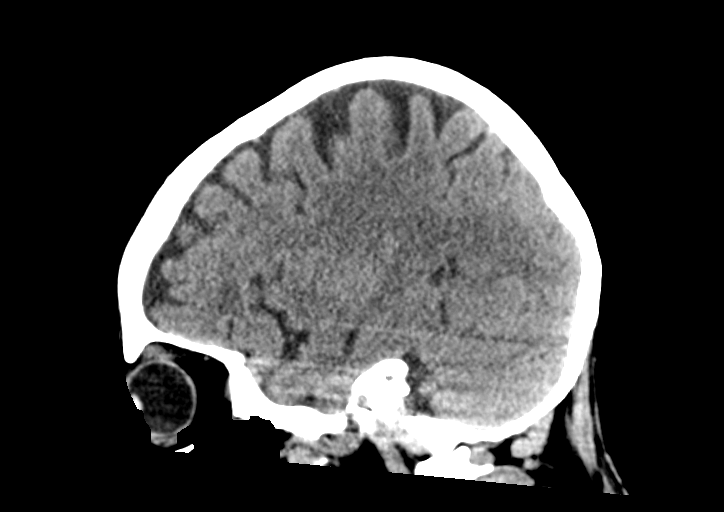
[im 30/60  brain]
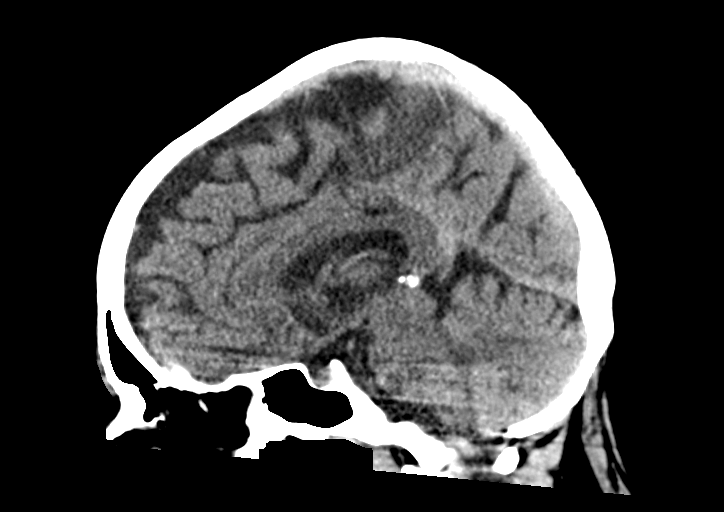
[im 40/60  brain]
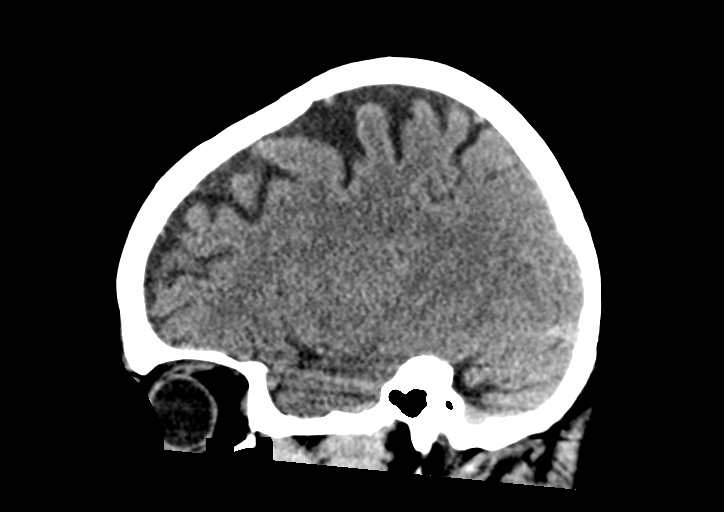

[16 of 47 positions shown; findings below may reference images not displayed]

FINDINGS: Brain: There is mild but age advanced atrophy. No intracranial
hemorrhage, mass effect, or midline shift. No hydrocephalus.
Incidental cavum septum pellucidum. The basilar cisterns are patent.
No evidence of territorial infarct or acute ischemia. No extra-axial
or intracranial fluid collection.

Vascular: No hyperdense vessel or unexpected calcification.

Skull: No fracture or focal lesion.

Sinuses/Orbits: Paranasal sinuses and mastoid air cells are clear.
The visualized orbits are unremarkable.

Other: None.
IMPRESSION: 1. No acute intracranial abnormality.
2. Mild but age advanced atrophy.

## 2023-08-04 ENCOUNTER — Inpatient Hospital Stay (HOSPITAL_COMMUNITY): Payer: Medicaid Other

## 2023-08-04 ENCOUNTER — Other Ambulatory Visit: Payer: Self-pay

## 2023-08-04 ENCOUNTER — Ambulatory Visit (HOSPITAL_COMMUNITY)
Admission: EM | Admit: 2023-08-04 | Discharge: 2023-08-04 | Disposition: A | Discharge status: Other Acute Inpatient Hospital | Payer: Medicaid Other

## 2023-08-04 ENCOUNTER — Inpatient Hospital Stay (HOSPITAL_COMMUNITY)
Admission: EM | Admit: 2023-08-04 | Discharge: 2023-08-07 | DRG: 897 | Disposition: A | Discharge status: Home / Self Care | Payer: Medicaid Other | Attending: Family Medicine | Admitting: Family Medicine

## 2023-08-04 ENCOUNTER — Encounter (HOSPITAL_COMMUNITY): Payer: Self-pay

## 2023-08-04 DIAGNOSIS — E876 Hypokalemia: Secondary | ICD-10-CM | POA: Diagnosis present

## 2023-08-04 DIAGNOSIS — E871 Hypo-osmolality and hyponatremia: Secondary | ICD-10-CM | POA: Diagnosis present

## 2023-08-04 DIAGNOSIS — F10939 Alcohol use, unspecified with withdrawal, unspecified: Secondary | ICD-10-CM | POA: Diagnosis not present

## 2023-08-04 DIAGNOSIS — K760 Fatty (change of) liver, not elsewhere classified: Secondary | ICD-10-CM | POA: Diagnosis present

## 2023-08-04 DIAGNOSIS — E162 Hypoglycemia, unspecified: Secondary | ICD-10-CM | POA: Diagnosis present

## 2023-08-04 DIAGNOSIS — E538 Deficiency of other specified B group vitamins: Secondary | ICD-10-CM | POA: Diagnosis present

## 2023-08-04 DIAGNOSIS — F419 Anxiety disorder, unspecified: Secondary | ICD-10-CM | POA: Diagnosis present

## 2023-08-04 DIAGNOSIS — E785 Hyperlipidemia, unspecified: Secondary | ICD-10-CM | POA: Diagnosis present

## 2023-08-04 DIAGNOSIS — I10 Essential (primary) hypertension: Secondary | ICD-10-CM | POA: Diagnosis present

## 2023-08-04 DIAGNOSIS — Z8249 Family history of ischemic heart disease and other diseases of the circulatory system: Secondary | ICD-10-CM | POA: Diagnosis not present

## 2023-08-04 DIAGNOSIS — F32A Depression, unspecified: Secondary | ICD-10-CM | POA: Diagnosis present

## 2023-08-04 DIAGNOSIS — Z825 Family history of asthma and other chronic lower respiratory diseases: Secondary | ICD-10-CM | POA: Diagnosis not present

## 2023-08-04 DIAGNOSIS — F10231 Alcohol dependence with withdrawal delirium: Principal | ICD-10-CM | POA: Diagnosis present

## 2023-08-04 DIAGNOSIS — E8729 Other acidosis: Secondary | ICD-10-CM | POA: Diagnosis present

## 2023-08-04 DIAGNOSIS — D6959 Other secondary thrombocytopenia: Secondary | ICD-10-CM | POA: Diagnosis present

## 2023-08-04 DIAGNOSIS — Z87891 Personal history of nicotine dependence: Secondary | ICD-10-CM | POA: Diagnosis not present

## 2023-08-04 DIAGNOSIS — F10931 Alcohol use, unspecified with withdrawal delirium: Secondary | ICD-10-CM

## 2023-08-04 DIAGNOSIS — K7010 Alcoholic hepatitis without ascites: Secondary | ICD-10-CM | POA: Diagnosis present

## 2023-08-04 DIAGNOSIS — R0609 Other forms of dyspnea: Secondary | ICD-10-CM | POA: Diagnosis not present

## 2023-08-04 DIAGNOSIS — F10229 Alcohol dependence with intoxication, unspecified: Secondary | ICD-10-CM | POA: Diagnosis present

## 2023-08-04 LAB — CBC WITH DIFFERENTIAL/PLATELET
Abs Immature Granulocytes: 0.05 10*3/uL (ref 0.00–0.07)
Basophils Absolute: 0.1 10*3/uL (ref 0.0–0.1)
Basophils Relative: 1 %
Eosinophils Absolute: 0 10*3/uL (ref 0.0–0.5)
Eosinophils Relative: 0 %
HCT: 43.3 % (ref 36.0–46.0)
Hemoglobin: 15 g/dL (ref 12.0–15.0)
Immature Granulocytes: 1 %
Lymphocytes Relative: 6 %
Lymphs Abs: 0.6 10*3/uL — ABNORMAL LOW (ref 0.7–4.0)
MCH: 29.4 pg (ref 26.0–34.0)
MCHC: 34.6 g/dL (ref 30.0–36.0)
MCV: 84.9 fL (ref 80.0–100.0)
Monocytes Absolute: 0.6 10*3/uL (ref 0.1–1.0)
Monocytes Relative: 5 %
Neutro Abs: 9.4 10*3/uL — ABNORMAL HIGH (ref 1.7–7.7)
Neutrophils Relative %: 87 %
Platelets: 200 10*3/uL (ref 150–400)
RBC: 5.1 MIL/uL (ref 3.87–5.11)
RDW: 14.2 % (ref 11.5–15.5)
WBC: 10.6 10*3/uL — ABNORMAL HIGH (ref 4.0–10.5)
nRBC: 0 % (ref 0.0–0.2)

## 2023-08-04 LAB — COMPREHENSIVE METABOLIC PANEL
ALT: 146 U/L — ABNORMAL HIGH (ref 0–44)
AST: 267 U/L — ABNORMAL HIGH (ref 15–41)
Albumin: 4.3 g/dL (ref 3.5–5.0)
Alkaline Phosphatase: 69 U/L (ref 38–126)
Anion gap: 22 — ABNORMAL HIGH (ref 5–15)
BUN: 12 mg/dL (ref 6–20)
CO2: 16 mmol/L — ABNORMAL LOW (ref 22–32)
Calcium: 8.1 mg/dL — ABNORMAL LOW (ref 8.9–10.3)
Chloride: 95 mmol/L — ABNORMAL LOW (ref 98–111)
Creatinine, Ser: 0.71 mg/dL (ref 0.44–1.00)
GFR, Estimated: >60 mL/min (ref >60)
Glucose, Bld: 105 mg/dL — ABNORMAL HIGH (ref 70–99)
Potassium: 3.6 mmol/L (ref 3.5–5.1)
Sodium: 133 mmol/L — ABNORMAL LOW (ref 135–145)
Total Bilirubin: 1.3 mg/dL — ABNORMAL HIGH (ref <1.2)
Total Protein: 7.6 g/dL (ref 6.5–8.1)

## 2023-08-04 LAB — MAGNESIUM: Magnesium: 2.1 mg/dL (ref 1.7–2.4)

## 2023-08-04 MED ORDER — SODIUM CHLORIDE 0.9 % IV SOLN: 260.0000 mg | Freq: Once | INTRAVENOUS | Status: DC

## 2023-08-04 MED ORDER — ENOXAPARIN SODIUM 40 MG/0.4ML IJ SOSY
40.0000 mg | PREFILLED_SYRINGE | INTRAMUSCULAR | Status: DC
  Administered 2023-08-04 – 2023-08-06 (×3): 40 mg via SUBCUTANEOUS
  Filled 2023-08-04 (×3): qty 0.4

## 2023-08-04 MED ORDER — LORAZEPAM 2 MG/ML IJ SOLN: 0.0000 mg | Freq: Three times a day (TID) | INTRAMUSCULAR | Status: DC

## 2023-08-04 MED ORDER — ADULT MULTIVITAMIN W/MINERALS CH
1.0000 | ORAL_TABLET | Freq: Every day | ORAL | Status: DC
  Administered 2023-08-04 – 2023-08-07 (×4): 1 via ORAL
  Filled 2023-08-04 (×4): qty 1

## 2023-08-04 MED ORDER — ACETAMINOPHEN 325 MG PO TABS
650.0000 mg | ORAL_TABLET | Freq: Four times a day (QID) | ORAL | Status: DC | PRN
  Administered 2023-08-06: 650 mg via ORAL
  Filled 2023-08-04: qty 2

## 2023-08-04 MED ORDER — THIAMINE HCL 100 MG/ML IJ SOLN
100.0000 mg | Freq: Every day | INTRAMUSCULAR | Status: DC
  Filled 2023-08-04 (×2): qty 2

## 2023-08-04 MED ORDER — SODIUM CHLORIDE 0.9 % IV SOLN: 1.0000 mg | Freq: Once | INTRAVENOUS | Status: DC

## 2023-08-04 MED ORDER — FOLIC ACID 1 MG PO TABS
1.0000 mg | ORAL_TABLET | Freq: Every day | ORAL | Status: DC
  Administered 2023-08-04 – 2023-08-07 (×4): 1 mg via ORAL
  Filled 2023-08-04 (×4): qty 1

## 2023-08-04 MED ORDER — THIAMINE HCL 100 MG/ML IJ SOLN
100.0000 mg | Freq: Once | INTRAMUSCULAR | Status: AC
  Administered 2023-08-04: 100 mg via INTRAVENOUS
  Filled 2023-08-04: qty 2

## 2023-08-04 MED ORDER — ONDANSETRON HCL 4 MG PO TABS: 4.0000 mg | ORAL_TABLET | Freq: Four times a day (QID) | ORAL | Status: DC | PRN

## 2023-08-04 MED ORDER — FOLIC ACID 5 MG/ML IJ SOLN
1.0000 mg | Freq: Once | INTRAMUSCULAR | Status: DC
  Filled 2023-08-04: qty 0.2

## 2023-08-04 MED ORDER — LORAZEPAM 2 MG/ML IJ SOLN
0.0000 mg | INTRAMUSCULAR | Status: DC
  Administered 2023-08-04: 2 mg via INTRAVENOUS
  Administered 2023-08-05: 1 mg via INTRAVENOUS
  Filled 2023-08-04 (×2): qty 1

## 2023-08-04 MED ORDER — ONDANSETRON HCL 4 MG/2ML IJ SOLN
4.0000 mg | Freq: Four times a day (QID) | INTRAMUSCULAR | Status: DC | PRN
  Administered 2023-08-04 – 2023-08-05 (×2): 4 mg via INTRAVENOUS
  Filled 2023-08-04 (×2): qty 2

## 2023-08-04 MED ORDER — PHENOBARBITAL SODIUM 130 MG/ML IJ SOLN
130.0000 mg | INTRAMUSCULAR | Status: AC | PRN
  Administered 2023-08-04 – 2023-08-05 (×3): 130 mg via INTRAVENOUS
  Filled 2023-08-04 (×3): qty 1

## 2023-08-04 MED ORDER — LACTATED RINGERS IV BOLUS
1000.0000 mL | Freq: Once | INTRAVENOUS | Status: AC
  Administered 2023-08-04: 1000 mL via INTRAVENOUS

## 2023-08-04 MED ORDER — ACETAMINOPHEN 650 MG RE SUPP: 650.0000 mg | Freq: Four times a day (QID) | RECTAL | Status: DC | PRN

## 2023-08-04 MED ORDER — THIAMINE MONONITRATE 100 MG PO TABS
100.0000 mg | ORAL_TABLET | Freq: Every day | ORAL | Status: DC
Start: 2023-08-04 — End: 2023-08-07
  Administered 2023-08-04 – 2023-08-07 (×4): 100 mg via ORAL
  Filled 2023-08-04 (×4): qty 1

## 2023-08-04 MED ORDER — OXYCODONE HCL 5 MG PO TABS: 5.0000 mg | ORAL_TABLET | ORAL | Status: DC | PRN

## 2023-08-04 MED ORDER — SODIUM CHLORIDE 0.9 % IV SOLN
260.0000 mg | Freq: Once | INTRAVENOUS | Status: AC
  Administered 2023-08-04: 260 mg via INTRAVENOUS
  Filled 2023-08-04: qty 2

## 2023-08-04 NOTE — ED Notes (Signed)
Patient transferred to [facility] ED via EMS due to tachycardia and elevate blood pressure

## 2023-08-04 NOTE — ED Provider Notes (Signed)
[place] EMERGENCY DEPARTMENT AT [facility] Provider Note   CSN: [id number] Arrival date & time: 08/04/23  1647     History Chief Complaint  Patient presents with   Alcohol Intoxication    HPI Cynthia Salazar is a 32 y.o. female presenting for chief complaint of withdrawal.  She is a 32 year old female has been drinking 16-32 drinks every day for the past few months decided to quit this morning.  Last drink at approximately 5 AM this morning says that she has no more at home and wanted to quit. Denies fevers chills nausea vomiting syncope shortness of breath.  Feels very tremulous has a headache.  History of withdrawal seizures.  Sent over from behavioral health urgent care due to degree of withdrawal symptoms CIWA predicted above 20 per their initial evaluation..   Patient's recorded medical, surgical, social, medication list and allergies were reviewed in the Snapshot window as part of the initial history.   Review of Systems   Review of Systems  Constitutional:  Positive for fatigue. Negative for chills and fever.  HENT:  Negative for ear pain and sore throat.   Eyes:  Negative for pain and visual disturbance.  Respiratory:  Negative for cough and shortness of breath.   Cardiovascular:  Negative for chest pain and palpitations.  Gastrointestinal:  Negative for abdominal pain and vomiting.  Genitourinary:  Negative for dysuria and hematuria.  Musculoskeletal:  Negative for arthralgias and back pain.  Skin:  Negative for color change and rash.  Neurological:  Positive for tremors and headaches. Negative for seizures and syncope.  Psychiatric/Behavioral:  Positive for agitation.   All other systems reviewed and are negative.   Physical Exam Updated Vital Signs BP (!) 145/111   Pulse (!) 122   Temp 98.8 F (37.1 C)   Resp 18   Ht 5\' 2"  (1.575 m)   Wt 72.6 kg   LMP 08/04/2023   SpO2 97%   BMI 29.26 kg/m  Physical Exam Vitals and nursing note reviewed.   Constitutional:      General: She is not in acute distress.    Appearance: She is well-developed.  HENT:     Head: Normocephalic and atraumatic.  Eyes:     Conjunctiva/sclera: Conjunctivae normal.  Cardiovascular:     Rate and Rhythm: Normal rate and regular rhythm.     Heart sounds: No murmur heard. Pulmonary:     Effort: Pulmonary effort is normal. No respiratory distress.     Breath sounds: Normal breath sounds.  Abdominal:     Palpations: Abdomen is soft.     Tenderness: There is no abdominal tenderness.  Musculoskeletal:        General: No swelling.     Cervical back: Neck supple.  Skin:    General: Skin is warm and dry.     Capillary Refill: Capillary refill takes less than 2 seconds.  Neurological:     Mental Status: She is alert.     Comments: Very anxious, very tremulous.  Slow to follow directions.  No gross asterixis on exam but does have obvious hand tremors.  Psychiatric:        Mood and Affect: Mood normal.      ED Course/ Medical Decision Making/ A&P Clinical Course as of 08/04/23 2138  Wynelle Link Aug 04, 2023  1754 Patient very disoriented, requiring very frequent redirection.  Has taken out her IV but is able to be verbally de-escalated. [CC]    Clinical Course User Index [CC]  Glyn Ade, MD    Procedures .Critical Care  Performed by: Glyn Ade, MD Authorized by: Glyn Ade, MD   Critical care provider statement:    Critical care time (minutes):  30   Critical care was necessary to treat or prevent imminent or life-threatening deterioration of the following conditions:  CNS failure or compromise (ETOH withdrawal requiring phenobarb and CIWA >12)   Critical care was time spent personally by me on the following activities:  Development of treatment plan with patient or surrogate, discussions with consultants, evaluation of patient's response to treatment, examination of patient, ordering and review of laboratory studies, ordering and  review of radiographic studies, ordering and performing treatments and interventions, pulse oximetry, re-evaluation of patient's condition and review of old charts   Care discussed with: admitting provider      Medications Ordered in ED Medications  PHENObarbital (LUMINAL) injection 130 mg (130 mg Intravenous Given 08/04/23 2110)  PHENObarbital (LUMINAL) 260 mg in sodium chloride 0.9 % 100 mL IVPB (0 mg Intravenous Stopped 08/04/23 1907)  lactated ringers bolus 1,000 mL (1,000 mLs Intravenous New Bag/Given 08/04/23 1736)  thiamine (VITAMIN B1) injection 100 mg (100 mg Intravenous Given 08/04/23 1737)    Medical Decision Making:    Cynthia Salazar is a 32 y.o. female who presented to the ED today with alcohol withdrawal symptoms detailed above.     Additional history discussed with patient's family/caregivers.  Patient placed on continuous vitals and telemetry monitoring while in ED which was reviewed periodically.   Complete initial physical exam performed, notably the patient  was hypertensive, tachycardic and tremulous consistent with acute alcohol withdrawals.      Reviewed and confirmed nursing documentation for past medical history, family history, social history.    Initial Assessment:   Patient's history of present illness and physical exam findings are most consistent with severe alcohol withdrawal.  Initial CIWA approximately 20 on my evaluation. Will start on a phenobarbital load and titrate to effect here in the emergency room and anticipate stepdown versus ICU admission to depending on degree of response.  Initial Plan:  Phenobarbital load as well as thiamine load Screening labs including CBC and Metabolic panel to evaluate for infectious or metabolic etiology of disease. EKG to evaluate for cardiac pathology. Objective evaluation as below reviewed with plan for close reassessment  Initial Study Results:   Laboratory  All laboratory results reviewed without evidence of  clinically relevant pathology.     EKG EKG was reviewed independently. Rate, rhythm, axis, intervals all examined and without medically relevant abnormality. ST segments without concerns for elevations.    Reassessment and Plan:   On second reassessment, CIWA improved, approximately 16 at this time.  Will redosed the phenobarb.   Reassessment: On third reassessment after third dose of phenobarbital patient is down to CIWA 8 and resting comfortably.  Heart rate down to 112.  Will arrange for admission for further care and management.   Disposition:   Based on the above findings, I believe this patient is stable for admission.    Patient/family educated about specific findings on our evaluation and explained exact reasons for admission.  Patient/family educated about clinical situation and time was allowed to answer questions.   Admission team communicated with and agreed with need for admission. Patient admitted. Patient  ready to move at this time.     Emergency Department Medication Summary:   Medications  PHENObarbital (LUMINAL) injection 130 mg (130 mg Intravenous Given 08/04/23 2110)  PHENObarbital (LUMINAL) 260 mg  in sodium chloride 0.9 % 100 mL IVPB (0 mg Intravenous Stopped 08/04/23 1907)  lactated ringers bolus 1,000 mL (1,000 mLs Intravenous New Bag/Given 08/04/23 1736)  thiamine (VITAMIN B1) injection 100 mg (100 mg Intravenous Given 08/04/23 1737)         Clinical Impression:  1. Alcohol withdrawal syndrome with complication (HCC)      Admit   Final Clinical Impression(s) / ED Diagnoses Final diagnoses:  Alcohol withdrawal syndrome with complication [facility])    Rx / DC Orders ED Discharge Orders     None         Glyn Ade, MD 08/04/23 2142

## 2023-08-04 NOTE — ED Notes (Signed)
Pt reports 1 episode of vomiting.

## 2023-08-04 NOTE — ED Notes (Signed)
Nurse found pt wondering halls stating she needs help with getting IV fluids hooked back up. Pt was escorted back to her room and advised by nurse it is safer to stay in bed. Pt stated she feels like vomiting, then proceeds to drink water. Nurse removed cup of water out of room. MD made aware,

## 2023-08-04 NOTE — H&P (Signed)
History and Physical    Cynthia Salazar WJX:[id number] DOB: 29-Jul-1991 DOA: 08/04/2023  PCP: Bradd Canary, MD   Chief Complaint:  alcohol withdrawal  HPI: Cynthia Salazar is a 32 y.o. female with medical history significant of alcohol abuse, hypertension, depression who presents emergency with alcohol withdrawal symptoms.  Patient drinks approximately 30-35 drinks per day.  Last drink was this morning at 5 AM and has since not had any alcoholic drinks.  She presented to the ER due to withdrawal and desire to stop drinking.  Her CIWA scores were noted to be up to 30.  On arrival she was tachycardic and hemodynamically stable.  She was hallucinating and agitated.  Labs were obtained which showed WBC 10.6, hemoglobin 15, sodium 133, bicarb 16, AST 267, ALT 146, magnesium 2.1.  Patient was loaded with phenobarbital with improvement in CIWA scores to 8.  She was admitted for further workup.  On evaluation patient had received 3 doses of phenobarbital and was notably somnolent.  She denied any infectious complaints including fever or chills.  She expressed desire to stop drinking.   Review of Systems: Review of Systems  Constitutional: Negative.  Negative for chills and fever.  HENT: Negative.    Eyes: Negative.   Respiratory: Negative.    Cardiovascular: Negative.   Gastrointestinal: Negative.   Genitourinary: Negative.   Musculoskeletal: Negative.   Skin: Negative.   Neurological: Negative.   Endo/Heme/Allergies: Negative.   Psychiatric/Behavioral:  Positive for hallucinations.      As per HPI otherwise 10 point review of systems negative.   Allergies  Allergen Reactions   Aleve [Naproxen Sodium]     wheeze    Past Medical History:  Diagnosis Date   Abnormal liver function test 03/18/2013   Alcohol use 06/03/2015   Allergy    Asthma    Chicken pox as a child   Depression with anxiety    Essential hypertension 06/03/2015   Insomnia 03/17/2013   Other and unspecified  hyperlipidemia 03/18/2013    Past Surgical History:  Procedure Laterality Date   WISDOM TOOTH EXTRACTION  19 yrs     reports that she has quit smoking. She has never used smokeless tobacco. She reports current alcohol use. She reports current drug use. Drug: Marijuana.  Family History  Problem Relation Age of Onset   Nephrolithiasis Father    Hypertension Father    Nephrolithiasis Brother    COPD Paternal Grandmother     Prior to Admission medications   Not on File    Physical Exam: Vitals:   08/04/23 1830 08/04/23 1900 08/04/23 2104 08/04/23 2104  BP: (!) 140/102 (!) 135/95 (!) 145/111 (!) 145/111  Pulse: (!) 149 (!) 131  (!) 122  Resp: 16 16 20 18   Temp:    98.8 F (37.1 C)  TempSrc:      SpO2: 96% 97% 97%   Weight:      Height:       Physical Exam Vitals reviewed.  Constitutional:      Appearance: She is normal weight.  HENT:     Nose: Nose normal.     Mouth/Throat:     Mouth: Mucous membranes are moist.     Pharynx: Oropharynx is clear.  Eyes:     Conjunctiva/sclera: Conjunctivae normal.     Pupils: Pupils are equal, round, and reactive to light.  Cardiovascular:     Rate and Rhythm: Regular rhythm. Tachycardia present.     Pulses: Normal pulses.     Heart  sounds: Normal heart sounds.  Pulmonary:     Effort: Pulmonary effort is normal.     Breath sounds: Normal breath sounds.  Abdominal:     General: Abdomen is flat. Bowel sounds are normal.  Musculoskeletal:        General: Normal range of motion.     Cervical back: Normal range of motion.  Skin:    General: Skin is warm.     Capillary Refill: Capillary refill takes less than 2 seconds.  Neurological:     Mental Status: She is alert. She is disoriented.        Labs on Admission: I have personally reviewed the patients's labs and imaging studies.  Assessment/Plan Principal Problem:   Alcohol withdrawal (HCC)   # Severe alcohol withdrawal - Patient high risk for delirium tremens due to  volume of alcohol consumption and being within 24 hours of last drink with severe alcohol withdrawal symptoms - Hallucinating - Tachycardic -hypertensive -30+ drinks per day  Plan: Patient received phenobarbital load. Will place on CIWA protocol with as needed Ativan If patient develops high CIWA levels on Ativan will continue phenobarb  Will admit to stepdown due to severity If patient begins to develop refractory withdrawal symptoms may need escalation to ICU  # Metabolic acidosis-likely related to ketoacidosis in setting of alcohol usage.  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  Will continue to monitor.  Patient will need retrogressed ultrasound and hepatitis panel  # Hyponatremia-given IV fluids    Admission status: Inpatient Stepdown  Certification: The appropriate patient status for this patient is INPATIENT. Inpatient status is judged to be reasonable and necessary in order to provide the required intensity of service to ensure the patient's safety. The patient's presenting symptoms, physical exam findings, and initial radiographic and laboratory data in the context of their chronic comorbidities is felt to place them at high risk for further clinical deterioration. Furthermore, it is not anticipated that the patient will be medically stable for discharge from the hospital within 2 midnights of admission.   * I certify that at the point of admission it is my clinical judgment that the patient will require inpatient hospital care spanning beyond 2 midnights from the point of admission due to high intensity of service, high risk for further deterioration and high frequency of surveillance required.Alan Mulder MD [facility] If 7PM-7AM, please contact night-coverage [email or web address]  08/04/2023, 9:45 PM

## 2023-08-04 NOTE — ED Triage Notes (Addendum)
Pt BIB [facility] from [facility] and presents with ETOH withdrawal. Pt has been drinking bourbon x 1 week with little to no food or water intake. Pt has hx of needing medical detox. Pt's last drink was this AM.   EMS Vitals   CBG 112 HR 150 148/100

## 2023-08-04 NOTE — Progress Notes (Signed)
   08/04/23 1535  [facility] Triage Screening (Walk-ins at [facility] only)  How Did You Hear About [place]? Family/Friend  What Is the Reason for Your Visit/Call Today? Patient is a 32 year old female who presents voluntarily to [facility] due to withdrawal from alcohol. (Bourbon). The patient is accompanied by her accountability partner Keesha from [employer] in [place]. Patient just graduated 10/31 from a year-long program for [employer]. Patient started drinking when she was 14. Patient denies having any SI/HI/ or AVH. She endorses drinking as recently as this morning an unknown amount of bourbon. Patient denies using any other substances.  How Long Has This Been Causing You Problems? <Week  Have You Recently Had Any Thoughts About Hurting Yourself? No  Are You Planning to Commit Suicide/Harm Yourself At This time? No  Have you Recently Had Thoughts About Hurting Someone [name]? No  Are You Planning To Harm Someone At This Time? No  Explanation: N/A  Physical Abuse Denies  Verbal Abuse Denies  Sexual Abuse Denies  Exploitation of patient/patient's resources Denies  Self-Neglect Yes, present (Comment) (Patient does not shower for days when she is drinking)  Possible abuse reported to: Other (Comment) (Pt denies abuse)  Are you currently experiencing any auditory, visual or other hallucinations? No  Have You Used Any Alcohol or Drugs in the Past 24 Hours? Yes  How long ago did you use Drugs or Alcohol? this morning  What Did You Use and How Much? Bourbon/ unknown amount  Do you have any current medical co-morbidities that require immediate attention? No  Clinician description of patient physical appearance/behavior: Disheveled, anxious  What Do You Feel Would Help You the Most Today? Alcohol or Drug Use Treatment  If access to [facility]gent [facility] was not available, would you have sought care in the Emergency Department? Yes  Determination of Need Urgent (48 hours)  Options For Referral  Facility-Based Crisis  Determination of Need filed? Yes

## 2023-08-04 NOTE — ED Provider Notes (Signed)
[facility] Medical Screening Exam  Patient Name: Cynthia Salazar MRN: [id number] Date of Evaluation: 08/04/23 Chief Complaint:  "I want help detoxing" Diagnosis:  Final diagnoses:  Alcohol withdrawal syndrome with complication (HCC)    History of Present illness: Cynthia Salazar 32 y.o., female patient presented to [facility] as a walk in accompanied by her friend and accountability partner, Myrta, requesting help with detoxing from alcohol.  Patient has a history of alcohol abuse since she was 32 years old.  Her longest period of sobriety was 18 months.  She has a history of depression, anxiety, HTN, asthma and insomia.  Albertina Parr, 32 y.o., female patient seen face to face by this provider, consulted with Dr. Dairl Ponder; and chart reviewed on 08/04/23.  On evaluation Cynthia Salazar is shakey and states she is "feeling some pressure in my chest".  She is very fidgety in her seat; she looks uncomfortable. She says her last drink was this morning.  She has been drinking a pint of liquor daily.  Per chart review, patient was in ICU/Stepdown last time she went through detox. She has elevated blood pressure (151/111) and heart rate (151bpm).  She is being sent to [facility] long via EMS for medical clearance. Report provided to Dr Doran Durand at [facility] ED that patient is being sent over.     During evaluation Cynthia Salazar is seated ina chair in moderate distress. She is alert, oriented x 4 and cooperative.  Her mood is depressed with congruent affect.  She has normal speech, and behavior.  Objectively there is no evidence of psychosis/mania or delusional thinking.  Patient is able to converse coherently, goal directed thoughts, no distractibility, or pre-occupation.  She also denies suicidal/self-harm/homicidal ideation, psychosis, and paranoia.  Patient answered questions appropriately.    Patient meets criteria for inpatient detox and substance abuse treatment.  She is  being sent to [facility] ED for medical clearance.  Flowsheet Row ED from 08/04/2023 in [facility] ED to Hosp-Admission (Discharged) from 06/17/2022 in [place] LONG 4TH FLOOR PROGRESSIVE CARE AND UROLOGY ED from 06/16/2022 in [facility] Emergency Department at [facility]  C-SSRS RISK CATEGORY No Risk No Risk No Risk       Psychiatric Specialty Exam  Presentation  General Appearance:Disheveled  Eye Contact:Fair  Speech:Clear and Coherent; Normal Rate  Speech Volume:Normal  Handedness:Right   Mood and Affect  Mood: Depressed  Affect: Congruent   Thought Process  Thought Processes: Coherent  Descriptions of Associations:Intact  Orientation:Full (Time, Place and Person)  Thought Content:Logical  Diagnosis of Schizophrenia or Schizoaffective disorder in past: No data recorded  Hallucinations:None  Ideas of Reference:None  Suicidal Thoughts:No  Homicidal Thoughts:No   Sensorium  Memory: Immediate Fair; Recent Fair; Remote Fair  Judgment: Impaired  Insight: Poor   Executive Functions  Concentration: Fair  Attention Span: Fair  Recall: [employer] of Knowledge: Fair  Language: Fair   Psychomotor Activity  Psychomotor Activity: Normal   Assets  Assets: Manufacturing systems engineer; Desire for Improvement; Physical Health; Resilience   Sleep  Sleep: Fair  Number of hours:  5   Physical Exam: Physical Exam Eyes:     Pupils: Pupils are equal, round, and reactive to light.  Pulmonary:     Effort: Pulmonary effort is normal.  Skin:    General: Skin is dry.  Neurological:     Mental Status: She is alert and oriented to person, place, and time.    Review of Systems  Cardiovascular:  Positive for palpitations.  Neurological:  Positive for tremors.  Psychiatric/Behavioral:  Positive for depression and substance abuse. The patient is nervous/anxious.   All other systems reviewed and are  negative.  Blood pressure (!) 151/111, pulse (!) 151, temperature 98.5 F (36.9 C), resp. rate 18, SpO2 97%. There is no height or weight on file to calculate BMI.  Musculoskeletal: Strength & Muscle Tone: within normal limits Gait & Station: normal Patient leans: N/A   [facility] MSE Discharge Disposition for Follow up and Recommendations: Based on my evaluation the patient appears to have an emergency medical condition for which I recommend the patient be transferred to the emergency department for further evaluation.    Thomes Lolling, NP 08/04/2023, 3:57 PM

## 2023-08-05 ENCOUNTER — Inpatient Hospital Stay (HOSPITAL_COMMUNITY): Payer: Medicaid Other

## 2023-08-05 DIAGNOSIS — F10939 Alcohol use, unspecified with withdrawal, unspecified: Secondary | ICD-10-CM

## 2023-08-05 LAB — PROTIME-INR
INR: 1.1 (ref 0.8–1.2)
Prothrombin Time: 13.9 s (ref 11.4–15.2)

## 2023-08-05 LAB — CBC
HCT: 38.8 % (ref 36.0–46.0)
Hemoglobin: 13.3 g/dL (ref 12.0–15.0)
MCH: 29.8 pg (ref 26.0–34.0)
MCHC: 34.3 g/dL (ref 30.0–36.0)
MCV: 86.8 fL (ref 80.0–100.0)
Platelets: 121 10*3/uL — ABNORMAL LOW (ref 150–400)
RBC: 4.47 MIL/uL (ref 3.87–5.11)
RDW: 13.9 % (ref 11.5–15.5)
WBC: 5.7 10*3/uL (ref 4.0–10.5)
nRBC: 0 % (ref 0.0–0.2)

## 2023-08-05 LAB — TECHNOLOGIST SMEAR REVIEW: Plt Morphology: NORMAL

## 2023-08-05 LAB — HEPATIC FUNCTION PANEL
ALT: 119 U/L — ABNORMAL HIGH (ref 0–44)
AST: 199 U/L — ABNORMAL HIGH (ref 15–41)
Albumin: 4 g/dL (ref 3.5–5.0)
Alkaline Phosphatase: 60 U/L (ref 38–126)
Bilirubin, Direct: 0.4 mg/dL — ABNORMAL HIGH (ref 0.0–0.2)
Indirect Bilirubin: 0.8 mg/dL (ref 0.3–0.9)
Total Bilirubin: 1.2 mg/dL — ABNORMAL HIGH (ref <1.2)
Total Protein: 6.8 g/dL (ref 6.5–8.1)

## 2023-08-05 LAB — MRSA NEXT GEN BY PCR, NASAL: MRSA by PCR Next Gen: NOT DETECTED

## 2023-08-05 LAB — BASIC METABOLIC PANEL
Anion gap: 15 (ref 5–15)
BUN: 11 mg/dL (ref 6–20)
CO2: 20 mmol/L — ABNORMAL LOW (ref 22–32)
Calcium: 8.3 mg/dL — ABNORMAL LOW (ref 8.9–10.3)
Chloride: 93 mmol/L — ABNORMAL LOW (ref 98–111)
Creatinine, Ser: 0.71 mg/dL (ref 0.44–1.00)
GFR, Estimated: >60 mL/min (ref >60)
Glucose, Bld: 58 mg/dL — ABNORMAL LOW (ref 70–99)
Potassium: 3.7 mmol/L (ref 3.5–5.1)
Sodium: 128 mmol/L — ABNORMAL LOW (ref 135–145)

## 2023-08-05 LAB — DIFFERENTIAL
Abs Immature Granulocytes: 0 10*3/uL (ref 0.00–0.07)
Basophils Absolute: 0.1 10*3/uL (ref 0.0–0.1)
Basophils Relative: 1 %
Eosinophils Absolute: 0 10*3/uL (ref 0.0–0.5)
Eosinophils Relative: 0 %
Immature Granulocytes: 0 %
Lymphocytes Relative: 17 %
Lymphs Abs: 1 10*3/uL (ref 0.7–4.0)
Monocytes Absolute: 0.2 10*3/uL (ref 0.1–1.0)
Monocytes Relative: 4 %
Neutro Abs: 4.4 10*3/uL (ref 1.7–7.7)
Neutrophils Relative %: 78 %

## 2023-08-05 LAB — HEPATITIS PANEL, ACUTE
HCV Ab: NONREACTIVE
Hep A IgM: NONREACTIVE
Hep B C IgM: NONREACTIVE
Hepatitis B Surface Ag: NONREACTIVE

## 2023-08-05 LAB — TSH: TSH: 6.007 u[IU]/mL — ABNORMAL HIGH (ref 0.350–4.500)

## 2023-08-05 LAB — VITAMIN B12: Vitamin B-12: 394 pg/mL (ref 180–914)

## 2023-08-05 LAB — GLUCOSE, CAPILLARY
Glucose-Capillary: 85 mg/dL (ref 70–99)
Glucose-Capillary: 86 mg/dL (ref 70–99)
Glucose-Capillary: 93 mg/dL (ref 70–99)

## 2023-08-05 LAB — D-DIMER, QUANTITATIVE: D-Dimer, Quant: 0.76 ug{FEU}/mL — ABNORMAL HIGH (ref 0.00–0.50)

## 2023-08-05 LAB — FOLATE: Folate: >40 ng/mL (ref >5.9)

## 2023-08-05 LAB — HCG, QUANTITATIVE, PREGNANCY: hCG, Beta Chain, Quant, S: <1 m[IU]/mL (ref <5)

## 2023-08-05 MED ORDER — LOPERAMIDE HCL 2 MG PO CAPS: 2.0000 mg | ORAL_CAPSULE | ORAL | Status: DC | PRN

## 2023-08-05 MED ORDER — CHLORHEXIDINE GLUCONATE CLOTH 2 % EX PADS
6.0000 | MEDICATED_PAD | Freq: Every day | CUTANEOUS | Status: DC
  Administered 2023-08-05 – 2023-08-06 (×2): 6 via TOPICAL

## 2023-08-05 MED ORDER — CHLORDIAZEPOXIDE HCL 25 MG PO CAPS: 25.0000 mg | ORAL_CAPSULE | ORAL | Status: DC

## 2023-08-05 MED ORDER — PROCHLORPERAZINE EDISYLATE 10 MG/2ML IJ SOLN: 5.0000 mg | Freq: Once | INTRAMUSCULAR | Status: DC | PRN

## 2023-08-05 MED ORDER — HYDROXYZINE HCL 25 MG PO TABS: 25.0000 mg | ORAL_TABLET | Freq: Four times a day (QID) | ORAL | Status: DC | PRN

## 2023-08-05 MED ORDER — ORAL CARE MOUTH RINSE: 15.0000 mL | OROMUCOSAL | Status: DC | PRN

## 2023-08-05 MED ORDER — DEXTROSE-SODIUM CHLORIDE 5-0.9 % IV SOLN: INTRAVENOUS | Status: AC

## 2023-08-05 MED ORDER — LORAZEPAM 1 MG PO TABS
1.0000 mg | ORAL_TABLET | ORAL | Status: DC | PRN
  Administered 2023-08-06 (×2): 1 mg via ORAL
  Filled 2023-08-05 (×2): qty 1

## 2023-08-05 MED ORDER — CHLORDIAZEPOXIDE HCL 25 MG PO CAPS: 25.0000 mg | ORAL_CAPSULE | Freq: Every day | ORAL | Status: DC

## 2023-08-05 MED ORDER — CHLORDIAZEPOXIDE HCL 25 MG PO CAPS
25.0000 mg | ORAL_CAPSULE | Freq: Three times a day (TID) | ORAL | Status: DC
  Administered 2023-08-06: 25 mg via ORAL
  Filled 2023-08-05: qty 1

## 2023-08-05 MED ORDER — CHLORDIAZEPOXIDE HCL 25 MG PO CAPS
25.0000 mg | ORAL_CAPSULE | Freq: Four times a day (QID) | ORAL | Status: AC
  Administered 2023-08-05 (×4): 25 mg via ORAL
  Filled 2023-08-05 (×4): qty 1

## 2023-08-05 MED ORDER — SODIUM CHLORIDE 0.9 % IV BOLUS
1000.0000 mL | Freq: Once | INTRAVENOUS | Status: AC
  Administered 2023-08-05: 1000 mL via INTRAVENOUS

## 2023-08-05 MED ORDER — LORAZEPAM 2 MG/ML IJ SOLN
1.0000 mg | INTRAMUSCULAR | Status: DC | PRN
  Administered 2023-08-05 (×2): 1 mg via INTRAVENOUS
  Administered 2023-08-05: 2 mg via INTRAVENOUS
  Administered 2023-08-05: 1 mg via INTRAVENOUS
  Administered 2023-08-06 (×2): 2 mg via INTRAVENOUS
  Filled 2023-08-05 (×8): qty 1

## 2023-08-05 NOTE — ED Notes (Signed)
pt continues to be tachy into the 140s with an episode of desating so I placed her on 2L [place] and she rebounded to 97%

## 2023-08-05 NOTE — Hospital Course (Signed)
Brief hospital course: PMH of alcohol abuse, HTN, depression presented to the hospital with complaints of alcohol withdrawal. Drinks approximately 30-35 drinks per day.  Has been drinking bourbon.  Last drink was 5 AM on the morning of 12/1. Does not want to drink but appears to be in severe withdrawal. Received phenobarbital load in the ED. Currently admitted for ongoing withdrawal symptoms.  Assessment and Plan: Alcohol withdrawal, question delirium tremens. Does not appear to be severely delirious at the time of my evaluation. CIWA score remains elevated. Continue CIWA protocol with IV Ativan. Librium protocol initiated. Has received phenobarbital in the ED will monitor if she requires redosing of the medication. Continue supportive care for now including thiamine.  Sinus tachycardia. EKG reassuring and unchanged from prior. D-dimer is negative. TSH is actually elevated 6.07 Free T4 is currently pending. Will check echocardiogram to ensure that she does not have alcohol induced cardiomyopathy.  Hyponatremia. Monitor after changing the fluids.  Vitamin B12 deficiency, relative. B12 level is 394. Will supplement.  Hypoglycemia. Likely from poor p.o. intake. Currently on D5. CBG every 4 hours.  Thrombocytopenia. In the past has been in 50s and 40s.  On admission his platelet count is 200 currently 121. Monitor.  Sickle cell on the smear. Unsure if this is true sickle cell or pseudo sickle cell in the setting of iron deficiency. Will recheck tomorrow and if persistent initiate further workup.  Elevated LFT. Improving with IV hydration. INR is 1.1 which is reassuring. Ultrasound liver shows hepatic steatosis.  Beta-hCG was checked given her age.  Negative.

## 2023-08-05 NOTE — Progress Notes (Signed)
[facility] Progress Note Patient: Cynthia Salazar ZOX:[id number] DOB: [birth year] DOA: 08/04/2023  DOS: the patient was seen and examined on 08/05/2023  Brief hospital course: PMH of alcohol abuse, HTN, depression presented to the hospital with complaints of alcohol withdrawal. Drinks approximately 30-35 drinks per day.  Has been drinking bourbon.  Last drink was 5 AM on the morning of 12/1. Does not want to drink but appears to be in severe withdrawal. Received phenobarbital load in the ED. Currently admitted for ongoing withdrawal symptoms.  Assessment and Plan: Alcohol withdrawal, question delirium tremens. Does not appear to be severely delirious at the time of my evaluation. CIWA score remains elevated. Continue CIWA protocol with IV Ativan. Librium protocol initiated. Has received phenobarbital in the ED will monitor if she requires redosing of the medication. Continue supportive care for now including thiamine.  Sinus tachycardia. EKG reassuring and unchanged from prior. D-dimer is negative. TSH is actually elevated 6.07 Free T4 is currently pending. Will check echocardiogram to ensure that she does not have alcohol induced cardiomyopathy.  Hyponatremia. Monitor after changing the fluids.  Vitamin B12 deficiency, relative. B12 level is 394. Will supplement.  Hypoglycemia. Likely from poor p.o. intake. Currently on D5. CBG every 4 hours.  Thrombocytopenia. In the past has been in 50s and 40s.  On admission his platelet count is 200 currently 121. Monitor.  Sickle cell on the smear. Unsure if this is true sickle cell or pseudo sickle cell in the setting of iron deficiency. Will recheck tomorrow and if persistent initiate further workup.  Elevated LFT. Improving with IV hydration. INR is 1.1 which is reassuring. Ultrasound liver shows hepatic steatosis.  Beta-hCG was checked given her age.  Negative.   Subjective: Drowsy.  No nausea no vomiting.  Denies any  acute complaint.  Physical Exam: General: in Mild distress, No Rash Cardiovascular: S1 and S2 Present, No Murmur Respiratory: Good respiratory effort, Bilateral Air entry present. No Crackles, No wheezes Abdomen: Bowel Sound present, No tenderness Extremities: No edema Neuro: Drowsy but easily awakened.  And oriented x3, no new focal deficit  Data Reviewed: I have Reviewed nursing notes, Vitals, and Lab results. Since last encounter, pertinent lab results CBC and BMP   . I have ordered test including CBC, BMP, beta-hCG, B12, folic acid, D-dimer  . I have ordered imaging echocardiogram   .   Disposition: Status is: Inpatient Remains inpatient appropriate because: Need therapy for alcohol withdrawal which appears to be severe.  enoxaparin (LOVENOX) injection 40 mg Start: 08/04/23 2200 SCDs Start: 08/04/23 2143   Family Communication: No one at bedside Level of care: Stepdown continue in stepdown. Vitals:   08/05/23 1314 08/05/23 1513 08/05/23 1600 08/05/23 1700  BP:  123/86 120/80 116/84  Pulse: (!) 113  (!) 110 (!) 112  Resp:   (!) 24 19  Temp:  98 F (36.7 C)    TempSrc:  Oral    SpO2:   92% 92%  Weight:      Height:        The patient is critically ill with multiple organ systems failure and requires high complexity decision making for assessment and support, frequent evaluation and titration of therapies. Critical Care Time devoted to patient care services described in this note is 35 minutes  Author: Lynden Oxford, MD 08/05/2023 6:28 PM  Please look on [email or web address] to find out who is on call.

## 2023-08-05 NOTE — Plan of Care (Signed)
  Problem: Pain Management: Goal: General experience of comfort will improve Outcome: Progressing   Problem: Safety: Goal: Ability to remain free from injury will improve Outcome: Progressing   Problem: Clinical Measurements: Goal: Ability to maintain clinical measurements within normal limits will improve Outcome: Not Progressing Goal: Diagnostic test results will improve Outcome: Not Progressing   Problem: Coping: Goal: Level of anxiety will decrease Outcome: Not Progressing

## 2023-08-05 NOTE — ED Notes (Signed)
Pt was given a breakfast tray. Pt was resting

## 2023-08-05 NOTE — ED Notes (Signed)
This RN notified J. Leoma Folds Nash, NP of pt's HR being in the 150's and CIWA score of 7. Geoffrey Mankin Nash, NP came to bedisde to evaluate pt and placed orders.

## 2023-08-05 NOTE — Progress Notes (Signed)
       Overnight   NAME: Cynthia Salazar MRN: [id number] DOB : 01/14/1991    Date of Service   08/05/2023   HPI/Events of Note    Notified for consideration of advancing CIWA score and concern for progression toward DTs.  Most recent CIWA was 7 at which time Medication was administered as previously ordered.  Due to large volume of documented ETOH intake, CIWA progressed to SDU/ICU protocol      Interventions/ Plan   CIWA as previously ordered and as needed for CIWA medication. SDU/ICU medication protocol  Continue all previous Admitting orders      Chinita Greenland BSN MSNA MSN ACNPC-AG Acute Care Nurse Practitioner [facility]

## 2023-08-05 NOTE — Progress Notes (Addendum)
  Echocardiogram 2D Echocardiogram has been attempted. Went to ED and pt was gone, went to pt's new room and was being evaluated after just arriving. Will attempt at another time.  Leda Roys RDCS 08/05/2023, 3:01 PM

## 2023-08-05 NOTE — ED Notes (Signed)
ED TO INPATIENT HANDOFF REPORT  Name/Age/Gender Cynthia Salazar 32 y.o. female  Code Status    Code Status Orders  (From admission, onward)           Start     Ordered   08/04/23 2144  Full code  Continuous       Question:  By:  Answer:  Consent: discussion documented in EHR   08/04/23 2144           Code Status History     Date Active Date Inactive Code Status Order ID Comments User Context   06/17/2022 1719 06/20/2022 1624 Full Code [id number]  Teddy Spike, DO Inpatient      Advance Directive Documentation    Flowsheet Row Most Recent Value  Type of Advance Directive Healthcare Power of Attorney, Living will  Pre-existing out of facility DNR order (yellow form or pink MOST form) --  "MOST" Form in Place? --       Home/SNF/Other Home  Chief Complaint Alcohol withdrawal (HCC) [F10.939]  Level of Care/Admitting Diagnosis ED Disposition     ED Disposition  Admit   Condition  --   Comment  Hospital Area: [facility] [[id number]]  Level of Care: Stepdown [14]  Admit to SDU based on following criteria: Hemodynamic compromise or significant risk of instability:  Patient requiring short term acute titration and management of vasoactive drips, and invasive monitoring (i.e., CVP and Arterial line).  [facility] to [facility] if equivalent level of care is available:: Yes  Covid Evaluation: Asymptomatic - no recent exposure (last 10 days) testing not required  Diagnosis: Alcohol withdrawal (HCC) [291.81.ICD-9-CM]  Admitting Physician: Alan Mulder [[id number]]  Attending Physician: Alan Mulder [[id number]]  Certification:: I certify this patient will need inpatient services for at least 2 midnights  Expected Medical Readiness: 08/07/2023          Medical History Past Medical History:  Diagnosis Date   Abnormal liver function test 03/18/2013   Alcohol use 06/03/2015   Allergy    Asthma    Chicken pox as a  child   Depression with anxiety    Essential hypertension 06/03/2015   Insomnia 03/17/2013   Other and unspecified hyperlipidemia 03/18/2013    Allergies Allergies  Allergen Reactions   Aleve [Naproxen Sodium]     wheeze    IV Location/Drains/Wounds Patient Lines/Drains/Airways Status     Active Line/Drains/Airways     Name Placement date Placement time Site Days   Peripheral IV 08/05/23 20 G 1.88" Anterior;Right Forearm 08/05/23  1149  Forearm  less than 1            Labs/Imaging Results for orders placed or performed during the hospital encounter of 08/04/23 (from the past 48 hour(s))  CBC with Differential     Status: Abnormal   Collection Time: 08/04/23  5:25 PM  Result Value Ref Range   WBC 10.6 (H) 4.0 - 10.5 K/uL   RBC 5.10 3.87 - 5.11 MIL/uL   Hemoglobin 15.0 12.0 - 15.0 g/dL   HCT 62.1 30.8 - 65.7 %   MCV 84.9 80.0 - 100.0 fL   MCH 29.4 26.0 - 34.0 pg   MCHC 34.6 30.0 - 36.0 g/dL   RDW 84.6 96.2 - 95.2 %   Platelets 200 150 - 400 K/uL   nRBC 0.0 0.0 - 0.2 %   Neutrophils Relative % 87 %   Neutro Abs 9.4 (H) 1.7 - 7.7 K/uL   Lymphocytes  Relative 6 %   Lymphs Abs 0.6 (L) 0.7 - 4.0 K/uL   Monocytes Relative 5 %   Monocytes Absolute 0.6 0.1 - 1.0 K/uL   Eosinophils Relative 0 %   Eosinophils Absolute 0.0 0.0 - 0.5 K/uL   Basophils Relative 1 %   Basophils Absolute 0.1 0.0 - 0.1 K/uL   Immature Granulocytes 1 %   Abs Immature Granulocytes 0.05 0.00 - 0.07 K/uL    Comment: Performed at [facility], [address], [place], [place] [address]  Comprehensive metabolic panel     Status: Abnormal   Collection Time: 08/04/23  5:25 PM  Result Value Ref Range   Sodium 133 (L) 135 - 145 mmol/L   Potassium 3.6 3.5 - 5.1 mmol/L   Chloride 95 (L) 98 - 111 mmol/L   CO2 16 (L) 22 - 32 mmol/L   Glucose, Bld 105 (H) 70 - 99 mg/dL    Comment: Glucose reference range applies only to samples taken after fasting for at least 8 hours.   BUN 12 6 - 20  mg/dL   Creatinine, Ser 6.04 0.44 - 1.00 mg/dL   Calcium 8.1 (L) 8.9 - 10.3 mg/dL   Total Protein 7.6 6.5 - 8.1 g/dL   Albumin 4.3 3.5 - 5.0 g/dL   AST 540 (H) 15 - 41 U/L   ALT 146 (H) 0 - 44 U/L   Alkaline Phosphatase 69 38 - 126 U/L   Total Bilirubin 1.3 (H) <1.2 mg/dL   GFR, Estimated >98 >11 mL/min    Comment: (NOTE) Calculated using the CKD-EPI Creatinine Equation (2021)    Anion gap 22 (H) 5 - 15    Comment: ELECTROLYTES REPEATED TO VERIFY Performed at [facility], [address], [place], [place] [address]   Magnesium     Status: None   Collection Time: 08/04/23  5:25 PM  Result Value Ref Range   Magnesium 2.1 1.7 - 2.4 mg/dL    Comment: Performed at [facility], [address], [place], [place] [address]  Hepatitis panel, acute     Status: None   Collection Time: 08/05/23 12:12 AM  Result Value Ref Range   Hepatitis B Surface Ag NON REACTIVE NON REACTIVE   HCV Ab NON REACTIVE NON REACTIVE    Comment: (NOTE) Nonreactive HCV antibody screen is consistent with no HCV infections,  unless recent infection is suspected or other evidence exists to indicate HCV infection.     Hep A IgM NON REACTIVE NON REACTIVE   Hep B C IgM NON REACTIVE NON REACTIVE    Comment: Performed at [facility], [address], [place], [place] [address]  CBC     Status: Abnormal   Collection Time: 08/05/23  5:53 AM  Result Value Ref Range   WBC 5.7 4.0 - 10.5 K/uL   RBC 4.47 3.87 - 5.11 MIL/uL   Hemoglobin 13.3 12.0 - 15.0 g/dL   HCT 57.8 46.9 - 62.9 %   MCV 86.8 80.0 - 100.0 fL   MCH 29.8 26.0 - 34.0 pg   MCHC 34.3 30.0 - 36.0 g/dL   RDW 52.8 41.3 - 24.4 %   Platelets 121 (L) 150 - 400 K/uL   nRBC 0.0 0.0 - 0.2 %    Comment: Performed at [facility], [address] [place], [place] [address]  Basic metabolic panel     Status: Abnormal   Collection Time: 08/05/23  5:53 AM  Result  Value Ref Range   Sodium 128 (L) 135 -  145 mmol/L   Potassium 3.7 3.5 - 5.1 mmol/L   Chloride 93 (L) 98 - 111 mmol/L   CO2 20 (L) 22 - 32 mmol/L   Glucose, Bld 58 (L) 70 - 99 mg/dL    Comment: Glucose reference range applies only to samples taken after fasting for at least 8 hours.   BUN 11 6 - 20 mg/dL   Creatinine, Ser 1.61 0.44 - 1.00 mg/dL   Calcium 8.3 (L) 8.9 - 10.3 mg/dL   GFR, Estimated >09 >60 mL/min    Comment: (NOTE) Calculated using the CKD-EPI Creatinine Equation (2021)    Anion gap 15 5 - 15    Comment: Performed at [facility], [address], [place], [place] [address]  Hepatic function panel     Status: Abnormal   Collection Time: 08/05/23  5:53 AM  Result Value Ref Range   Total Protein 6.8 6.5 - 8.1 g/dL   Albumin 4.0 3.5 - 5.0 g/dL   AST 811 (H) 15 - 41 U/L   ALT 119 (H) 0 - 44 U/L   Alkaline Phosphatase 60 38 - 126 U/L   Total Bilirubin 1.2 (H) <1.2 mg/dL   Bilirubin, Direct 0.4 (H) 0.0 - 0.2 mg/dL   Indirect Bilirubin 0.8 0.3 - 0.9 mg/dL    Comment: Performed at [facility], [address] [place], [place] [address]  Differential     Status: None   Collection Time: 08/05/23  5:53 AM  Result Value Ref Range   Neutrophils Relative % 78 %   Neutro Abs 3.7 1.7 - 7.7 K/uL   Lymphocytes Relative 17 %   Lymphs Abs 0.8 0.7 - 4.0 K/uL   Monocytes Relative 4 %   Monocytes Absolute 0.2 0.1 - 1.0 K/uL   Eosinophils Relative 0 %   Eosinophils Absolute 0.0 0.0 - 0.5 K/uL   Basophils Relative 1 %   Basophils Absolute 0.1 0.0 - 0.1 K/uL   Immature Granulocytes 0 %   Abs Immature Granulocytes 0.01 0.00 - 0.07 K/uL   Polychromasia PRESENT    Sickle Cells PRESENT    Target Cells PRESENT     Comment: Performed at [facility], [address] [place], [place] [address]  hCG, quantitative, pregnancy     Status: None   Collection Time: 08/05/23 11:30 AM  Result Value Ref Range   hCG, Beta Chain, Quant, S <1 <5 mIU/mL    Comment:          GEST.  AGE      CONC.  (mIU/mL)   <=1 WEEK        5 - 50     2 WEEKS       50 - 500     3 WEEKS       100 - 10,000     4 WEEKS     1,000 - 30,000     5 WEEKS     3,500 - 115,000   6-8 WEEKS     12,000 - 270,000    12 WEEKS     15,000 - 220,000        FEMALE AND NON-PREGNANT FEMALE:     LESS THAN 5 mIU/mL Performed at [facility], [address], [place], [place] [address]   TSH     Status: Abnormal   Collection Time: 08/05/23 11:31 AM  Result Value Ref Range  TSH 6.007 (H) 0.350 - 4.500 uIU/mL    Comment: Performed by a 3rd Generation assay with a functional sensitivity of <=0.01 uIU/mL. Performed at [facility], [address], [place], [place] [address]   Vitamin B12     Status: None   Collection Time: 08/05/23 11:31 AM  Result Value Ref Range   Vitamin B-12 394 180 - 914 pg/mL    Comment: HEMOLYSIS AT THIS LEVEL MAY AFFECT RESULT MODERATE HEMOLYSIS (NOTE) This assay is not validated for testing neonatal or myeloproliferative syndrome specimens for Vitamin B12 levels. Performed at [facility], [address] [place], [place] [address]    US Abdomen Limited RUQ (LIVER/GB)  Result Date: 08/04/2023 CLINICAL DATA:  Elevated liver function tests. EXAM: ULTRASOUND ABDOMEN LIMITED RIGHT UPPER QUADRANT COMPARISON:  June 17, 2022 FINDINGS: Gallbladder: No gallstones or wall thickening visualized (1.2 mm). No sonographic Murphy sign noted by sonographer. Common bile duct: Diameter: 2.7 mm Liver: No focal lesion identified. Diffusely increased echogenicity of the liver parenchyma is noted. Portal vein is patent on color Doppler imaging with normal direction of blood flow towards the liver. Other: None. IMPRESSION: Hepatic steatosis without evidence of focal liver lesions. Electronically Signed   By: Aram Candela M.D.   On: 08/04/2023 22:40    Pending Labs Unresulted Labs (From admission, onward)     Start     Ordered    08/05/23 1141  Technologist smear review  Once,   AD        08/05/23 1141   08/05/23 1103  Protime-INR  Once,   R        08/05/23 1102   08/05/23 1102  Folate  Once,   R        08/05/23 1101   08/05/23 1101  D-dimer, quantitative  Once,   R        08/05/23 1100   08/05/23 1101  T4, free  Once,   R        08/05/23 1100            Vitals/Pain Today's Vitals   08/05/23 1203 08/05/23 1228 08/05/23 1245 08/05/23 1314  BP:   124/87   Pulse: (!) 124  (!) 110 (!) 113  Resp:   20   Temp:  98.3 F (36.8 C)    TempSrc:  Oral    SpO2:   94%   Weight:      Height:      PainSc:        Isolation Precautions No active isolations  Medications Medications  enoxaparin (LOVENOX) injection 40 mg (40 mg Subcutaneous Given 08/04/23 2227)  acetaminophen (TYLENOL) tablet 650 mg (has no administration in time range)    Or  acetaminophen (TYLENOL) suppository 650 mg (has no administration in time range)  oxyCODONE (Oxy IR/ROXICODONE) immediate release tablet 5 mg (has no administration in time range)  ondansetron (ZOFRAN) tablet 4 mg ( Oral See Alternative 08/05/23 0253)    Or  ondansetron (ZOFRAN) injection 4 mg (4 mg Intravenous Given 08/05/23 0253)  thiamine (VITAMIN B1) tablet 100 mg (100 mg Oral Given 08/05/23 0959)    Or  thiamine (VITAMIN B1) injection 100 mg ( Intravenous See Alternative 08/05/23 0959)  folic acid (FOLVITE) tablet 1 mg (1 mg Oral Given 08/05/23 0959)  multivitamin with minerals tablet 1 tablet (1 tablet Oral Given 08/05/23 0959)  LORazepam (ATIVAN) tablet 1-4 mg ( Oral See Alternative 08/05/23 1210)    Or  LORazepam (ATIVAN) injection  1-4 mg (2 mg Intravenous Given 08/05/23 1210)  prochlorperazine (COMPAZINE) injection 5 mg (has no administration in time range)  hydrOXYzine (ATARAX) tablet 25 mg (has no administration in time range)  loperamide (IMODIUM) capsule 2-4 mg (has no administration in time range)  chlordiazePOXIDE (LIBRIUM) capsule 25 mg (25 mg Oral Given  08/05/23 0959)    Followed by  chlordiazePOXIDE (LIBRIUM) capsule 25 mg (has no administration in time range)    Followed by  chlordiazePOXIDE (LIBRIUM) capsule 25 mg (has no administration in time range)    Followed by  chlordiazePOXIDE (LIBRIUM) capsule 25 mg (has no administration in time range)  dextrose 5 %-0.9 % sodium chloride infusion ( Intravenous New Bag/Given 08/05/23 1000)  PHENObarbital (LUMINAL) 260 mg in sodium chloride 0.9 % 100 mL IVPB (0 mg Intravenous Stopped 08/04/23 1907)  lactated ringers bolus 1,000 mL (0 mLs Intravenous Stopped 08/05/23 0151)  thiamine (VITAMIN B1) injection 100 mg (100 mg Intravenous Given 08/04/23 1737)  PHENObarbital (LUMINAL) injection 130 mg (130 mg Intravenous Given 08/05/23 0314)  sodium chloride 0.9 % bolus 1,000 mL (0 mLs Intravenous Stopped 08/05/23 1313)    Mobility walks

## 2023-08-06 ENCOUNTER — Inpatient Hospital Stay (HOSPITAL_COMMUNITY): Payer: Medicaid Other

## 2023-08-06 DIAGNOSIS — R0609 Other forms of dyspnea: Secondary | ICD-10-CM

## 2023-08-06 DIAGNOSIS — F10939 Alcohol use, unspecified with withdrawal, unspecified: Secondary | ICD-10-CM | POA: Diagnosis not present

## 2023-08-06 LAB — GLUCOSE, CAPILLARY
Glucose-Capillary: 103 mg/dL — ABNORMAL HIGH (ref 70–99)
Glucose-Capillary: 98 mg/dL (ref 70–99)
Glucose-Capillary: 115 mg/dL — ABNORMAL HIGH (ref 70–99)
Glucose-Capillary: 100 mg/dL — ABNORMAL HIGH (ref 70–99)
Glucose-Capillary: 113 mg/dL — ABNORMAL HIGH (ref 70–99)

## 2023-08-06 LAB — COMPREHENSIVE METABOLIC PANEL
ALT: 90 U/L — ABNORMAL HIGH (ref 0–44)
AST: 129 U/L — ABNORMAL HIGH (ref 15–41)
Albumin: 3.3 g/dL — ABNORMAL LOW (ref 3.5–5.0)
Alkaline Phosphatase: 50 U/L (ref 38–126)
Anion gap: 14 (ref 5–15)
BUN: <5 mg/dL — ABNORMAL LOW (ref 6–20)
CO2: 23 mmol/L (ref 22–32)
Calcium: 7.9 mg/dL — ABNORMAL LOW (ref 8.9–10.3)
Chloride: 99 mmol/L (ref 98–111)
Creatinine, Ser: 0.42 mg/dL — ABNORMAL LOW (ref 0.44–1.00)
GFR, Estimated: >60 mL/min (ref >60)
Glucose, Bld: 99 mg/dL (ref 70–99)
Potassium: 2.8 mmol/L — ABNORMAL LOW (ref 3.5–5.1)
Sodium: 136 mmol/L (ref 135–145)
Total Bilirubin: 1.2 mg/dL — ABNORMAL HIGH (ref <1.2)
Total Protein: 5.9 g/dL — ABNORMAL LOW (ref 6.5–8.1)

## 2023-08-06 LAB — CBC WITH DIFFERENTIAL/PLATELET
Abs Immature Granulocytes: 0.01 10*3/uL (ref 0.00–0.07)
Basophils Absolute: 0 10*3/uL (ref 0.0–0.1)
Basophils Relative: 1 %
Eosinophils Absolute: 0.1 10*3/uL (ref 0.0–0.5)
Eosinophils Relative: 3 %
HCT: 38.6 % (ref 36.0–46.0)
Hemoglobin: 13 g/dL (ref 12.0–15.0)
Immature Granulocytes: 0 %
Lymphocytes Relative: 23 %
Lymphs Abs: 0.9 10*3/uL (ref 0.7–4.0)
MCH: 29.7 pg (ref 26.0–34.0)
MCHC: 33.7 g/dL (ref 30.0–36.0)
MCV: 88.1 fL (ref 80.0–100.0)
Monocytes Absolute: 0.2 10*3/uL (ref 0.1–1.0)
Monocytes Relative: 5 %
Neutro Abs: 2.6 10*3/uL (ref 1.7–7.7)
Neutrophils Relative %: 68 %
Platelets: 99 10*3/uL — ABNORMAL LOW (ref 150–400)
RBC: 4.38 MIL/uL (ref 3.87–5.11)
RDW: 13.4 % (ref 11.5–15.5)
WBC: 3.8 10*3/uL — ABNORMAL LOW (ref 4.0–10.5)
nRBC: 0 % (ref 0.0–0.2)

## 2023-08-06 LAB — BASIC METABOLIC PANEL
Anion gap: 8 (ref 5–15)
BUN: <5 mg/dL — ABNORMAL LOW (ref 6–20)
CO2: 27 mmol/L (ref 22–32)
Calcium: 8 mg/dL — ABNORMAL LOW (ref 8.9–10.3)
Chloride: 99 mmol/L (ref 98–111)
Creatinine, Ser: 0.57 mg/dL (ref 0.44–1.00)
GFR, Estimated: >60 mL/min (ref >60)
Glucose, Bld: 102 mg/dL — ABNORMAL HIGH (ref 70–99)
Potassium: 3.2 mmol/L — ABNORMAL LOW (ref 3.5–5.1)
Sodium: 134 mmol/L — ABNORMAL LOW (ref 135–145)

## 2023-08-06 LAB — TECHNOLOGIST SMEAR REVIEW: Plt Morphology: NORMAL

## 2023-08-06 LAB — ECHOCARDIOGRAM COMPLETE
AR max vel: 3.88 cm2
AV Area VTI: 4.91 cm2
AV Area mean vel: 4.17 cm2
AV Mean grad: 4 mm[Hg]
AV Peak grad: 8.1 mm[Hg]
Ao pk vel: 1.42 m/s
Area-P 1/2: 4.15 cm2
Calc EF: 60.4 %
Height: 62 in
MV VTI: 6.71 cm2
S' Lateral: 2 cm
Single Plane A2C EF: 61.2 %
Single Plane A4C EF: 59.4 %
Weight: 2560 [oz_av]

## 2023-08-06 LAB — T4, FREE: Free T4: 0.89 ng/dL (ref 0.61–1.12)

## 2023-08-06 LAB — MAGNESIUM: Magnesium: 1.8 mg/dL (ref 1.7–2.4)

## 2023-08-06 LAB — PHOSPHORUS: Phosphorus: 2.2 mg/dL — ABNORMAL LOW (ref 2.5–4.6)

## 2023-08-06 MED ORDER — POTASSIUM CHLORIDE 20 MEQ PO PACK
40.0000 meq | PACK | Freq: Once | ORAL | Status: AC
  Administered 2023-08-06: 40 meq via ORAL
  Filled 2023-08-06: qty 2

## 2023-08-06 MED ORDER — MAGNESIUM SULFATE 2 GM/50ML IV SOLN
2.0000 g | Freq: Once | INTRAVENOUS | Status: AC
  Administered 2023-08-06: 2 g via INTRAVENOUS
  Filled 2023-08-06: qty 50

## 2023-08-06 MED ORDER — METOPROLOL TARTRATE 25 MG PO TABS
25.0000 mg | ORAL_TABLET | Freq: Two times a day (BID) | ORAL | Status: DC
  Administered 2023-08-06 – 2023-08-07 (×3): 25 mg via ORAL
  Filled 2023-08-06 (×3): qty 1

## 2023-08-06 MED ORDER — POTASSIUM CHLORIDE 20 MEQ PO PACK
60.0000 meq | PACK | Freq: Once | ORAL | Status: AC
  Administered 2023-08-06: 60 meq via ORAL
  Filled 2023-08-06: qty 3

## 2023-08-06 MED ORDER — CHLORDIAZEPOXIDE HCL 25 MG PO CAPS: 25.0000 mg | ORAL_CAPSULE | Freq: Every day | ORAL | Status: DC

## 2023-08-06 MED ORDER — CALCIUM CARBONATE 1250 (500 CA) MG PO TABS
1.0000 | ORAL_TABLET | Freq: Two times a day (BID) | ORAL | Status: AC
  Administered 2023-08-06 (×2): 1250 mg via ORAL
  Filled 2023-08-06 (×2): qty 1

## 2023-08-06 MED ORDER — CHLORDIAZEPOXIDE HCL 25 MG PO CAPS
25.0000 mg | ORAL_CAPSULE | Freq: Two times a day (BID) | ORAL | Status: DC
  Administered 2023-08-06 – 2023-08-07 (×2): 25 mg via ORAL
  Filled 2023-08-06 (×2): qty 1

## 2023-08-06 MED ORDER — ENSURE ENLIVE PO LIQD
237.0000 mL | Freq: Three times a day (TID) | ORAL | Status: DC
  Administered 2023-08-06 (×3): 237 mL via ORAL

## 2023-08-06 MED ORDER — POTASSIUM CHLORIDE CRYS ER 20 MEQ PO TBCR
40.0000 meq | EXTENDED_RELEASE_TABLET | ORAL | Status: DC
  Administered 2023-08-06: 40 meq via ORAL
  Filled 2023-08-06: qty 2

## 2023-08-06 MED ORDER — DEXTROSE-SODIUM CHLORIDE 5-0.9 % IV SOLN: INTRAVENOUS | Status: AC

## 2023-08-06 NOTE — Progress Notes (Signed)
[facility] Progress Note Patient: Cynthia Salazar KGU:[id number] DOB: July 31, 1991 DOA: 08/04/2023  DOS: the patient was seen and examined on 08/06/2023  Brief hospital course: PMH of alcohol abuse, HTN, depression presented to the hospital with complaints of alcohol withdrawal. Drinks approximately 30-35 drinks per day.  Has been drinking bourbon.  Last drink was 5 AM on the morning of 12/1. Does not want to drink but appears to be in severe withdrawal. Received phenobarbital load in the ED. Currently admitted for ongoing withdrawal symptoms.  Assessment and Plan: Alcohol withdrawal, question delirium tremens. Does not appear to be severely delirious at the time of my evaluation. CIWA score remains elevated. Continue CIWA protocol with IV Ativan. Librium protocol initiated. Has received phenobarbital in the ED will monitor if she requires redosing of the medication. Continue supportive care for now including thiamine.  Sinus tachycardia. EKG reassuring and unchanged from prior. D-dimer is negative. TSH is actually elevated 6.07 Free T4 is currently pending. Echocardiogram reassuring but hyperdynamic circulation with EF 70%.  Metoprolol added.  Hyponatremia. Mild.  Monitor.  Vitamin B12 deficiency, relative. B12 level is 394. Will supplement.  Hypoglycemia. Likely from poor p.o. intake. Currently on D5. CBG every 4 hours.  Thrombocytopenia. In the past has been in 50s and 40s.  On admission his platelet count is 200 currently 121.  Noted and down again.  No bleeding.  Monitor.  Sickle cell on the smear. Unsure if this is true sickle cell or pseudo sickle cell in the setting of iron deficiency. Despite the orders repeat smear was not performed.  Will request repeat smear again tomorrow on 12/4.  Elevated LFT. Improving with IV hydration. INR is 1.1 which is reassuring. Ultrasound liver shows hepatic steatosis.  Hypokalemia. Replace.  Mild hepatitis, likely  alcoholic. LFTs are improving.  Monitor.  Beta-hCG was checked given her age.  Negative.   Subjective: Drowsy, chaperone was present.  No nausea no vomiting.  No fever no chills.  No chest pain.  Physical Exam: In no distress.  No rash. S1-S2 present.  No murmur. To auscultation. Bowel sound present.  Nontender. No edema. Drowsy but easily awakened.  No focal deficit.  Data Reviewed: I have Reviewed nursing notes, Vitals, and Lab results. Reviewed CBC and CMP.  Reordered CBC and CMP.  Disposition: Status is: Inpatient Remains inpatient appropriate because: Continue alcohol withdrawal therapy.  enoxaparin (LOVENOX) injection 40 mg Start: 08/04/23 2200 SCDs Start: 08/04/23 2143   Family Communication: No one at bedside Level of care: Progressive transfer to progressive care.. Vitals:   08/06/23 1700 08/06/23 1721 08/06/23 1800 08/06/23 1900  BP:  114/71 107/73 127/78  Pulse: 83 (!) 48 100 99  Resp: 17 19 18  (!) 21  Temp:      TempSrc:      SpO2: 94% 97% 92% 98%  Weight:      Height:         Author: Lynden Oxford, MD 08/06/2023 8:19 PM  Please look on [email or web address] to find out who is on call.

## 2023-08-06 NOTE — Progress Notes (Signed)
   08/06/23 1444  TOC Brief Assessment  Insurance and Status Reviewed  Patient has primary care physician Yes Abner Greenspan, Bryon Lions, MD)  Home environment has been reviewed Yes home alone per patient  Prior level of function: Independent  Prior/Current Home Services No current home services  Social Determinants of Health Reivew SDOH reviewed no interventions necessary (patient is still unablle to answer questions will continue to attempt to review)  Readmission risk has been reviewed Yes  Transition of care needs transition of care needs identified, TOC will continue to follow   Substance abuse screen with CAGE aid and resources have been added to AVS

## 2023-08-06 NOTE — Plan of Care (Signed)
  Problem: Education: ?Goal: Knowledge of General Education information will improve ?Description: Including pain rating scale, medication(s)/side effects and non-pharmacologic comfort measures ?Outcome: Progressing ?  ?Problem: Clinical Measurements: ?Goal: Will remain free from infection ?Outcome: Progressing ?  ?Problem: Activity: ?Goal: Risk for activity intolerance will decrease ?Outcome: Progressing ?  ?Problem: Coping: ?Goal: Level of anxiety will decrease ?Outcome: Progressing ?  ?Problem: Safety: ?Goal: Ability to remain free from injury will improve ?Outcome: Progressing ?  ?

## 2023-08-06 NOTE — Plan of Care (Signed)
Plan of care and goals reviewed with patient and family, time given for questions,. Both patient and family aware of transfer to 1430. Patient handbook/guide at bedside, report called to Alberteen Spindle.  Problem: Education: Goal: Knowledge of General Education information will improve Description: Including pain rating scale, medication(s)/side effects and non-pharmacologic comfort measures Outcome: Progressing   Problem: Health Behavior/Discharge Planning: Goal: Ability to manage health-related needs will improve Outcome: Progressing   Problem: Clinical Measurements: Goal: Ability to maintain clinical measurements within normal limits will improve Outcome: Progressing Goal: Will remain free from infection Outcome: Progressing Goal: Diagnostic test results will improve Outcome: Progressing Goal: Respiratory complications will improve Outcome: Progressing Goal: Cardiovascular complication will be avoided Outcome: Progressing   Problem: Activity: Goal: Risk for activity intolerance will decrease Outcome: Progressing   Problem: Nutrition: Goal: Adequate nutrition will be maintained Outcome: Progressing   Problem: Coping: Goal: Level of anxiety will decrease Outcome: Progressing   Problem: Elimination: Goal: Will not experience complications related to bowel motility Outcome: Progressing Goal: Will not experience complications related to urinary retention Outcome: Progressing   Problem: Pain Management: Goal: General experience of comfort will improve Outcome: Progressing   Problem: Safety: Goal: Ability to remain free from injury will improve Outcome: Progressing   Problem: Skin Integrity: Goal: Risk for impaired skin integrity will decrease Outcome: Progressing

## 2023-08-06 NOTE — TOC Initial Note (Signed)
Transition of Care [facility]) - Initial/Assessment Note    Patient Details  Name: Cynthia Salazar MRN: [id number] Date of Birth: 03/06/1991  Transition of Care [facility]) CM/SW Contact:    Beckie Busing, RN Phone Number:[phone]  08/06/2023, 3:15 PM  Clinical Narrative:                 [facility] acknowledges consult for ETOH abuse resources. CM at bedside for assessment. Patient opens her eyes and nods to questions. CAGE aid complete. Patient does conform that she comes from home alone where she functions independently. Substance abuse resources have been added to the chart under discharge instructions. TOC will continue to follow.     Barriers to Discharge: Continued Medical Work up   Patient Goals and CMS Choice            Expected Discharge Plan and Services                                              Prior Living Arrangements/Services                       Activities of Daily Living   ADL Screening (condition at time of admission) Independently performs ADLs?: Yes (appropriate for developmental age) Rich Reining) Is the patient deaf or have difficulty hearing?: No (UTA) Does the patient have difficulty seeing, even when wearing glasses/contacts?: No (UTA) Does the patient have difficulty concentrating, remembering, or making decisions?: No (UTA)  Permission Sought/Granted                  Emotional Assessment              Admission diagnosis:  Alcohol withdrawal (HCC) [F10.939] Alcohol withdrawal syndrome with complication (HCC) [F10.939] Patient Active Problem List   Diagnosis Date Noted   History of alcohol abuse 10/31/2022   Alcohol withdrawal (HCC) 06/17/2022   Elevated d-dimer 06/17/2022   Marijuana abuse 06/17/2022   Viral gastroenteritis 03/19/2022   Labile blood pressure 08/06/2018   Reflux involving intestinal tract 10/31/2017   Heavy menstrual bleeding 10/31/2017   Obesity (BMI 30.0-34.9) 04/05/2014   Low back pain 11/18/2013    Hyperlipidemia, mixed 03/18/2013   Elevated LFTs 03/18/2013   Insomnia 03/17/2013   Mild intermittent asthma 05/29/2012   Preventative health care 05/28/2012   Depression with anxiety    Allergy    PCP:  Bradd Canary, MD Pharmacy:   [id number] [place], [place] - [address] [place] [address] Phone: [phone] Fax: [phone]  [facility] [address] [place] [address] Phone: [phone] Fax: [phone]  [facility] [address], [place] - [address] [place] [address] Phone: [phone] Fax: [phone]  [id number] - [place], [place] - [address] AT [facility] [address] [place] [address] Phone: [phone] Fax: [phone]  [facility] [address] [place], [place] - [address] [facility] [address] [place]. [place] [address] Phone: [phone] Fax: [phone]  [id number] [place], [place] - [address] [place] [address] Phone: [phone]  Fax: [phone]  [id number] - [place], [place] - [address] [facility] [address]  [place] [address] Phone: [phone] Fax: [phone]     Social Determinants of Health (SDOH) Social History: SDOH Screenings   Food Insecurity: Patient Unable To Answer (08/05/2023)  Housing: Patient Unable To Answer (08/05/2023)  Transportation Needs: Patient Unable To Answer (08/05/2023)  Utilities: Patient Unable To Answer (08/05/2023)  Depression (PHQ2-9): Medium Risk (10/31/2022)  Social Connections: Unknown (01/16/2022)   Received from [facility]  Tobacco Use: Medium Risk (08/04/2023)   SDOH Interventions:      Readmission Risk Interventions     No data to display

## 2023-08-06 NOTE — Progress Notes (Signed)
  Echocardiogram 2D Echocardiogram has been performed.  Ocie Doyne RDCS 08/06/2023, 8:13 AM

## 2023-08-06 NOTE — Plan of Care (Signed)
  Problem: Clinical Measurements: Goal: Ability to maintain clinical measurements within normal limits will improve Outcome: Progressing   Problem: Activity: Goal: Risk for activity intolerance will decrease Outcome: Progressing   Problem: Coping: Goal: Level of anxiety will decrease Outcome: Progressing   Problem: Pain Management: Goal: General experience of comfort will improve Outcome: Progressing   Problem: Safety: Goal: Ability to remain free from injury will improve Outcome: Progressing   Problem: Nutrition: Goal: Adequate nutrition will be maintained Outcome: Not Progressing

## 2023-08-06 NOTE — Progress Notes (Signed)
   08/06/23 1459  OTHER  Substance Abuse Education Offered Yes  Substance abuse interventions Educational Materials  (CAGE-AID) Substance Abuse Screening Tool  Have You Ever Felt You Ought to Cut Down on Your Drinking or Drug Use? 1  Have People Annoyed You By Critizing Your Drinking Or Drug Use? 1  Have You Felt Bad Or Guilty About Your Drinking Or Drug Use? 1  Have You Ever Had a Drink or Used Drugs First Thing In The Morning to Steady Your Nerves or to Get Rid of a Hangover? 1  CAGE-AID Score 4   CAGE aid completed.  Patient is lethargic and nods to questions.

## 2023-08-07 DIAGNOSIS — F10931 Alcohol use, unspecified with withdrawal delirium: Secondary | ICD-10-CM | POA: Diagnosis not present

## 2023-08-07 LAB — COMPREHENSIVE METABOLIC PANEL
ALT: 95 U/L — ABNORMAL HIGH (ref 0–44)
AST: 127 U/L — ABNORMAL HIGH (ref 15–41)
Albumin: 3.5 g/dL (ref 3.5–5.0)
Alkaline Phosphatase: 53 U/L (ref 38–126)
Anion gap: 10 (ref 5–15)
BUN: <5 mg/dL — ABNORMAL LOW (ref 6–20)
CO2: 25 mmol/L (ref 22–32)
Calcium: 8.5 mg/dL — ABNORMAL LOW (ref 8.9–10.3)
Chloride: 100 mmol/L (ref 98–111)
Creatinine, Ser: 0.65 mg/dL (ref 0.44–1.00)
GFR, Estimated: >60 mL/min (ref >60)
Glucose, Bld: 89 mg/dL (ref 70–99)
Potassium: 3.8 mmol/L (ref 3.5–5.1)
Sodium: 135 mmol/L (ref 135–145)
Total Bilirubin: 0.9 mg/dL (ref <1.2)
Total Protein: 6.4 g/dL — ABNORMAL LOW (ref 6.5–8.1)

## 2023-08-07 LAB — CBC WITH DIFFERENTIAL/PLATELET
Abs Immature Granulocytes: 0.02 10*3/uL (ref 0.00–0.07)
Basophils Absolute: 0 10*3/uL (ref 0.0–0.1)
Basophils Relative: 1 %
Eosinophils Absolute: 0.2 10*3/uL (ref 0.0–0.5)
Eosinophils Relative: 5 %
HCT: 41.5 % (ref 36.0–46.0)
Hemoglobin: 13.5 g/dL (ref 12.0–15.0)
Immature Granulocytes: 1 %
Lymphocytes Relative: 24 %
Lymphs Abs: 0.8 10*3/uL (ref 0.7–4.0)
MCH: 29.5 pg (ref 26.0–34.0)
MCHC: 32.5 g/dL (ref 30.0–36.0)
MCV: 90.6 fL (ref 80.0–100.0)
Monocytes Absolute: 0.2 10*3/uL (ref 0.1–1.0)
Monocytes Relative: 7 %
Neutro Abs: 2.1 10*3/uL (ref 1.7–7.7)
Neutrophils Relative %: 62 %
Platelets: 75 10*3/uL — ABNORMAL LOW (ref 150–400)
RBC: 4.58 MIL/uL (ref 3.87–5.11)
RDW: 13.5 % (ref 11.5–15.5)
WBC: 3.3 10*3/uL — ABNORMAL LOW (ref 4.0–10.5)
nRBC: 0 % (ref 0.0–0.2)

## 2023-08-07 LAB — MAGNESIUM: Magnesium: 2.2 mg/dL (ref 1.7–2.4)

## 2023-08-07 MED ORDER — METOPROLOL TARTRATE 25 MG PO TABS: 25.0000 mg | ORAL_TABLET | Freq: Two times a day (BID) | ORAL | 1 refills | Status: DC

## 2023-08-07 NOTE — Progress Notes (Signed)
Pt to lobby via w/c to mom's car. Mother will pick pt's prescription for metoprolol enroute to the facility. Pt could not remember if she had shoes on when she came in- white nikes w/ a green stripe. This RN checked pt's room, ICU & ED - no  shoes located- pt and mom updated. Pt will have mom check at home

## 2023-08-07 NOTE — Progress Notes (Signed)
Pt will be d/c to facility and mother will be taking here there at 1 pm.

## 2023-08-07 NOTE — Discharge Summary (Signed)
Physician Discharge Summary  Cynthia Salazar NGE:[id number] DOB: [birth year] DOA: 08/04/2023  PCP: Bradd Canary, MD  Admit date: 08/04/2023 Discharge date: 08/07/2023  Time spent: 40 minutes  Recommendations for Outpatient Follow-up:  Needs Chem-12, INR, CBC in 1 week Patient making arrangements for discharge to independent freestanding alcohol detox-we will assist however possible New medication metoprolol this hospitalization  Discharge Diagnoses:  MAIN problem for hospitalization   Acute ethanol withdrawal with DTs on admission  Please see below for itemized issues addressed in HOpsital- refer to other progress notes for clarity if needed  Discharge Condition: Fair  Diet recommendation: Regular  Filed Weights   08/04/23 1652 08/06/23 2051  Weight: 72.6 kg 77 kg    History of present illness:  32 year old white female known heavy binge drinking in the past-clean for a year went to a residential freestanding detox apparently Admitted 08/04/2023 after binge drinking 30-35 drinks per day-she was tachycardic hemodynamically stable but was hallucinating and agitated and was loaded with phenobarb and given 3 doses of the same She was admitted initially to the stepdown unit She had a component of alcoholic hepatitis with AST ALT 267/146 on admission She spent several days hospitalized as below  Hospital Course:  Acute DTs in the setting of binge drinking Noted within [place] kept on Librium protocol Somnolent and has now CIWA scores of 2-4 and does not require further Librium She had a transaminitis secondary to alcoholic hepatitis which trended down on 12/41 2795 INR was low during hospital stay She will need further counseling and has actually looked into this on her own so we can discharge her  Sinus tachycardia, hypertension Blood pressures were quite elevated and she was tachycardic presumably because of her DTs She will probably require outpatient close monitoring and we  have prescribed her metoprolol this hospitalization and called it into her pharmacy Her EF was 70 to 75% with hyperdynamic circulation She was stable for discharge does not require further workup  Hypokalemia on admission Resolved quickly with repletion of both potassium and magnesium Recommend labs in about a week  Abnormal blood smear, thrombocytopenia secondary to EtOH Patient had pappenhimier bodies [not sickling as previously mentioned] on peripheral smear smear was performed platelet morphology was normal Do not recommend any further workup  Discharge Exam: Vitals:   08/07/23 0507 08/07/23 0923  BP: 133/87 (!) 120/96  Pulse: 72 78  Resp: 18 20  Temp: 97.9 F (36.6 C) 98.7 F (37.1 C)  SpO2: 100% 95%    Subj on day of d/c   Sleepy somnolent not really eating drinking some No distress not withdrawing  General Exam on discharge  EOMI NCAT no focal deficit no icterus no pallor Chest is clear no wheeze Abdomen soft no obese no rebound no guarding No lower extremity edema ROM intact   Discharge Instructions   Discharge Instructions     Diet - low sodium heart healthy   Complete by: As directed    Discharge instructions   Complete by: As directed    Please stop drinking completely and seek attention for help with this-you have already coordinated with an outpatient facility and you can probably discharge there Please continue the metoprolol for your heart rate   Increase activity slowly   Complete by: As directed       Allergies as of 08/07/2023       Reactions   Aleve [naproxen Sodium]    wheeze        Medication List  TAKE these medications    metoprolol tartrate 25 MG tablet Commonly known as: LOPRESSOR Take 1 tablet (25 mg total) by mouth 2 (two) times daily.       Allergies  Allergen Reactions   Aleve [Naproxen Sodium]     wheeze      The results of significant diagnostics from this hospitalization (including imaging,  microbiology, ancillary and laboratory) are listed below for reference.    Significant Diagnostic Studies: ECHOCARDIOGRAM COMPLETE  Result Date: 08/06/2023    ECHOCARDIOGRAM REPORT   Patient Name:   Cynthia Salazar Date of Exam: 08/06/2023 Medical Rec #:  [id number]       Height:       62.0 in Accession #:    [id number]      Weight:       160.0 lb Date of Birth:  Nov 10, 1990       BSA:          1.739 m Patient Age:    32 years        BP:           115/73 mmHg Patient Gender: F               HR:           109 bpm. Exam Location:  Inpatient Procedure: 2D Echo, Cardiac Doppler and Color Doppler Indications:    Dyspnea  History:        Patient has no prior history of Echocardiogram examinations.                 Risk Factors:Dyslipidemia.  Sonographer:    Vern Claude Referring Phys: [id number] PRANAV M PATEL IMPRESSIONS  1. Left ventricular ejection fraction, by estimation, is 70 to 75%. The left ventricle has hyperdynamic function. The left ventricle has no regional wall motion abnormalities. Indeterminate diastolic filling due to E-A fusion.  2. Right ventricular systolic function is hyperdynamic. The right ventricular size is normal.  3. The mitral valve is normal in structure. No evidence of mitral valve regurgitation. No evidence of mitral stenosis.  4. The aortic valve was not well visualized. Aortic valve regurgitation is not visualized. No aortic stenosis is present. Comparison(s): No prior Echocardiogram. FINDINGS  Left Ventricle: Left ventricular ejection fraction, by estimation, is 70 to 75%. The left ventricle has hyperdynamic function. The left ventricle has no regional wall motion abnormalities. The left ventricular internal cavity size was normal in size. There is no left ventricular hypertrophy. Indeterminate diastolic filling due to E-A fusion. Right Ventricle: The right ventricular size is normal. No increase in right ventricular wall thickness. Right ventricular systolic function is hyperdynamic. Left  Atrium: Left atrial size was normal in size. Right Atrium: Right atrial size was normal in size. Pericardium: There is no evidence of pericardial effusion. Mitral Valve: The mitral valve is normal in structure. No evidence of mitral valve regurgitation. No evidence of mitral valve stenosis. MV peak gradient, 2.5 mmHg. The mean mitral valve gradient is 2.0 mmHg. Tricuspid Valve: The tricuspid valve is normal in structure. Tricuspid valve regurgitation is not demonstrated. No evidence of tricuspid stenosis. Aortic Valve: The aortic valve was not well visualized. Aortic valve regurgitation is not visualized. No aortic stenosis is present. Aortic valve mean gradient measures 4.0 mmHg. Aortic valve peak gradient measures 8.1 mmHg. Aortic valve area, by VTI measures 4.91 cm. Pulmonic Valve: The pulmonic valve was normal in structure. Pulmonic valve regurgitation is not visualized. No evidence of pulmonic stenosis. Aorta: The aortic root and  ascending aorta are structurally normal, with no evidence of dilitation. IAS/Shunts: No atrial level shunt detected by color flow Doppler.  LEFT VENTRICLE PLAX 2D LVIDd:         2.90 cm     Diastology LVIDs:         2.00 cm     LV e' medial:    8.92 cm/s LV PW:         0.60 cm     LV E/e' medial:  7.9 LV IVS:        0.90 cm     LV e' lateral:   9.14 cm/s LVOT diam:     2.20 cm     LV E/e' lateral: 7.7 LV SV:         89 LV SV Index:   51 LVOT Area:     3.80 cm  LV Volumes (MOD) LV vol d, MOD A2C: 64.2 ml LV vol d, MOD A4C: 68.3 ml LV vol s, MOD A2C: 24.9 ml LV vol s, MOD A4C: 27.7 ml LV SV MOD A2C:     39.3 ml LV SV MOD A4C:     68.3 ml LV SV MOD BP:      40.1 ml RIGHT VENTRICLE RV Basal diam:  2.30 cm RV Mid diam:    1.80 cm RV S prime:     11.00 cm/s TAPSE (M-mode): 2.3 cm LEFT ATRIUM             Index        RIGHT ATRIUM           Index LA diam:        2.10 cm 1.21 cm/m   RA Area:     11.60 cm LA Vol (A2C):   17.4 ml 10.01 ml/m  RA Volume:   24.20 ml  13.92 ml/m LA Vol (A4C):    21.3 ml 12.25 ml/m LA Biplane Vol: 19.7 ml 11.33 ml/m  AORTIC VALVE                     PULMONIC VALVE AV Area (Vmax):    3.88 cm      PV Vmax:       1.12 m/s AV Area (Vmean):   4.17 cm      PV Peak grad:  5.0 mmHg AV Area (VTI):     4.91 cm AV Vmax:           142.00 cm/s AV Vmean:          91.200 cm/s AV VTI:            0.182 m AV Peak Grad:      8.1 mmHg AV Mean Grad:      4.0 mmHg LVOT Vmax:         145.00 cm/s LVOT Vmean:        100.000 cm/s LVOT VTI:          0.235 m LVOT/AV VTI ratio: 1.29  AORTA Ao Root diam: 3.60 cm Ao Asc diam:  2.70 cm MITRAL VALVE MV Area (PHT): 4.15 cm     SHUNTS MV Area VTI:   6.71 cm     Systemic VTI:  0.24 m MV Peak grad:  2.5 mmHg     Systemic Diam: 2.20 cm MV Mean grad:  2.0 mmHg MV Vmax:       0.80 m/s MV Vmean:      61.0 cm/s MV Decel Time: 183 msec MV E velocity: 70.40  cm/s MV A velocity: 111.00 cm/s MV E/A ratio:  0.63 Riley Lam MD Electronically signed by Riley Lam MD Signature Date/Time: 08/06/2023/8:40:15 AM    Final    US Abdomen Limited RUQ (LIVER/GB)  Result Date: 08/04/2023 CLINICAL DATA:  Elevated liver function tests. EXAM: ULTRASOUND ABDOMEN LIMITED RIGHT UPPER QUADRANT COMPARISON:  June 17, 2022 FINDINGS: Gallbladder: No gallstones or wall thickening visualized (1.2 mm). No sonographic Murphy sign noted by sonographer. Common bile duct: Diameter: 2.7 mm Liver: No focal lesion identified. Diffusely increased echogenicity of the liver parenchyma is noted. Portal vein is patent on color Doppler imaging with normal direction of blood flow towards the liver. Other: None. IMPRESSION: Hepatic steatosis without evidence of focal liver lesions. Electronically Signed   By: Aram Candela M.D.   On: 08/04/2023 22:40    Microbiology: Recent Results (from the past 240 hour(s))  MRSA Next Gen by PCR, Nasal     Status: None   Collection Time: 08/05/23  3:08 PM   Specimen: Nasal Mucosa; Nasal Swab  Result Value Ref Range Status   MRSA by  PCR Next Gen NOT DETECTED NOT DETECTED Final    Comment: (NOTE) The GeneXpert MRSA Assay (FDA approved for NASAL specimens only), is one component of a comprehensive MRSA colonization surveillance program. It is not intended to diagnose MRSA infection nor to guide or monitor treatment for MRSA infections. Test performance is not FDA approved in patients less than 98 years old. Performed at [facility], [address], [place], [place] [address]      Labs: Basic Metabolic Panel: Recent Labs  Lab 08/04/23 1725 08/05/23 0553 08/06/23 0324 08/06/23 1428 08/07/23 0409  NA 133* 128* 136 134* 135  K 3.6 3.7 2.8* 3.2* 3.8  CL 95* 93* 99 99 100  CO2 16* 20* 23 27 25   GLUCOSE 105* 58* 99 102* 89  BUN 12 11 <5* <5* <5*  CREATININE 0.71 0.71 0.42* 0.57 0.65  CALCIUM 8.1* 8.3* 7.9* 8.0* 8.5*  MG 2.1  --  1.8  --  2.2  PHOS  --   --  2.2*  --   --    Liver Function Tests: Recent Labs  Lab 08/04/23 1725 08/05/23 0553 08/06/23 0324 08/07/23 0409  AST 267* 199* 129* 127*  ALT 146* 119* 90* 95*  ALKPHOS 69 60 50 53  BILITOT 1.3* 1.2* 1.2* 0.9  PROT 7.6 6.8 5.9* 6.4*  ALBUMIN 4.3 4.0 3.3* 3.5   No results for input(s): "LIPASE", "AMYLASE" in the last 168 hours. No results for input(s): "AMMONIA" in the last 168 hours. CBC: Recent Labs  Lab 08/04/23 1725 08/05/23 0553 08/06/23 0324 08/07/23 0409  WBC 10.6* 5.7 3.8* 3.3*  NEUTROABS 9.4* 4.4 2.6 2.1  HGB 15.0 13.3 13.0 13.5  HCT 43.3 38.8 38.6 41.5  MCV 84.9 86.8 88.1 90.6  PLT 200 121* 99* 75*   Cardiac Enzymes: No results for input(s): "CKTOTAL", "CKMB", "CKMBINDEX", "TROPONINI" in the last 168 hours. BNP: BNP (last 3 results) No results for input(s): "BNP" in the last 8760 hours.  ProBNP (last 3 results) No results for input(s): "PROBNP" in the last 8760 hours.  CBG: Recent Labs  Lab 08/06/23 0324 08/06/23 0823 08/06/23 1210 08/06/23 1606 08/06/23 2019  GLUCAP 103* 98 115* 100* 113*        Signed:  Rhetta Mura MD   [facility] 08/07/2023, 11:03 AM

## 2023-08-07 NOTE — Progress Notes (Signed)
Central tele called to remove pt  from tele- pt is being d/c to home. Pt is going to shower and her mom will be here at 1300 to drive pt to an inpatient substance abuse facility.

## 2023-08-08 ENCOUNTER — Telehealth: Payer: Self-pay

## 2023-08-08 NOTE — Transitions of Care (Post Inpatient/ED Visit) (Signed)
   08/08/2023  Name: Cynthia Salazar MRN: [id number] DOB: [birth year]  Today's TOC FU Call Status: Today's TOC FU Call Status:: Unsuccessful Call (1st Attempt) Unsuccessful Call (1st Attempt) Date: 08/08/23  Attempted to reach the patient regarding the most recent Inpatient/ED visit.  Follow Up Plan: No further outreach attempts will be made at this time. We have been unable to contact the patient. Paitent in rehab Signature Karena Addison, LPN [facility] Nurse Health Advisor Direct Dial [phone]

## 2023-08-09 ENCOUNTER — Telehealth: Payer: Self-pay | Admitting: Family Medicine

## 2023-08-09 ENCOUNTER — Other Ambulatory Visit: Payer: Self-pay | Admitting: Family

## 2023-08-09 MED ORDER — ALBUTEROL SULFATE HFA 108 (90 BASE) MCG/ACT IN AERS: 2.0000 | INHALATION_SPRAY | Freq: Four times a day (QID) | RESPIRATORY_TRACT | 2 refills | Status: AC | PRN

## 2023-08-09 NOTE — Telephone Encounter (Signed)
Pt's mother states she is in a rehab facility and needing a new inhaler. Looks like rx has been discontinued, please advise.    albuterol (VENTOLIN HFA) [facility]) MCG/ACT inhaler   [facility] [address]ervyn Skeeters [place], [place] [address] [phone]

## 2023-10-21 ENCOUNTER — Ambulatory Visit (HOSPITAL_COMMUNITY)
Admission: RE | Admit: 2023-10-21 | Discharge: 2023-10-21 | Disposition: A | Discharge status: Home / Self Care | Payer: Medicaid Other | Source: Ambulatory Visit | Attending: Pediatrics | Admitting: Pediatrics

## 2023-10-21 ENCOUNTER — Other Ambulatory Visit (HOSPITAL_COMMUNITY): Payer: Self-pay | Admitting: Pediatrics

## 2023-10-21 DIAGNOSIS — F10129 Alcohol abuse with intoxication, unspecified: Secondary | ICD-10-CM | POA: Diagnosis not present

## 2023-10-21 DIAGNOSIS — Z79899 Other long term (current) drug therapy: Secondary | ICD-10-CM | POA: Insufficient documentation

## 2023-10-21 DIAGNOSIS — I10 Essential (primary) hypertension: Secondary | ICD-10-CM | POA: Insufficient documentation

## 2023-10-21 DIAGNOSIS — R519 Headache, unspecified: Secondary | ICD-10-CM | POA: Diagnosis present

## 2023-10-21 MED ORDER — IOHEXOL 300 MG/ML  SOLN
100.0000 mL | Freq: Once | INTRAMUSCULAR | Status: AC | PRN
  Administered 2023-10-21: 100 mL via INTRAVENOUS

## 2023-12-06 ENCOUNTER — Other Ambulatory Visit: Payer: Self-pay

## 2023-12-06 ENCOUNTER — Encounter (HOSPITAL_COMMUNITY): Payer: Self-pay | Admitting: Emergency Medicine

## 2023-12-06 ENCOUNTER — Observation Stay (HOSPITAL_COMMUNITY)

## 2023-12-06 ENCOUNTER — Ambulatory Visit (HOSPITAL_COMMUNITY): Admission: EM | Admit: 2023-12-06 | Discharge: 2023-12-06 | Attending: Behavioral Health | Admitting: Behavioral Health

## 2023-12-06 ENCOUNTER — Inpatient Hospital Stay (HOSPITAL_COMMUNITY)
Admission: EM | Admit: 2023-12-06 | Discharge: 2023-12-09 | DRG: 896 | Disposition: A | Discharge status: Home / Self Care | Attending: Internal Medicine | Admitting: Internal Medicine

## 2023-12-06 ENCOUNTER — Emergency Department (HOSPITAL_COMMUNITY)

## 2023-12-06 DIAGNOSIS — K7030 Alcoholic cirrhosis of liver without ascites: Secondary | ICD-10-CM | POA: Diagnosis present

## 2023-12-06 DIAGNOSIS — F10930 Alcohol use, unspecified with withdrawal, uncomplicated: Principal | ICD-10-CM

## 2023-12-06 DIAGNOSIS — F10939 Alcohol use, unspecified with withdrawal, unspecified: Secondary | ICD-10-CM | POA: Diagnosis not present

## 2023-12-06 DIAGNOSIS — F10139 Alcohol abuse with withdrawal, unspecified: Secondary | ICD-10-CM | POA: Diagnosis present

## 2023-12-06 DIAGNOSIS — F32A Depression, unspecified: Secondary | ICD-10-CM | POA: Diagnosis present

## 2023-12-06 DIAGNOSIS — Z825 Family history of asthma and other chronic lower respiratory diseases: Secondary | ICD-10-CM

## 2023-12-06 DIAGNOSIS — K709 Alcoholic liver disease, unspecified: Secondary | ICD-10-CM

## 2023-12-06 DIAGNOSIS — E669 Obesity, unspecified: Secondary | ICD-10-CM | POA: Diagnosis present

## 2023-12-06 DIAGNOSIS — Z7985 Long-term (current) use of injectable non-insulin antidiabetic drugs: Secondary | ICD-10-CM

## 2023-12-06 DIAGNOSIS — J45909 Unspecified asthma, uncomplicated: Secondary | ICD-10-CM | POA: Insufficient documentation

## 2023-12-06 DIAGNOSIS — Z9151 Personal history of suicidal behavior: Secondary | ICD-10-CM | POA: Insufficient documentation

## 2023-12-06 DIAGNOSIS — G4700 Insomnia, unspecified: Secondary | ICD-10-CM | POA: Diagnosis present

## 2023-12-06 DIAGNOSIS — E66811 Obesity, class 1: Secondary | ICD-10-CM | POA: Diagnosis present

## 2023-12-06 DIAGNOSIS — Z8249 Family history of ischemic heart disease and other diseases of the circulatory system: Secondary | ICD-10-CM

## 2023-12-06 DIAGNOSIS — R000 Tachycardia, unspecified: Secondary | ICD-10-CM

## 2023-12-06 DIAGNOSIS — Z87891 Personal history of nicotine dependence: Secondary | ICD-10-CM

## 2023-12-06 DIAGNOSIS — J4520 Mild intermittent asthma, uncomplicated: Secondary | ICD-10-CM | POA: Diagnosis present

## 2023-12-06 DIAGNOSIS — F418 Other specified anxiety disorders: Secondary | ICD-10-CM | POA: Diagnosis present

## 2023-12-06 DIAGNOSIS — E876 Hypokalemia: Secondary | ICD-10-CM | POA: Diagnosis present

## 2023-12-06 DIAGNOSIS — I10 Essential (primary) hypertension: Secondary | ICD-10-CM | POA: Diagnosis present

## 2023-12-06 DIAGNOSIS — K760 Fatty (change of) liver, not elsewhere classified: Secondary | ICD-10-CM | POA: Diagnosis present

## 2023-12-06 DIAGNOSIS — Z886 Allergy status to analgesic agent status: Secondary | ICD-10-CM

## 2023-12-06 DIAGNOSIS — Z602 Problems related to living alone: Secondary | ICD-10-CM | POA: Insufficient documentation

## 2023-12-06 DIAGNOSIS — D689 Coagulation defect, unspecified: Secondary | ICD-10-CM | POA: Diagnosis present

## 2023-12-06 DIAGNOSIS — F909 Attention-deficit hyperactivity disorder, unspecified type: Secondary | ICD-10-CM | POA: Diagnosis present

## 2023-12-06 DIAGNOSIS — F1994 Other psychoactive substance use, unspecified with psychoactive substance-induced mood disorder: Secondary | ICD-10-CM | POA: Diagnosis present

## 2023-12-06 DIAGNOSIS — F10230 Alcohol dependence with withdrawal, uncomplicated: Principal | ICD-10-CM | POA: Diagnosis present

## 2023-12-06 DIAGNOSIS — F411 Generalized anxiety disorder: Secondary | ICD-10-CM | POA: Diagnosis present

## 2023-12-06 DIAGNOSIS — Y909 Presence of alcohol in blood, level not specified: Secondary | ICD-10-CM | POA: Insufficient documentation

## 2023-12-06 DIAGNOSIS — E785 Hyperlipidemia, unspecified: Secondary | ICD-10-CM | POA: Diagnosis present

## 2023-12-06 DIAGNOSIS — D696 Thrombocytopenia, unspecified: Secondary | ICD-10-CM | POA: Diagnosis present

## 2023-12-06 DIAGNOSIS — Z79899 Other long term (current) drug therapy: Secondary | ICD-10-CM

## 2023-12-06 DIAGNOSIS — R7989 Other specified abnormal findings of blood chemistry: Secondary | ICD-10-CM | POA: Diagnosis present

## 2023-12-06 DIAGNOSIS — Z6829 Body mass index (BMI) 29.0-29.9, adult: Secondary | ICD-10-CM

## 2023-12-06 DIAGNOSIS — K852 Alcohol induced acute pancreatitis without necrosis or infection: Secondary | ICD-10-CM | POA: Diagnosis present

## 2023-12-06 DIAGNOSIS — K7010 Alcoholic hepatitis without ascites: Secondary | ICD-10-CM | POA: Diagnosis present

## 2023-12-06 LAB — CBC WITH DIFFERENTIAL/PLATELET
Abs Immature Granulocytes: 0.02 10*3/uL (ref 0.00–0.07)
Basophils Absolute: 0 10*3/uL (ref 0.0–0.1)
Basophils Relative: 0 %
Eosinophils Absolute: 0 10*3/uL (ref 0.0–0.5)
Eosinophils Relative: 0 %
HCT: 37.4 % (ref 36.0–46.0)
Hemoglobin: 12.8 g/dL (ref 12.0–15.0)
Immature Granulocytes: 1 %
Lymphocytes Relative: 11 %
Lymphs Abs: 0.4 10*3/uL — ABNORMAL LOW (ref 0.7–4.0)
MCH: 29.8 pg (ref 26.0–34.0)
MCHC: 34.2 g/dL (ref 30.0–36.0)
MCV: 87.2 fL (ref 80.0–100.0)
Monocytes Absolute: 0.4 10*3/uL (ref 0.1–1.0)
Monocytes Relative: 11 %
Neutro Abs: 2.7 10*3/uL (ref 1.7–7.7)
Neutrophils Relative %: 77 %
Platelets: 36 10*3/uL — ABNORMAL LOW (ref 150–400)
RBC: 4.29 MIL/uL (ref 3.87–5.11)
RDW: 14.1 % (ref 11.5–15.5)
Smear Review: DECREASED
WBC: 3.5 10*3/uL — ABNORMAL LOW (ref 4.0–10.5)
nRBC: 0 % (ref 0.0–0.2)

## 2023-12-06 LAB — COMPREHENSIVE METABOLIC PANEL WITH GFR
ALT: 88 U/L — ABNORMAL HIGH (ref 0–44)
AST: 213 U/L — ABNORMAL HIGH (ref 15–41)
Albumin: 3.6 g/dL (ref 3.5–5.0)
Alkaline Phosphatase: 60 U/L (ref 38–126)
Anion gap: 18 — ABNORMAL HIGH (ref 5–15)
BUN: 6 mg/dL (ref 6–20)
CO2: 20 mmol/L — ABNORMAL LOW (ref 22–32)
Calcium: 7.8 mg/dL — ABNORMAL LOW (ref 8.9–10.3)
Chloride: 99 mmol/L (ref 98–111)
Creatinine, Ser: 0.78 mg/dL (ref 0.44–1.00)
GFR, Estimated: >60 mL/min (ref >60)
Glucose, Bld: 91 mg/dL (ref 70–99)
Potassium: 3.6 mmol/L (ref 3.5–5.1)
Sodium: 137 mmol/L (ref 135–145)
Total Bilirubin: 1.3 mg/dL — ABNORMAL HIGH (ref 0.0–1.2)
Total Protein: 6.3 g/dL — ABNORMAL LOW (ref 6.5–8.1)

## 2023-12-06 LAB — MAGNESIUM
Magnesium: 1.6 mg/dL — ABNORMAL LOW (ref 1.7–2.4)
Magnesium: 1.9 mg/dL (ref 1.7–2.4)

## 2023-12-06 LAB — LIPID PANEL
Cholesterol: 184 mg/dL (ref 0–200)
HDL: 105 mg/dL (ref >40)
LDL Cholesterol: 65 mg/dL (ref 0–99)
Total CHOL/HDL Ratio: 1.8 ratio
Triglycerides: 72 mg/dL (ref <150)
VLDL: 14 mg/dL (ref 0–40)

## 2023-12-06 LAB — HCG, SERUM, QUALITATIVE: Preg, Serum: NEGATIVE

## 2023-12-06 LAB — PROTIME-INR
INR: 1.2 (ref 0.8–1.2)
Prothrombin Time: 15.4 s — ABNORMAL HIGH (ref 11.4–15.2)

## 2023-12-06 LAB — ETHANOL: Alcohol, Ethyl (B): <10 mg/dL (ref <10)

## 2023-12-06 LAB — LIPASE, BLOOD: Lipase: 147 U/L — ABNORMAL HIGH (ref 11–51)

## 2023-12-06 LAB — HEMOGLOBIN A1C
Hgb A1c MFr Bld: 5.2 % (ref 4.8–5.6)
Mean Plasma Glucose: 102.54 mg/dL

## 2023-12-06 LAB — TSH: TSH: 2.222 u[IU]/mL (ref 0.350–4.500)

## 2023-12-06 LAB — HIV ANTIBODY (ROUTINE TESTING W REFLEX): HIV Screen 4th Generation wRfx: NONREACTIVE

## 2023-12-06 MED ORDER — PANTOPRAZOLE SODIUM 40 MG IV SOLR
40.0000 mg | Freq: Two times a day (BID) | INTRAVENOUS | Status: DC
  Administered 2023-12-06 – 2023-12-09 (×7): 40 mg via INTRAVENOUS
  Filled 2023-12-06 (×8): qty 10

## 2023-12-06 MED ORDER — ACETAMINOPHEN 325 MG PO TABS: 650.0000 mg | ORAL_TABLET | Freq: Four times a day (QID) | ORAL | Status: DC | PRN

## 2023-12-06 MED ORDER — LORAZEPAM 1 MG PO TABS: 1.0000 mg | ORAL_TABLET | ORAL | Status: AC | PRN

## 2023-12-06 MED ORDER — MAGNESIUM SULFATE 2 GM/50ML IV SOLN
2.0000 g | Freq: Once | INTRAVENOUS | Status: AC
Start: 2023-12-06 — End: 2023-12-06
  Administered 2023-12-06: 2 g via INTRAVENOUS
  Filled 2023-12-06: qty 50

## 2023-12-06 MED ORDER — ADULT MULTIVITAMIN W/MINERALS CH
1.0000 | ORAL_TABLET | Freq: Every day | ORAL | Status: DC
  Administered 2023-12-06 – 2023-12-09 (×4): 1 via ORAL
  Filled 2023-12-06 (×4): qty 1

## 2023-12-06 MED ORDER — LORAZEPAM 2 MG/ML IJ SOLN
2.0000 mg | Freq: Once | INTRAMUSCULAR | Status: AC
  Administered 2023-12-06: 2 mg via INTRAVENOUS
  Filled 2023-12-06: qty 1

## 2023-12-06 MED ORDER — THIAMINE MONONITRATE 100 MG PO TABS
100.0000 mg | ORAL_TABLET | Freq: Every day | ORAL | Status: DC
  Administered 2023-12-06 – 2023-12-09 (×4): 100 mg via ORAL
  Filled 2023-12-06 (×4): qty 1

## 2023-12-06 MED ORDER — FOLIC ACID 1 MG PO TABS
1.0000 mg | ORAL_TABLET | Freq: Every day | ORAL | Status: DC
Start: 2023-12-06 — End: 2023-12-09
  Administered 2023-12-06 – 2023-12-09 (×4): 1 mg via ORAL
  Filled 2023-12-06 (×4): qty 1

## 2023-12-06 MED ORDER — LORAZEPAM 2 MG/ML IJ SOLN
1.0000 mg | INTRAMUSCULAR | Status: AC | PRN
  Administered 2023-12-07: 1 mg via INTRAVENOUS
  Filled 2023-12-06: qty 1

## 2023-12-06 MED ORDER — SODIUM CHLORIDE 0.9 % IV BOLUS
1000.0000 mL | Freq: Once | INTRAVENOUS | Status: AC
  Administered 2023-12-06: 1000 mL via INTRAVENOUS

## 2023-12-06 MED ORDER — ACETAMINOPHEN 650 MG RE SUPP: 650.0000 mg | Freq: Four times a day (QID) | RECTAL | Status: DC | PRN

## 2023-12-06 MED ORDER — SODIUM CHLORIDE 0.9% FLUSH
3.0000 mL | Freq: Two times a day (BID) | INTRAVENOUS | Status: DC
  Administered 2023-12-06 – 2023-12-09 (×6): 3 mL via INTRAVENOUS

## 2023-12-06 MED ORDER — HYDRALAZINE HCL 20 MG/ML IJ SOLN: 5.0000 mg | Freq: Four times a day (QID) | INTRAMUSCULAR | Status: DC | PRN

## 2023-12-06 MED ORDER — CHLORDIAZEPOXIDE HCL 5 MG PO CAPS
10.0000 mg | ORAL_CAPSULE | Freq: Four times a day (QID) | ORAL | Status: AC
  Administered 2023-12-06 – 2023-12-08 (×6): 10 mg via ORAL
  Filled 2023-12-06 (×7): qty 2

## 2023-12-06 MED ORDER — MORPHINE SULFATE (PF) 2 MG/ML IV SOLN: 2.0000 mg | INTRAVENOUS | Status: DC | PRN

## 2023-12-06 MED ORDER — LACTATED RINGERS IV SOLN: INTRAVENOUS | Status: DC

## 2023-12-06 MED ORDER — SODIUM CHLORIDE 0.9 % IV SOLN
INTRAVENOUS | Status: AC
Start: 2023-12-06 — End: 2023-12-08

## 2023-12-06 MED ORDER — THIAMINE HCL 100 MG/ML IJ SOLN
100.0000 mg | Freq: Every day | INTRAMUSCULAR | Status: DC
  Filled 2023-12-06: qty 2

## 2023-12-06 NOTE — Discharge Instructions (Addendum)
 Transfer to [facility]
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
## 2023-12-06 NOTE — Assessment & Plan Note (Addendum)
 2/2 to ETOH and lipid panel.  Most recent ultrasound in December 2024 showed hepatic steatosis and no mention of stones.

## 2023-12-06 NOTE — ED Provider Notes (Signed)
 [place] EMERGENCY DEPARTMENT AT [facility] Provider Note   CSN: [id number] Arrival date & time: 12/06/23  1109     History  Chief Complaint  Patient presents with   Mental Health Evaluation   ETOH Detox    Cynthia Salazar is a 33 y.o. female.  She has a history of depression and alcohol abuse.  She went to behavioral health urgent care today for worsening depression and alcohol dependence and they found her to be tremulous and tachycardic, hypotensive.  Transferred here for medical evaluation.  She endorses depression.  Drinks 1 pint of alcohol a day.  No history of withdrawal seizures.  Denies any other active medical problems.  Last menstrual period is now.  Denies any overdose.  The history is provided by the patient.  Mental Health Problem Presenting symptoms: depression and suicidal thoughts   Context: alcohol use   Associated symptoms: no abdominal pain and no chest pain   Alcohol Problem This is a recurrent problem. The problem occurs constantly. The problem has not changed since onset.Pertinent negatives include no chest pain and no abdominal pain. Nothing aggravates the symptoms. Nothing relieves the symptoms. She has tried nothing for the symptoms. The treatment provided no relief.       Home Medications Prior to Admission medications   Medication Sig Start Date End Date Taking? Authorizing Provider  albuterol (VENTOLIN HFA) 108 (90 Base) MCG/ACT inhaler Inhale 2 puffs into the lungs every 6 (six) hours as needed for wheezing or shortness of breath. 08/09/23   Eulis Foster, FNP  busPIRone (BUSPAR) 5 MG tablet Take 5 mg by mouth 2 (two) times daily as needed (For anxiety). Patient not taking: Reported on 12/06/2023 10/08/23   [provider]  chlordiazePOXIDE (LIBRIUM) 25 MG capsule Take 25 mg by mouth 3 (three) times daily as needed for anxiety (Take up to 5 days starting on 12/04/23). Patient not taking: Reported on 12/06/2023 12/04/23 12/09/23  [provider]  metoprolol tartrate (LOPRESSOR) 25 MG tablet Take 1 tablet (25 mg total) by mouth 2 (two) times daily. Patient not taking: Reported on 12/06/2023 08/07/23   Rhetta Mura, MD  ondansetron (ZOFRAN-ODT) 4 MG disintegrating tablet Take 4 mg by mouth every 8 (eight) hours as needed for nausea or vomiting.    [provider]  pantoprazole (PROTONIX) 20 MG tablet Take 20 mg by mouth daily. Patient not taking: Reported on 12/06/2023 12/04/23 12/24/23  [provider]  sertraline (ZOLOFT) 25 MG tablet Take 25 mg by mouth daily. Patient not taking: Reported on 12/06/2023 10/08/23   [provider]  WEGOVY 1 MG/0.5ML SOAJ Inject 1 mg into the skin every Monday. 11/19/23   [provider]      Allergies    Aleve [naproxen sodium]    Review of Systems   Review of Systems  Cardiovascular:  Negative for chest pain.  Gastrointestinal:  Negative for abdominal pain.  Psychiatric/Behavioral:  Positive for suicidal ideas.     Physical Exam Updated Vital Signs BP (!) 136/100   Pulse (!) 122   Temp 98.7 F (37.1 C) (Oral)   Resp 19   Ht 5\' 2"  (1.575 m)   Wt 72.6 kg   LMP 12/06/2023   SpO2 97%   BMI 29.26 kg/m  Physical Exam Vitals and nursing note reviewed.  Constitutional:      General: She is not in acute distress.    Appearance: Normal appearance. She is well-developed.  HENT:  Head: Normocephalic and atraumatic.  Eyes:     Conjunctiva/sclera: Conjunctivae normal.  Cardiovascular:     Rate and Rhythm: Regular rhythm. Tachycardia present.     Heart sounds: No murmur heard. Pulmonary:     Effort: Pulmonary effort is normal. No respiratory distress.     Breath sounds: Normal breath sounds.  Abdominal:     Palpations: Abdomen is soft.     Tenderness: There is no abdominal tenderness. There is no guarding or rebound.  Musculoskeletal:        General: No deformity.     Cervical back: Neck supple.  Skin:    General: Skin is warm and dry.      Capillary Refill: Capillary refill takes less than 2 seconds.  Neurological:     General: No focal deficit present.     Mental Status: She is alert.     Comments: tremulous     ED Results / Procedures / Treatments   Labs (all labs ordered are listed, but only abnormal results are displayed) Labs Reviewed  COMPREHENSIVE METABOLIC PANEL WITH GFR - Abnormal; Notable for the following components:      Result Value   CO2 20 (*)    Calcium 7.8 (*)    Total Protein 6.3 (*)    AST 213 (*)    ALT 88 (*)    Total Bilirubin 1.3 (*)    Anion gap 18 (*)    All other components within normal limits  LIPASE, BLOOD - Abnormal; Notable for the following components:   Lipase 147 (*)    All other components within normal limits  CBC WITH DIFFERENTIAL/PLATELET - Abnormal; Notable for the following components:   WBC 3.5 (*)    Platelets 36 (*)    Lymphs Abs 0.4 (*)    All other components within normal limits  MAGNESIUM - Abnormal; Notable for the following components:   Magnesium 1.6 (*)    All other components within normal limits  HCG, SERUM, QUALITATIVE  HEMOGLOBIN A1C  LIPID PANEL  TSH  RAPID URINE DRUG SCREEN, HOSP PERFORMED  ETHANOL  PROTIME-INR  HIV ANTIBODY (ROUTINE TESTING W REFLEX)  MAGNESIUM  COMPREHENSIVE METABOLIC PANEL WITH GFR  CBC  MAGNESIUM  I-STAT CG4 LACTIC ACID, ED  I-STAT CG4 LACTIC ACID, ED    EKG EKG Interpretation Date/Time:  Friday December 06 2023 11:33:14 EDT Ventricular Rate:  120 PR Interval:  139 QRS Duration:  96 QT Interval:  342 QTC Calculation: 484 R Axis:   -34  Text Interpretation: Sinus tachycardia Left axis deviation Low voltage, precordial leads Probable anteroseptal infarct, old Borderline T abnormalities, inferior leads No significant change since prior 12/24 Confirmed by Meridee Score [phone]) on 12/06/2023 11:48:15 AM  Radiology DG Chest 1 View Result Date: 12/06/2023 CLINICAL DATA:  Altered level of consciousness, alcohol  withdrawal EXAM: CHEST  1 VIEW COMPARISON:  07/18/2023 FINDINGS: Single frontal view of the chest demonstrates an unremarkable cardiac silhouette. No acute airspace disease, effusion, or pneumothorax. There are no acute bony abnormalities. IMPRESSION: 1. No acute intrathoracic process. Electronically Signed   By: Sharlet Salina M.D.   On: 12/06/2023 16:56   CT HEAD WO CONTRAST ( ) Result Date: 12/06/2023 CLINICAL DATA:  Delirium. EXAM: CT HEAD WITHOUT CONTRAST TECHNIQUE: Contiguous axial images were obtained from the base of the skull through the vertex without intravenous contrast. RADIATION DOSE REDUCTION: This exam was performed according to the departmental dose-optimization program which includes automated exposure control, adjustment of the mA and/or kV according  to patient size and/or use of iterative reconstruction technique. COMPARISON:  Head CT dated 10/21/2023. FINDINGS: Evaluation of this exam is limited due to motion artifact. Brain: Mild age-related atrophy and chronic microvascular ischemic changes, advanced for the patient's age. There is no acute intracranial hemorrhage. No mass effect or midline shift. No extra-axial fluid collection. Vascular: No hyperdense vessel or unexpected calcification. Skull: Normal. Negative for fracture or focal lesion. Sinuses/Orbits: No acute finding. Other: None IMPRESSION: 1. No acute intracranial pathology. 2. Mild age-related atrophy and chronic microvascular ischemic changes, advanced for the patient's age. Electronically Signed   By: Elgie Collard M.D.   On: 12/06/2023 16:18    Procedures .Critical Care  Performed by: Terrilee Files, MD Authorized by: Terrilee Files, MD   Critical care provider statement:    Critical care time (minutes):  45   Critical care time was exclusive of:  Separately billable procedures and treating other patients   Critical care was necessary to treat or prevent imminent or life-threatening deterioration of the  following conditions:  Circulatory failure, metabolic crisis and toxidrome   Critical care was time spent personally by me on the following activities:  Development of treatment plan with patient or surrogate, discussions with consultants, evaluation of patient's response to treatment, examination of patient, obtaining history from patient or surrogate, ordering and performing treatments and interventions, ordering and review of laboratory studies, ordering and review of radiographic studies, pulse oximetry, re-evaluation of patient's condition and review of old charts   I assumed direction of critical care for this patient from another provider in my specialty: no       Medications Ordered in ED Medications  LORazepam (ATIVAN) injection 2 mg (has no administration in time range)  LORazepam (ATIVAN) tablet 1-4 mg (has no administration in time range)    Or  LORazepam (ATIVAN) injection 1-4 mg (has no administration in time range)  thiamine (VITAMIN B1) tablet 100 mg (has no administration in time range)    Or  thiamine (VITAMIN B1) injection 100 mg (has no administration in time range)  folic acid (FOLVITE) tablet 1 mg (has no administration in time range)  multivitamin with minerals tablet 1 tablet (has no administration in time range)    ED Course/ Medical Decision Making/ A&P Clinical Course as of 12/06/23 1753  Fri Dec 06, 2023  1208 Patient now stating that she would like to be discharged.  She said a friend is coming up and will pick her up.  Denies that she is suicidal at this time.  Will give her a dose of Ativan and wait for the friend to come up to see if this person can reason with her. [MB]  1213 Prior records patient has multiple visits to [facility] for similar. [MB]  1239 Patient's friend is here and trying to talk the patient into staying. [MB]  1428 Discussed with [facility] hospitalist Dr. Allena Katz who will evaluate patient for admission [MB]    Clinical Course User  Index [MB] Terrilee Files, MD                                 Medical Decision Making Amount and/or Complexity of Data Reviewed Labs: ordered.  Risk OTC drugs. Prescription drug management. Decision regarding hospitalization.   This patient complains of alcohol withdrawal depression; this involves an extensive number of treatment Options and is a complaint that carries with it a high risk  of complications and morbidity. The differential includes overdose, withdrawal, metabolic derangement, anxiety, liver failure  I ordered, reviewed and interpreted labs, which included CBC with low white count low platelets, chemistries with low bicarb, elevated LFTs, magnesium low I ordered medication IV benzos, vitamins and fluids, IV magnesium and reviewed PMP when indicated. I ordered imaging studies which included chest x-ray and I independently    visualized and interpreted imaging which showed no acute findings Additional history obtained from patient's companions Previous records obtained and reviewed in epic including multiple visits to [facility] I consulted [facility] hospitalist Dr. Allena Katz and discussed lab and imaging findings and discussed disposition.  Cardiac monitoring reviewed, sinus tachycardia Social determinants considered, alcohol use Critical Interventions: Initiation of benzos fluids vitamins for alcohol withdrawal  After the interventions stated above, I reevaluated the patient and found patient to be less hyperdynamic Admission and further testing considered, she would benefit from medical admission for further management.  She is in agreement with plan for admission.         Final Clinical Impression(s) / ED Diagnoses Final diagnoses:  Alcohol withdrawal syndrome without complication (HCC)  Sinus tachycardia  Alcoholic liver disease (HCC)  Thrombocytopenia (HCC)    Rx / DC Orders ED Discharge Orders     None         Terrilee Files, MD 12/06/23 1756
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
## 2023-12-06 NOTE — Assessment & Plan Note (Addendum)
    Latest Ref Rng & Units 12/06/2023   11:53 AM 08/07/2023    4:09 AM 08/06/2023    3:24 AM  Hepatic Function  Total Protein 6.5 - 8.1 g/dL 6.3  6.4  5.9   Albumin 3.5 - 5.0 g/dL 3.6  3.5  3.3   AST 15 - 41 U/L 213  127  129   ALT 0 - 44 U/L 88  95  90   Alk Phosphatase 38 - 126 U/L 60  53  50   Total Bilirubin 0.0 - 1.2 mg/dL 1.3  0.9  1.2   2/2 alcoholic hepatitis. Will follow lipase.

## 2023-12-06 NOTE — Assessment & Plan Note (Addendum)
 2/2 to ETOH abuse. Acute hepatitis panel. Will follow platelet and INR.

## 2023-12-06 NOTE — Assessment & Plan Note (Addendum)
 Last drink was 5 AM, patient was tremulous and withdrawing on arrival to the emergency room.  Patient typically drinks a pint of vodka daily. Will admit to stepdown unit with alcohol withdrawal protocol and CIWA with scheduled Librium for the first 48 hours. Thiamine loading dose followed by daily 100 IV or p.o. depending on patient's sedation level and mental status. Follow electrolytes especially magnesium replace. Aspiration and seizure precautions. Fall precautions. Substance abuse counseling and psych referral requested per friend will defer to a.m. team for social work consult and substance abuse resources.

## 2023-12-06 NOTE — H&P (Addendum)
 History and Physical    Patient: Cynthia Salazar EXB:[id number] DOB: July 23, 1991 DOA: 12/06/2023 DOS: the patient was seen and examined on 12/06/2023 PCP: Bradd Canary, MD  Patient coming from: Home Chief complaint: Chief Complaint  Patient presents with   Mental Health Evaluation   ETOH Detox   HPI:  Cynthia Salazar is a 33 y.o. female with past medical history  of alcohol abuse alcohol withdrawal depression essential hypertension obesity, cirrhosis and liver imaging.  Has been drinking since the age of 58 currently drinks about a pint of vodka per day has had 4 DWIs.  Friends at bedside was also in her charge support group states that she has been having issues with her parents and family issues.  Patient is currently not able to work because of her drinking addiction.  HPI is otherwise limited and per chart review.  ED Course: Pt in ed at bedside  is sedated as she received Ativan in the emergency room.  Vitals are stable afebrile O2 sats intermittently has been 90 to 92%. Vital signs in the ED were notable for the following:  Vitals:   12/06/23 1330 12/06/23 1345 12/06/23 1500 12/06/23 1533  BP: 118/78 113/85 111/76   Pulse: (!) 124 (!) 121 (!) 118   Temp:    98.8 F (37.1 C)  Resp: (!) 24 (!) 28 14   Height:      Weight:      SpO2: 92% 95% 96%   TempSrc:    Oral  BMI (Calculated):       >>Labs were notable for the following: CMP shows bicarb of 20 anion gap of 18 normal creatinine abnormal LFTs with AST of 213 ALT of 88 total bili 1.3. Magnesium of 1.6. Urine pregnancy test negative. CBC shows a white count of 3.5 thrombocytopenia of 36 >>EKG: Independently reviewed:   >>While in the ED patient received the following: Medications  LORazepam (ATIVAN) tablet 1-4 mg (has no administration in time range)    Or  LORazepam (ATIVAN) injection 1-4 mg (has no administration in time range)  thiamine (VITAMIN B1) tablet 100 mg (100 mg Oral Given 12/06/23 1205)    Or  thiamine  (VITAMIN B1) injection 100 mg ( Intravenous See Alternative 12/06/23 1205)  folic acid (FOLVITE) tablet 1 mg (1 mg Oral Given 12/06/23 1205)  multivitamin with minerals tablet 1 tablet (1 tablet Oral Given 12/06/23 1205)  pantoprazole (PROTONIX) injection 40 mg (has no administration in time range)  chlordiazePOXIDE (LIBRIUM) capsule 10 mg (has no administration in time range)  LORazepam (ATIVAN) injection 2 mg (2 mg Intravenous Given 12/06/23 1207)  magnesium sulfate IVPB 2 g 50 mL (2 g Intravenous New Bag/Given 12/06/23 1408)  sodium chloride 0.9 % bolus 1,000 mL (1,000 mLs Intravenous New Bag/Given 12/06/23 1402)   Review of Systems  Unable to perform ROS: Patient nonverbal   Past Medical History:  Diagnosis Date   Abnormal liver function test 03/18/2013   Alcohol use 06/03/2015   Allergy    Asthma    Chicken pox as a child   Depression with anxiety    Essential hypertension 06/03/2015   Insomnia 03/17/2013   Other and unspecified hyperlipidemia 03/18/2013   Past Surgical History:  Procedure Laterality Date   WISDOM TOOTH EXTRACTION  19 yrs    reports that she has quit smoking. She has never used smokeless tobacco. She reports current alcohol use. She reports current drug use. Drug: Marijuana.  Allergies  Allergen Reactions   Aleve [Naproxen Sodium]  Other (See Comments)    wheeze    Family History  Problem Relation Age of Onset   Nephrolithiasis Father    Hypertension Father    Nephrolithiasis Brother    COPD Paternal Grandmother     Prior to Admission medications   Medication Sig Start Date End Date Taking? Authorizing Provider  albuterol (VENTOLIN HFA) 108 (90 Base) MCG/ACT inhaler Inhale 2 puffs into the lungs every 6 (six) hours as needed for wheezing or shortness of breath. 08/09/23   Eulis Foster, FNP  busPIRone (BUSPAR) 5 MG tablet Take 5 mg by mouth 2 (two) times daily as needed (For anxiety). Patient not taking: Reported on 12/06/2023 10/08/23   [provider]   chlordiazePOXIDE (LIBRIUM) 25 MG capsule Take 25 mg by mouth 3 (three) times daily as needed for anxiety (Take up to 5 days starting on 12/04/23). Patient not taking: Reported on 12/06/2023 12/04/23 12/09/23  [provider]  metoprolol tartrate (LOPRESSOR) 25 MG tablet Take 1 tablet (25 mg total) by mouth 2 (two) times daily. Patient not taking: Reported on 12/06/2023 08/07/23   Rhetta Mura, MD  ondansetron (ZOFRAN-ODT) 4 MG disintegrating tablet Take 4 mg by mouth every 8 (eight) hours as needed for nausea or vomiting.    [provider]  pantoprazole (PROTONIX) 20 MG tablet Take 20 mg by mouth daily. Patient not taking: Reported on 12/06/2023 12/04/23 12/24/23  [provider]  sertraline (ZOLOFT) 25 MG tablet Take 25 mg by mouth daily. Patient not taking: Reported on 12/06/2023 10/08/23   [provider]  WEGOVY 1 MG/0.5ML SOAJ Inject 1 mg into the skin every Monday. 11/19/23   [provider]                                                                                   Vitals:   12/06/23 1330 12/06/23 1345 12/06/23 1500 12/06/23 1533  BP: 118/78 113/85 111/76   Pulse: (!) 124 (!) 121 (!) 118   Resp: (!) 24 (!) 28 14   Temp:    98.8 F (37.1 C)  TempSrc:    Oral  SpO2: 92% 95% 96%   Weight:      Height:       Physical Exam Vitals and nursing note reviewed.  Constitutional:      General: She is not in acute distress.    Appearance: She is obese. She is not ill-appearing.  HENT:     Head: Normocephalic and atraumatic.     Right Ear: Hearing normal.     Left Ear: Hearing normal.     Nose: Nose normal. No nasal deformity.     Mouth/Throat:     Lips: Pink.     Mouth: Mucous membranes are dry.     Tongue: No lesions.     Pharynx: Oropharynx is clear.  Eyes:     General: Lids are normal.     Extraocular Movements: Extraocular movements intact.  Cardiovascular:     Rate and Rhythm: Normal rate and regular rhythm.     Heart sounds: Normal  heart sounds.  Pulmonary:     Effort: Pulmonary effort is normal.  Breath sounds: Normal breath sounds.  Abdominal:     General: Bowel sounds are normal. There is no distension.     Palpations: Abdomen is soft. There is no mass.     Tenderness: There is no abdominal tenderness.  Musculoskeletal:     Right lower leg: No edema.     Left lower leg: No edema.  Skin:    General: Skin is warm.  Neurological:     Comments: CN grossly intact.    Psychiatric:        Speech: Speech is delayed.     Comments: Sedated.    Labs on Admission: I have personally reviewed following labs and imaging studies  CBC: Recent Labs  Lab 12/06/23 1153  WBC 3.5*  NEUTROABS 2.7  HGB 12.8  HCT 37.4  MCV 87.2  PLT 36*   Basic Metabolic Panel: Recent Labs  Lab 12/06/23 1153  NA 137  K 3.6  CL 99  CO2 20*  GLUCOSE 91  BUN 6  CREATININE 0.78  CALCIUM 7.8*  MG 1.6*   GFR: Estimated Creatinine Clearance: 92.4 mL/min (by C-G formula based on SCr of 0.78 mg/dL). Liver Function Tests: Recent Labs  Lab 12/06/23 1153  AST 213*  ALT 88*  ALKPHOS 60  BILITOT 1.3*  PROT 6.3*  ALBUMIN 3.6   Recent Labs  Lab 12/06/23 1153  LIPASE 147*   Radiological Exams on Admission: Head CT ordered and pending. Chest x-ray ordered and pending. Data Reviewed: Relevant notes from primary care and specialist visits, past discharge summaries as available in EHR, including Care Everywhere. Prior diagnostic testing as pertinent to current admission diagnoses, Updated medications and problem lists for reconciliation ED course, including vitals, labs, imaging, treatment and response to treatment,Triage notes, nursing and pharmacy notes and ED provider's notes Notable results as noted in HPI.Discussed case with EDMD/ ED APP/ or Specialty MD on call and as needed. Assessment & Plan Alcohol withdrawal (HCC) Last drink was 5 AM, patient was tremulous and withdrawing on arrival to the emergency room.  Patient  typically drinks a pint of vodka daily. Will admit to stepdown unit with alcohol withdrawal protocol and CIWA with scheduled Librium for the first 48 hours. Thiamine loading dose followed by daily 100 IV or p.o. depending on patient's sedation level and mental status. Follow electrolytes especially magnesium replace. Aspiration and seizure precautions. Fall precautions. Substance abuse counseling and psych referral requested per friend will defer to a.m. team for social work consult and substance abuse resources.  Depression with anxiety Patient is supposedly on BuSpar and sertraline med reconciliation is pending.  Currently patient is n.p.o. for aspiration prevention due to sedation.  Will resume home meds once patient is  awake. Mild intermittent asthma O2 sats of 90 to 92% on bedside monitor.  As needed albuterol will obtain a baseline chest x-ray as there is a risk patient may have aspirated.  Elevated LFTs    Latest Ref Rng & Units 12/06/2023   11:53 AM 08/07/2023    4:09 AM 08/06/2023    3:24 AM  Hepatic Function  Total Protein 6.5 - 8.1 g/dL 6.3  6.4  5.9   Albumin 3.5 - 5.0 g/dL 3.6  3.5  3.3   AST 15 - 41 U/L 213  127  129   ALT 0 - 44 U/L 88  95  90   Alk Phosphatase 38 - 126 U/L 60  53  50   Total Bilirubin 0.0 - 1.2 mg/dL 1.3  0.9  1.2   2/2 alcoholic hepatitis. Will follow lipase.   Obesity (BMI 30.0-34.9) A1c/ tsh.  Hypomagnesemia Pt received mag in ed.   Pancreatitis, alcoholic, acute 2/2 to ETOH and lipid panel.  Most recent ultrasound in December 2024 showed hepatic steatosis and no mention of stones.  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 Acute hepatitis panel. Will follow platelet and INR.   DVT prophylaxis:  SCD's. Consults:  None.  Advance Care Planning:    Code Status: Prior   Family Communication:  Friend at bedside.  Disposition Plan:  TBD. Severity of Illness: The appropriate patient status for this patient is OBSERVATION. Observation  status is judged to be reasonable and necessary in order to provide the required intensity of service to ensure the patient's safety. The patient's presenting symptoms, physical exam findings, and initial radiographic and laboratory data in the context of their medical condition is felt to place them at decreased risk for further clinical deterioration. Furthermore, it is anticipated that the patient will be medically stable for discharge from the hospital within 2 midnights of admission.   Author: Gertha Calkin, MD 12/06/2023 3:45 PM For any abnormal results or patient question or concern past 8PM please review AMION call schedule and call the assigned MD for [facility] Floor coverage. For on call review [email or web address].   Unresulted Labs (From admission, onward)     Start     Ordered   12/06/23 1546  Protime-INR  Add-on,   AD        12/06/23 1545   12/06/23 1512  Lipid panel  Add-on,   AD        12/06/23 1512   12/06/23 1512  TSH  Add-on,   AD        12/06/23 1512   12/06/23 1511  Hemoglobin A1c  Add-on,   AD        12/06/23 1512   12/06/23 1133  Ethanol  Once,   URGENT        12/06/23 1132   12/06/23 1132  Urine rapid drug screen (hosp performed)  Once,   STAT        12/06/23 1132            Orders Placed This Encounter  Procedures   DG Chest 1 View   CT HEAD WO CONTRAST ( )   Comprehensive metabolic panel   Lipase, blood   CBC with Differential   Magnesium   hCG, serum, qualitative   Urine rapid drug screen (hosp performed)   Ethanol   Hemoglobin A1c   Lipid panel   TSH   Protime-INR   ED Cardiac monitoring   Vital signs every 6 hours X 48 hours, then per unit protocol   Refer to Sidebar Report for reference: ETOH Withdrawal Guidelines   Clinical Institute Withdrawal Assessent (CIWA)   If Ativan given, reassess [facility] Institute Withdrawal Assessment (CIWA) with blood pressure and pulse rate within 1 hour of Ativan administration   Notify Pharmacy to change IV Ativan to PO  if tolerating POs well.   Notify physician (specify)   Consult for Unassigned Medical Admission   Consult to hospitalist   I-Stat CG4 Lactic Acid   ED EKG   EKG 12-Lead

## 2023-12-06 NOTE — ED Notes (Signed)
 Report given to [facility] ED Charge RN, Maralyn Sago.

## 2023-12-06 NOTE — ED Notes (Signed)
 Nt called CCMD to put pt on monitor

## 2023-12-06 NOTE — Plan of Care (Signed)

## 2023-12-06 NOTE — Assessment & Plan Note (Addendum)
 Patient is supposedly on BuSpar and sertraline med reconciliation is pending.  Currently patient is n.p.o. for aspiration prevention due to sedation.  Will resume home meds once patient is  awake.

## 2023-12-06 NOTE — ED Triage Notes (Signed)
 Pt BIB [facility] from [facility]untary for mental health  Etoh Detox evaluation. Ems reported patient drink a pint of liquor a day. She had a shot this morning. Patient present to arms tremors and she is tachycardic. EMS started patient  on NS 500. Vital signs: BP 150/100, HR 125, saturation 96% RA, blood sugar 99.

## 2023-12-06 NOTE — Assessment & Plan Note (Addendum)
A1c/ tsh.  

## 2023-12-06 NOTE — ED Notes (Signed)
 Patient discharged to [facility] ED for medical clearance.

## 2023-12-06 NOTE — Assessment & Plan Note (Addendum)
 O2 sats of 90 to 92% on bedside monitor.  As needed albuterol will obtain a baseline chest x-ray as there is a risk patient may have aspirated.

## 2023-12-06 NOTE — Assessment & Plan Note (Addendum)
 Pt received mag in ed.

## 2023-12-06 NOTE — ED Notes (Addendum)
EMS called

## 2023-12-07 DIAGNOSIS — F10239 Alcohol dependence with withdrawal, unspecified: Secondary | ICD-10-CM | POA: Diagnosis not present

## 2023-12-07 DIAGNOSIS — E876 Hypokalemia: Secondary | ICD-10-CM | POA: Diagnosis present

## 2023-12-07 DIAGNOSIS — E669 Obesity, unspecified: Secondary | ICD-10-CM | POA: Diagnosis present

## 2023-12-07 DIAGNOSIS — F1994 Other psychoactive substance use, unspecified with psychoactive substance-induced mood disorder: Secondary | ICD-10-CM | POA: Diagnosis present

## 2023-12-07 DIAGNOSIS — E785 Hyperlipidemia, unspecified: Secondary | ICD-10-CM | POA: Diagnosis present

## 2023-12-07 DIAGNOSIS — Z886 Allergy status to analgesic agent status: Secondary | ICD-10-CM | POA: Diagnosis not present

## 2023-12-07 DIAGNOSIS — G4700 Insomnia, unspecified: Secondary | ICD-10-CM | POA: Diagnosis present

## 2023-12-07 DIAGNOSIS — F411 Generalized anxiety disorder: Secondary | ICD-10-CM | POA: Diagnosis present

## 2023-12-07 DIAGNOSIS — I10 Essential (primary) hypertension: Secondary | ICD-10-CM | POA: Diagnosis present

## 2023-12-07 DIAGNOSIS — D696 Thrombocytopenia, unspecified: Secondary | ICD-10-CM | POA: Diagnosis present

## 2023-12-07 DIAGNOSIS — F10139 Alcohol abuse with withdrawal, unspecified: Secondary | ICD-10-CM | POA: Diagnosis not present

## 2023-12-07 DIAGNOSIS — Z8249 Family history of ischemic heart disease and other diseases of the circulatory system: Secondary | ICD-10-CM | POA: Diagnosis not present

## 2023-12-07 DIAGNOSIS — F1024 Alcohol dependence with alcohol-induced mood disorder: Secondary | ICD-10-CM | POA: Diagnosis not present

## 2023-12-07 DIAGNOSIS — Z6829 Body mass index (BMI) 29.0-29.9, adult: Secondary | ICD-10-CM | POA: Diagnosis not present

## 2023-12-07 DIAGNOSIS — Z7985 Long-term (current) use of injectable non-insulin antidiabetic drugs: Secondary | ICD-10-CM | POA: Diagnosis not present

## 2023-12-07 DIAGNOSIS — K7030 Alcoholic cirrhosis of liver without ascites: Secondary | ICD-10-CM | POA: Diagnosis present

## 2023-12-07 DIAGNOSIS — K852 Alcohol induced acute pancreatitis without necrosis or infection: Secondary | ICD-10-CM | POA: Diagnosis present

## 2023-12-07 DIAGNOSIS — F909 Attention-deficit hyperactivity disorder, unspecified type: Secondary | ICD-10-CM | POA: Diagnosis present

## 2023-12-07 DIAGNOSIS — F10230 Alcohol dependence with withdrawal, uncomplicated: Secondary | ICD-10-CM | POA: Diagnosis present

## 2023-12-07 DIAGNOSIS — D689 Coagulation defect, unspecified: Secondary | ICD-10-CM | POA: Diagnosis present

## 2023-12-07 DIAGNOSIS — Z79899 Other long term (current) drug therapy: Secondary | ICD-10-CM | POA: Diagnosis not present

## 2023-12-07 DIAGNOSIS — K7010 Alcoholic hepatitis without ascites: Secondary | ICD-10-CM | POA: Diagnosis present

## 2023-12-07 DIAGNOSIS — F32A Depression, unspecified: Secondary | ICD-10-CM | POA: Diagnosis present

## 2023-12-07 DIAGNOSIS — K760 Fatty (change of) liver, not elsewhere classified: Secondary | ICD-10-CM | POA: Diagnosis present

## 2023-12-07 DIAGNOSIS — J4520 Mild intermittent asthma, uncomplicated: Secondary | ICD-10-CM | POA: Diagnosis present

## 2023-12-07 DIAGNOSIS — Z87891 Personal history of nicotine dependence: Secondary | ICD-10-CM | POA: Diagnosis not present

## 2023-12-07 LAB — CBC
HCT: 35.4 % — ABNORMAL LOW (ref 36.0–46.0)
Hemoglobin: 12.4 g/dL (ref 12.0–15.0)
MCH: 30.8 pg (ref 26.0–34.0)
MCHC: 35 g/dL (ref 30.0–36.0)
MCV: 87.8 fL (ref 80.0–100.0)
Platelets: 25 10*3/uL — CL (ref 150–400)
RBC: 4.03 MIL/uL (ref 3.87–5.11)
RDW: 13.7 % (ref 11.5–15.5)
WBC: 2.3 10*3/uL — ABNORMAL LOW (ref 4.0–10.5)
nRBC: 0 % (ref 0.0–0.2)

## 2023-12-07 LAB — RAPID URINE DRUG SCREEN, HOSP PERFORMED
Amphetamines: NOT DETECTED
Barbiturates: NOT DETECTED
Benzodiazepines: POSITIVE — AB
Cocaine: NOT DETECTED
Opiates: NOT DETECTED
Tetrahydrocannabinol: POSITIVE — AB

## 2023-12-07 LAB — COMPREHENSIVE METABOLIC PANEL WITH GFR
ALT: 80 U/L — ABNORMAL HIGH (ref 0–44)
AST: 208 U/L — ABNORMAL HIGH (ref 15–41)
Albumin: 3 g/dL — ABNORMAL LOW (ref 3.5–5.0)
Alkaline Phosphatase: 56 U/L (ref 38–126)
Anion gap: 13 (ref 5–15)
BUN: <5 mg/dL — ABNORMAL LOW (ref 6–20)
CO2: 24 mmol/L (ref 22–32)
Calcium: 7.5 mg/dL — ABNORMAL LOW (ref 8.9–10.3)
Chloride: 94 mmol/L — ABNORMAL LOW (ref 98–111)
Creatinine, Ser: 0.8 mg/dL (ref 0.44–1.00)
GFR, Estimated: >60 mL/min (ref >60)
Glucose, Bld: 79 mg/dL (ref 70–99)
Potassium: 3 mmol/L — ABNORMAL LOW (ref 3.5–5.1)
Sodium: 131 mmol/L — ABNORMAL LOW (ref 135–145)
Total Bilirubin: 1.6 mg/dL — ABNORMAL HIGH (ref 0.0–1.2)
Total Protein: 5.5 g/dL — ABNORMAL LOW (ref 6.5–8.1)

## 2023-12-07 LAB — MAGNESIUM: Magnesium: 1.5 mg/dL — ABNORMAL LOW (ref 1.7–2.4)

## 2023-12-07 MED ORDER — POTASSIUM CHLORIDE 20 MEQ PO PACK
40.0000 meq | PACK | Freq: Two times a day (BID) | ORAL | Status: AC
  Administered 2023-12-07 (×2): 40 meq via ORAL
  Filled 2023-12-07 (×2): qty 2

## 2023-12-07 MED ORDER — HYDROXYZINE HCL 25 MG PO TABS
50.0000 mg | ORAL_TABLET | Freq: Every evening | ORAL | Status: DC | PRN
  Administered 2023-12-09: 50 mg via ORAL
  Filled 2023-12-07: qty 2

## 2023-12-07 MED ORDER — ACAMPROSATE CALCIUM 333 MG PO TBEC
666.0000 mg | DELAYED_RELEASE_TABLET | Freq: Three times a day (TID) | ORAL | Status: DC
  Administered 2023-12-08 – 2023-12-09 (×5): 666 mg via ORAL
  Filled 2023-12-07 (×6): qty 2

## 2023-12-07 MED ORDER — MAGNESIUM SULFATE 2 GM/50ML IV SOLN
2.0000 g | Freq: Once | INTRAVENOUS | Status: AC
Start: 2023-12-07 — End: 2023-12-07
  Administered 2023-12-07: 2 g via INTRAVENOUS
  Filled 2023-12-07: qty 50

## 2023-12-07 NOTE — Consult Note (Signed)
 [facility] Psychiatric Consult Initial  Patient Name: .Cynthia Salazar  MRN: [id number]  DOB: Nov 01, 1990  Consult Order details:  Orders (From admission, onward)     Start     Ordered   12/06/23 2146  IP CONSULT TO PSYCHIATRY       Ordering Provider: Gertha Calkin, MD  Provider:  (Not yet assigned)  Question Answer Comment  Location [facility]   Reason for Consult? Alcohol abuse / depression.      12/06/23 2145             Mode of Visit: In person    Psychiatry Consult Evaluation  Service Date: December 07, 2023 LOS:  LOS: 0 days  Chief Complaint "I feel tired"  Primary Psychiatric Diagnoses  Alcohol use disorder severe with withdrawal 2. Substance induced mood disorer.  Assessment  Cynthia Salazar is a 33 y.o. female admitted: Medicallyfor 12/06/2023 11:09 AM for alcohol withdrawal. She carries the psychiatric diagnoses of alcohol use disorder, depression, anxiety and has a past medical history of  essential hypertension obesity, cirrhosis and liver imaging.     Her current presentation of alcohol withdrawal is most consistent with alcohol use disorer severe with withdrawal and substance induced mood disorder. Patient states she previously took Zoloft but stopped taking it 2 months ago due to headaches. Patient states when she was sober for a year her mood was good and she was not suffering from symptoms of depression or anxiety. Given her severe thrombocytopenia less than 30 today I think the risk of SSRI outweighs benefit especially given that her mood symptoms are substance induced per her history.  Discussed r/b/a of trial of acamprosate for alcohol cessation and patient expressed interest in starting thus will start. Naltrexone is inappropriate due to liver cirrhosisPlease see plan below for detailed recommendations.   Denies SI or HI, denies AVH. Denies prior suicide attempts. Future oriented on sobriety. Although only superficially interested at this time.  Does not meet IVC criteria. Low acute risk for suicide.   Diagnoses:  Active Hospital problems: Principal Problem:   Alcohol withdrawal (HCC) Active Problems:   Depression with anxiety   Mild intermittent asthma   Elevated LFTs   Obesity (BMI 30.0-34.9)   Hypomagnesemia   Pancreatitis, alcoholic, acute   Cirrhosis, alcoholic (HCC)   Alcohol abuse with withdrawal (HCC)    Plan   ## Psychiatric Medication Recommendations:  - recommend switch of Librium to Ativan taper given patient's liver cirrhosis (lorazepam, oxazepam and temazepam bypass first pass metabolism), agree with CIWA -PRN supplementary ativan dosing Started acamprosate 666 mg TID for alcohol cessation - don't recommend SSRI due to antiplatelet effect and increased risk for bleeding with platelets under 30k as well as patient denying mood symptoms when she was sober  ## Medical Decision Making Capacity:  patient has decision making capacity    ## Disposition:-- There are no psychiatric contraindications to discharge at this time, recommending inpatient rehab for patient given severity of alcohol use disorder and patient's multiple admissions as well as DUIs  ## Behavioral / Environmental: - No specific recommendations at this time.     ## Safety and Observation Level:  - Based on my clinical evaluation, I estimate the patient to be at low risk of self harm in the current setting. - At this time, we recommend  routine. This decision is based on my review of the chart including patient's history and current presentation, interview of the patient, mental status examination, and consideration of  suicide risk including evaluating suicidal ideation, plan, intent, suicidal or self-harm behaviors, risk factors, and protective factors. This judgment is based on our ability to directly address suicide risk, implement suicide prevention strategies, and develop a safety plan while the patient is in the clinical setting. Please  contact our team if there is a concern that risk level has changed.  CSSR Risk Category:C-SSRS RISK CATEGORY: Error: Question 1 not populated  Suicide Risk Assessment: Patient has following modifiable risk factors for suicide: recklessness, which we are addressing by recommending inpatient rehab. Patient has following non-modifiable or demographic risk factors for suicide: none Patient has the following protective factors against suicide: Access to outpatient mental health care, Supportive family, no history of suicide attempts, and no history of NSSIB  Thank you for this consult request. Recommendations have been communicated to the primary team.  We will followup for tolerance of medication at this time.   Miguel Rota, MD       History of Present Illness  Cynthia Salazar is a 33 y.o. female admitted: Medicallyfor 12/06/2023 11:09 AM for alcohol withdrawal. She carries the psychiatric diagnoses of alcohol use disorder, depression, anxiety and has a past medical history of  essential hypertension obesity, cirrhosis and liver imaging.  Patient admitted for severe thrombocytopenia with platelets 25 likely secondary to liver cirrhosis as well as alcohol         hepatitis and possible  pancreatitis.   Patient Report:  Patient has been  drinking since the age of 33 currently drinks about a pint of vodka per day has had 4 DWIs.  Patient is currently not able to work because of her drinking addiction.  Patient states she has had mood symptoms since age of 33 which she states resolved when she was able to stay sober for a year in residential program from October 2023 - 2024. Patient states she relapsed shortly after that and has become depressed with intermittent hopelessness and depressed mood most days. Notes erratic sleep and appetite in context of daily drinking. Patient denies panic attacks. Denies SI, HI or AVH. Denies wanting to be dead. I offered patient residential rehab and she declines stating  she has two places in mind near her house, however declined to go there. I also offered patient CD-IOP and she declines. Appears superficially interested and she told me she wants to quit. I discussed her multiple medical complications and that further drinking will likely kill her over time and patient acknowledged and expressed interest in sobriety noting she had completed a 6 month residential program in addition to more recent year long program above at [facility] in [place], [place]. She does report history of trauma however declined to discuss and denies nightmares, nighttimes awakenings or avoidance the past 2 months.   Psych Review of Symptoms:  ADHD: Patient denied any symptoms.     Anxiety:  Generalized Anxiety Symptoms: Difficulty controlling worry, difficulty with concentration and sleep disturbances due to anxiety.  Additional Anxiety Symptoms: No panic attacks.     Depressive Symptoms:  Depressed mood, decreased interest, fatigue, hopelessness, insomnia and low self esteem. No psychomotor retardation and no suicidal ideation.      Psych ROS:  Depression: as above Anxiety:  reports anxiety when withdrawing from alcohol but not in the year during which she was sober Mania (lifetime and current): denies Psychosis: (lifetime and current): denies  Review of Systems  Constitutional:  Positive for fatigue.  HENT: Negative.    Eyes: Negative.   Respiratory: Negative.  Cardiovascular: Negative.   Gastrointestinal:  Positive for nausea.  Genitourinary: Negative.   Musculoskeletal:  Positive for myalgias.  Skin: Negative.   Neurological:  Positive for tremors and difficulty with concentration.  Endo/Heme/Allergies: Negative.   Psychiatric/Behavioral:  Positive for depression. The patient has insomnia.      Psychiatric and Social History  Psychiatric History:  Information collected from patient  Prev Dx/Sx: depression anxiety, although describes history of substance induced mood  disorder and states when she was sober for a year in 23-24' she did not have mood symptoms Current Psych Provider: denies Home Meds (current): denies Previous Med Trials: Zoloft (headaches), Wellbutrin ("kinda" helpful) Therapy: does not see a therapist  Prior Psych Hospitalization: denies  Prior Self Harm: denies Prior Violence: denies  Family Psych History: denies Family Hx suicide: denies  Social History:  Patient lives alone and states her mother is her biggest support. 4 DUIs. Access to weapons/lethal means: denies   Substance History As per HPI patient drinking a pint of liquor daily. Reports history of medical hospitalizations for complicated withdrawals, possible Dts. Denies history of seizures. Reports cannabis use a "few times a week." Denies any other substance abuse  Exam Findings  Physical Exam:   Physical exam: Please see exam on admit note. General: Well developed, well nourished.  Pupils: Normal at 3mm Respiratory: Breathing is unlabored.  Cardiovascular: No edema.  Language: No anomia, no aphasia Muscle strength and tone-pt moving all extremities.  Gait not assessed as pt remained in bed.  Neuro: Facial muscles are symmetric. Pt without tremor, no evidence of hyperarousal.  Vital Signs:  Temp:  [98.2 F (36.8 C)-98.9 F (37.2 C)] 98.9 F (37.2 C) (04/05 1422) Pulse Rate:  [80-118] 80 (04/05 1422) Resp:  [0-20] 18 (04/05 1422) BP: (118-138)/(81-103) 124/89 (04/05 1422) SpO2:  [93 %-98 %] 96 % (04/05 1422) Weight:  [73.3 kg] 73.3 kg (04/05 0500) Blood pressure 124/89, pulse 80, temperature 98.9 F (37.2 C), temperature source Oral, resp. rate 18, height 5\' 2"  (1.575 m), weight 73.3 kg, last menstrual period 12/06/2023, SpO2 96%. Body mass index is 29.56 kg/m.    Mental Status Exam: General Appearance: Casual  Orientation:  Full (Time, Place, and Person)  Memory:  Immediate;   Fair  Concentration:  Concentration: Fair  Recall:  Fair  Attention  Fair   Eye Contact:  Fair  Speech:  Normal Rate  Language:  Fair  Volume:  Normal  Mood: "tired"  Affect:  Constricted  Thought Process:  Goal Directed  Thought Content:  WDL  Suicidal Thoughts:  No  Homicidal Thoughts:  No  Judgement:  Fair  Insight:  Fair  Psychomotor Activity:  Normal  Akathisia:  No  Fund of Knowledge:  Fair      Assets:  Desire for Improvement  Cognition:  WNL  ADL's:  Intact  AIMS (if indicated):        Other History   These have been pulled in through the EMR, reviewed, and updated if appropriate.  Family History:  The patient's family history includes COPD in her paternal grandmother; Hypertension in her father; Nephrolithiasis in her brother and father.  Medical History: Past Medical History:  Diagnosis Date   Abnormal liver function test 03/18/2013   Alcohol use 06/03/2015   Allergy    Asthma    Chicken pox as a child   Depression with anxiety    Essential hypertension 06/03/2015   Insomnia 03/17/2013   Other and unspecified hyperlipidemia 03/18/2013    Surgical  History: Past Surgical History:  Procedure Laterality Date   WISDOM TOOTH EXTRACTION  19 yrs     Medications:   Current Facility-Administered Medications:    0.9 %  sodium chloride infusion, , Intravenous, Continuous, Irena Cords V, MD, Last Rate: 75 mL/hr at 12/07/23 0850, New Bag at 12/07/23 0850   acetaminophen (TYLENOL) tablet 650 mg, 650 mg, Oral, Q6H PRN **OR** acetaminophen (TYLENOL) suppository 650 mg, 650 mg, Rectal, Q6H PRN, Gertha Calkin, MD   chlordiazePOXIDE (LIBRIUM) capsule 10 mg, 10 mg, Oral, QID, Irena Cords V, MD, 10 mg at 12/07/23 1308   folic acid (FOLVITE) tablet 1 mg, 1 mg, Oral, Daily, Terrilee Files, MD, 1 mg at 12/07/23 0847   hydrALAZINE (APRESOLINE) injection 5 mg, 5 mg, Intravenous, Q6H PRN, Gertha Calkin, MD   LORazepam (ATIVAN) tablet 1-4 mg, 1-4 mg, Oral, Q1H PRN **OR** LORazepam (ATIVAN) injection 1-4 mg, 1-4 mg, Intravenous, Q1H PRN, Terrilee Files, MD, 1 mg at 12/07/23 0211   morphine (PF) 2 MG/ML injection 2 mg, 2 mg, Intravenous, Q4H PRN, Gertha Calkin, MD   multivitamin with minerals tablet 1 tablet, 1 tablet, Oral, Daily, Terrilee Files, MD, 1 tablet at 12/07/23 0847   pantoprazole (PROTONIX) injection 40 mg, 40 mg, Intravenous, Q12H, Irena Cords V, MD, 40 mg at 12/07/23 0847   potassium chloride (KLOR-CON) packet 40 mEq, 40 mEq, Oral, BID, Toni Amend W, DO, 40 mEq at 12/07/23 1409   sodium chloride flush (NS) 0.9 % injection 3 mL, 3 mL, Intravenous, Q12H, Irena Cords V, MD, 3 mL at 12/07/23 0848   thiamine (VITAMIN B1) tablet 100 mg, 100 mg, Oral, Daily, 100 mg at 12/07/23 0847 **OR** thiamine (VITAMIN B1) injection 100 mg, 100 mg, Intravenous, Daily, Terrilee Files, MD  Allergies: Allergies  Allergen Reactions   Aleve [Naproxen Sodium] Other (See Comments)    wheeze    Miguel Rota, MD

## 2023-12-07 NOTE — Progress Notes (Signed)
  Progress Note   Patient: Cynthia Salazar UJW:[id number] DOB: [birth year] DOA: 12/06/2023     0 DOS: the patient was seen and examined on 12/07/2023   Brief hospital course: 33 y.o. female with past medical history  of alcohol abuse alcohol withdrawal depression essential hypertension obesity, cirrhosis and liver imaging.  Has been drinking since the age of 87 currently drinks about a pint of vodka per day has had 4 DWIs.  Currently under alcohol withdrawal CIWA protocol   Assessment and Plan:   Acute Alcohol withdrawal -tremulous and withdrawing on arrival to the emergency room.  Patient typically drinks a pint of vodka daily.  Currently on CIWA with scheduled Librium for the first 48 hours.  Vitamin supplementation on board.  Substance abuse counseling and psych referral requested.  Alcoholic hepatitis with cirrhosis  -Elevated LFTs AST>ALT.  Maddrey score showing low risk, will hold off on prednisolone therapy.  Cirrhosis suggested from CT 10/21/2023.  Noted severe thrombocytopenia and mild coagulopathy.  Will encourage weaning/cessation.  Marked thrombocytopenia - Platelets 25.  Likely secondary to cirrhosis.  No active bleeding appreciated.  Will continue to monitor, transfuse if less than 10.  Possible pancreatitis, alcoholic, acute -Minimally elevated lipase with known alcohol abuse.  No new imaging to suggest acute pancreatitis.  Patient with no complaints of abdominal pain at this time.  Will continue to monitor closely.  Likely resolved/ruled out.  Hypokalemia with hypomagnesemia -Replenishment initiated.  Will recheck BMP and mag in AM.  Depression with anxiety -May resume patient's home regiment, however patient's compliance is questionable.  As of care - CIWA protocol as above.  Unclear if patient has desire to quit drinking.  However currently she seems very lethargic with noted LFTs and thrombocytopenia.      Subjective: Patient resting on her side this morning.  Bit  lethargic but answering questions appropriately.  Reporting that she is very thirsty.  Denies any fever, shortness of breath, nausea, vomiting, abdominal pain.  Physical Exam: Vitals:   12/07/23 0157 12/07/23 0500 12/07/23 0508 12/07/23 0732  BP: (!) 138/103  (!) 138/100 (!) 133/99  Pulse: (!) 110  (!) 102 (!) 106  Resp:   19 18  Temp:   98.4 F (36.9 C) 98.2 F (36.8 C)  TempSrc:   Oral Oral  SpO2:   93% 98%  Weight:  73.3 kg    Height:       GENERAL:  Alert but lethargic, pleasant, disheveled HEENT:  EOMI CARDIOVASCULAR:  RRR, no murmurs appreciated RESPIRATORY:  Clear to auscultation, no wheezing, rales, or rhonchi GASTROINTESTINAL:  Soft, nontender, nondistended EXTREMITIES:  No LE edema bilaterally NEURO:  No new focal deficits appreciated SKIN:  No rashes noted PSYCH:  Appropriate mood and affect   Data Reviewed:  There are no new results to review at this time.  Family Communication: None at bedside  Disposition: Status is: Observation The patient will require care spanning > 2 midnights and should be moved to inpatient because: Ongoing alcohol withdrawal with concurrent tachycardia and thrombocytopenia  Planned Discharge Destination: Home    Time spent: 34 minutes  Author: Deanna Artis, DO 12/07/2023 1:37 PM  For on call review [email or web address].

## 2023-12-08 DIAGNOSIS — F10139 Alcohol abuse with withdrawal, unspecified: Secondary | ICD-10-CM | POA: Diagnosis not present

## 2023-12-08 LAB — COMPREHENSIVE METABOLIC PANEL WITH GFR
ALT: 81 U/L — ABNORMAL HIGH (ref 0–44)
AST: 179 U/L — ABNORMAL HIGH (ref 15–41)
Albumin: 3.2 g/dL — ABNORMAL LOW (ref 3.5–5.0)
Alkaline Phosphatase: 56 U/L (ref 38–126)
Anion gap: 12 (ref 5–15)
BUN: <5 mg/dL — ABNORMAL LOW (ref 6–20)
CO2: 23 mmol/L (ref 22–32)
Calcium: 7.8 mg/dL — ABNORMAL LOW (ref 8.9–10.3)
Chloride: 98 mmol/L (ref 98–111)
Creatinine, Ser: 0.72 mg/dL (ref 0.44–1.00)
GFR, Estimated: >60 mL/min (ref >60)
Glucose, Bld: 84 mg/dL (ref 70–99)
Potassium: 3.2 mmol/L — ABNORMAL LOW (ref 3.5–5.1)
Sodium: 133 mmol/L — ABNORMAL LOW (ref 135–145)
Total Bilirubin: 1.5 mg/dL — ABNORMAL HIGH (ref 0.0–1.2)
Total Protein: 5.9 g/dL — ABNORMAL LOW (ref 6.5–8.1)

## 2023-12-08 LAB — MAGNESIUM: Magnesium: 1.7 mg/dL (ref 1.7–2.4)

## 2023-12-08 LAB — CBC
HCT: 37 % (ref 36.0–46.0)
Hemoglobin: 13 g/dL (ref 12.0–15.0)
MCH: 30.4 pg (ref 26.0–34.0)
MCHC: 35.1 g/dL (ref 30.0–36.0)
MCV: 86.7 fL (ref 80.0–100.0)
Platelets: 31 10*3/uL — ABNORMAL LOW (ref 150–400)
RBC: 4.27 MIL/uL (ref 3.87–5.11)
RDW: 13.5 % (ref 11.5–15.5)
WBC: 2.9 10*3/uL — ABNORMAL LOW (ref 4.0–10.5)
nRBC: 0 % (ref 0.0–0.2)

## 2023-12-08 LAB — PROTIME-INR
INR: 1.1 (ref 0.8–1.2)
Prothrombin Time: 14.4 s (ref 11.4–15.2)

## 2023-12-08 MED ORDER — POTASSIUM CHLORIDE 20 MEQ PO PACK
40.0000 meq | PACK | Freq: Two times a day (BID) | ORAL | Status: AC
  Administered 2023-12-08 (×2): 40 meq via ORAL
  Filled 2023-12-08 (×2): qty 2

## 2023-12-08 MED ORDER — GABAPENTIN 100 MG PO CAPS
200.0000 mg | ORAL_CAPSULE | Freq: Three times a day (TID) | ORAL | Status: DC
  Administered 2023-12-08 – 2023-12-09 (×2): 200 mg via ORAL
  Filled 2023-12-08 (×2): qty 2

## 2023-12-08 NOTE — Progress Notes (Signed)
  Progress Note   Patient: Cynthia Salazar ION:[id number] DOB: Feb 06, 1991 DOA: 12/06/2023     1 DOS: the patient was seen and examined on 12/08/2023   Brief hospital course: 33 y.o. female with past medical history  of alcohol abuse alcohol withdrawal depression essential hypertension obesity, cirrhosis and liver imaging.  Has been drinking since the age of 16 currently drinks about a pint of vodka per day has had 4 DWIs.  Currently under alcohol withdrawal CIWA protocol   Assessment and Plan:   Acute Alcohol dependence with withdrawal -tremulous and withdrawing on arrival to the emergency room.  Patient typically drinks a pint of vodka daily.  Currently on CIWA, transition to Ativan.  Vitamin supplementation on board.  Substance abuse counseling.  Psychiatry evaluated and following closely.  Initiated acamprosate TID for alcohol cessation.  Recommending inpatient rehab for patient's alcohol use disorder given her multiple admissions and DUIs.  Alcoholic hepatitis with cirrhosis  -Elevated LFTs AST>ALT.  Maddrey score showing low risk, will hold off on prednisolone therapy.  Cirrhosis suggested from CT 10/21/2023.  Noted severe thrombocytopenia and mild coagulopathy.  Will encourage weaning/cessation.  Marked thrombocytopenia - Likely secondary to cirrhosis.  No active bleeding appreciated.  Will continue to monitor, transfuse if less than 10.  Possible pancreatitis, alcoholic, acute -Minimally elevated lipase with known alcohol abuse.  No new imaging to suggest acute pancreatitis.  Patient with no complaints of abdominal pain at this time.  Will continue to monitor closely.  Likely resolved/ruled out.  Hypokalemia with hypomagnesemia -Replenishment initiated.  Will recheck BMP and mag in AM.  Depression with anxiety -May resume patient's home regiment, however patient's compliance is questionable.  Goals of care - CIWA protocol as above.  Patient has desire to quit per discussion this  morning.  On board with psychiatry's rec patient for inpatient rehab.  Per case management, patient will have to facilitate this after discharge from the hospital.  Can provide counseling information.  Anticipate discharge 1-3 days.     Subjective: Patient resting on her side this morning.  Much more alert, answering questions appropriately.  Admits she does want help with her alcohol dependence.  Agrees with psychiatric evaluation yesterday.  Denies any fever, shortness of breath, nausea, vomiting, abdominal pain.  Physical Exam: Vitals:   12/07/23 1422 12/07/23 1940 12/07/23 2119 12/08/23 0414  BP: 124/89 (!) 127/91 119/85 (!) 129/96  Pulse: 80 97 85 81  Resp: 18 18  18   Temp: 98.9 F (37.2 C) 98.3 F (36.8 C)  99.5 F (37.5 C)  TempSrc: Oral Oral  Oral  SpO2: 96% 96%  95%  Weight:      Height:       GENERAL:  Alert but lethargic, pleasant, disheveled HEENT:  EOMI CARDIOVASCULAR:  RRR, no murmurs appreciated RESPIRATORY:  Clear to auscultation, no wheezing, rales, or rhonchi GASTROINTESTINAL:  Soft, nontender, nondistended EXTREMITIES:  No LE edema bilaterally NEURO:  No new focal deficits appreciated SKIN:  No rashes noted PSYCH: Depressed  Data Reviewed:  There are no new results to review at this time.  Family Communication: None at bedside  Disposition: Status is: Observation The patient will require care spanning > 2 midnights and should be moved to inpatient because: Ongoing alcohol withdrawal with concurrent tachycardia and thrombocytopenia  Planned Discharge Destination: Home    Time spent: 35 minutes  Author: Deanna Artis, DO 12/08/2023 11:06 AM  For on call review [email or web address].

## 2023-12-08 NOTE — Plan of Care (Signed)
Problem: Health Behavior/Discharge Planning: Goal: Ability to manage health-related needs will improve Outcome: Progressing   Problem: Activity: Goal: Risk for activity intolerance will decrease Outcome: Progressing   Problem: Coping: Goal: Level of anxiety will decrease Outcome: Progressing   Problem: Safety: Goal: Ability to remain free from injury will improve Outcome: Progressing   Problem: Skin Integrity: Goal: Risk for impaired skin integrity will decrease Outcome: Progressing   

## 2023-12-08 NOTE — Plan of Care (Signed)
 ?  Problem: Clinical Measurements: ?Goal: Ability to maintain clinical measurements within normal limits will improve ?Outcome: Progressing ?Goal: Will remain free from infection ?Outcome: Progressing ?Goal: Diagnostic test results will improve ?Outcome: Progressing ?  ?

## 2023-12-08 NOTE — Discharge Instructions (Signed)
Outpatient Programs   Narcotics Anonymous 24-HOUR HELPLINE Pre-recorded for Meeting Schedules [facility] [phone]  [email or web address] ALCOHOLICS ANONYMOUS  [facility]  Answering Service [phone] Please Note: All [place] Meetings are Non-smoking [email or web address].cy     aanorthcarolina.Electrical engineer agencies for assistance with funding  [facility] for [facility] [address] [place], [place], [place] [address] Phone: [phone]  [employer] [phone]- crisis line  [facility] (statewide facilities/programs) [address] (Medicaid/state funds) [place], [place] [address] [phone] Cynthia Salazar- [phone] [place]- [phone] Cynthia [phone]! [email or web address]  [facility] Pharmacist, community (Medicaid/state funds) Crisis line [phone] [facility] [phone] [place] [phone] [address] [phone]   [facility] [place] (2 Locations) (Medicaid/state funds) --[address]  walk in 8:30-12 and 1-2:30 [place], [id number]   [facility]- [phone] --[address] [place], [place] [address]  [phone] walk in 8:30-12 and 2-3:30                         [facility] Substance Use [facility] Mental [facility] Crisis [facility]-alternative to emergency rooms, jails or the streets. OPEN [address] HOURS EVERY DAY [address] [place], [place] [address]  [phone]  [place]- [address] [place], [place] [address]  [phone] (24 hours)  [place]- [address] [place] [phone]  [place]- [address] [place] [phone]                     7 day detox [phone] and [facility] [address] [place], [place] [address] [phone] (24 hours)  [place]- [address] [place], [place] [address] [phone]  [facility]- [address] [place], [place] [address] [phone] (24 hours)  [place] [address] [place], [place]  [address]  [phone]  Turner Daniels- [address] [place] [phone]  [place]- [address] [place] Ace [phone] Medicaid and state funds  [facility] -  Insurance: Medicaid /State funding/private insurance Methadone, suboxone  [place] [phone] Fax: [phone] [address], [place], [place], [address] [facility] [email or web address] Types of Program: Transitional housing, IOP, Outpatient treatment, [employer] funding/Medicaid Phone: [phone] Fax: [phone] Address: [address] [place] [address]  [facility], [place], most private insurance providers [address] [place], [place] [address] [phone] Cynthia Salazar Addiction and internal medicine clinic Private insurance/Medicare/private pay Outpatient/suboxone/general medicine [address] [place], [place] [address]   [phone]  (Suboxone)  [facility] (MH/SA) Medicaid  [address]  [place], [place] [address] [phone]   Alcohol/drug Council of [place] Information and referral for programs for pregnant women [phone] [facility] Text  [phone]  Cynthia Salazar 12pm -6pm  [facility]-SA[facility] [place] [phone] [facility] [address] [place], [place] [address] [phone]- Substance abuse treatment  [facility] Medication management and therapy, IOP/PHP [address] [place], [place] [address] [phone] (Medicaid/state funds- individual -private insurance-group) [place] [facility]- State funds only MH/SA therapy and med management [address] [place], [place] [address] [phone] / [phone] Crisis Center: [phone]  Cynthia Salazar Prevention and Recovery Inpt and outpt- insurance, Medicaid, self pay [address]  [place], [place] [address] [phone] [address] [place], [place] [address] [phone]   M-F 8-5  Residential  Programs  [place] [facility]  Insurance: Medicaid, some private insurance Types of Program: Medical Detox, Residential Treatment  for [facility] residents Phone: [phone] Fax: [phone] [address] [place], [place], [place], [address]  [facility] Margo Aye (SelfDetection.tn) Only private insurance providers or self-pay Types of Program: CDIOP, Extended Treatment, Family Therapy, Group Counseling, Inpatient program, Partial Programming  Phone: [phone] Fax:  [phone] Address: [address], [place], [address]  Dreams Insurance: Accepts those with and without insurance,  [facility] sponsorship  Type of Program: Residential Treatment Phone: [phone]   Address: [address], [place], [place], [address] [facility] (VeganReport.com.au) Insurance: [place] - based on those with income [phone]) and without ($285) Types of Program: 90-Day Program  Phone: [phone] Fax: [phone] Address: [address], [place], [place], [address]   [facility] (http://rivera-kline.com/) Insurance: n/a Types of Program: Residential Treatment Phone: [phone] Fax: [phone] Address: [address], [place], [place], [address]  [facility] (http://www.onlinegreensboro.[email or web address]/[email or web address]) Insurance: n/a Types of Program: Residential Treatment, Substance Abuse Treatment for homeless women  Phone: [phone] Fax: [phone] Address: [address], [place], [address]  [facility] (ages 33-40) (www.teenchallengeusa.com) Insurance: n/a; $700 entrance fee, including $50 non-refundable application fee  Types of Program: Residential Treatment Phone: [phone] Fax: [phone] Address: [address], [place], [place], [address] (Statewide locations) [facility] [place]  -[employer] or [place] [phone]  [employer] (FunnyTan.co.nz)  Insurance:  n/a; fees range from [phone] (rent) Types of Program: Sober living for those in recovery Phone: [phone] [facility]) / [phone] ([place]) / [phone] Cynthia Salazar)    Cynthia Salazar (women) [address] [place], [place] [address] [phone] FAX [phone]  [address] Work based (manual labor) Residential- minimum 2 years [phone] [address]  [place], [place] [address]  [facility] Insurance: n/a 9-12 month recovery- WOMEN and children [address] [address]  [place], [place] [address]  Phone: [phone] [facility]  WOMEN 1 yr -[place] based- no meds [address] [place], [place] [address]  [phone]  [facility]   [facility] Drug Services -  Insurance: Medicaid /State funding/private insurance Methadone, suboxone [address] [place], [place] [address] [phone] Cynthia Salazar-  [address]  [place]  [phone] 7 day detox   [facility] (http://www.BargainMaintenance.cz) State funds, self-pay, some private insurance providers Phone: [phone] Fax: [phone] Address: [address] [place], [place], [place], [address] [employer] n/a; work-based program for chronic relapse patients  Types of Program: Residential Treatment  Phone: [phone] Fax: [phone] Address: [address], [place], [place],  [address]  [facility] (www.thefellowshiphome.org)  Insurance: n/a; $75-100/week Types of Program: Individual/Group Therapy, 2-year Relapse Prevention program  Phone: [phone] Fax:  [phone] Address: [address] [place], [place], [address] [facility]  Insurance: n/a Types of Program: Residential Treatment, Housing program, Work Therapy Phone: [phone] Fax: [phone] Address:  [address] [place], [place], [address]  [employer]: n/a Types of Programs: Residential Treatment Phone: [phone]    Address: [address], [place], [place], [address]  [employer] - 6-12 months Cardinal/Medicaid/medicare/disability [address] (main office) [place], [place] [address] [phone]  [facility]   [facility] [place] (http://www.http://www.ball-ray.[email or web address]/) Insurance: n/a; clients must pay a cash fee of $3,220  upfront Types of Program: Residential Treatment Phone: [phone] Fax: [phone] Address: [address], [place], [place], [address]  [facility] Insurance: n/a; must be able to work on site Types of Program: Residential Treatment Phone: [phone] Cynthia Salazar [place]) / [phone] Cynthia Salazar) Address: [address], [place], [place], [address]  [facility]   [facility] - [employer]- women (http://charlotterescuemission.org/) Insurance: n/a  Types of Programs: Residential Treatment Phone: [phone] Fax: [phone] Address: [address], [place], [place], [address] [facility] Insurance: Medicaid, all private insurance providers not in a network  Types of Program: Drug Education, DUI Treatment, Methadone program, Residential Treatment, Substance Abuse Intensive Outpatient Phone: [phone] Fax: [phone] Address: [address], [place], [place], [address]  [facility] - [employer] - Men Insurance: n/a  Types of Program: Residential Treatment Phone: [phone] Fax: [phone] Address: [address], [place], [place], [address] [facility].  Insurance: n/a; the fee for the first 90 days will be waived upon completion of the entire program. Types of Program: Men and Women's, Families Level,     [facility] for the homeless  Phone: [phone] Fax: [phone] Address: [address] [place] [address] [place], [place], [address]  [facility] [place]   [facility] (ARanked.fi)  Insurance: n/a; [phone] for 30 days, afterwards clients are expected to work onsite to maintain stay  Types of Program: Aftercare services, Day Program,   (partially funded)  Phone: [phone] Fax: [phone] Address: [address] - [address] - [place], [place]  [address]  [facility] (http://www.flynnhickory.org/) Insurance: n/a; $250.00/first two weeks and $125/week afterwards Types of Program: Housing for clients who do not suffer from psychological or medical problems Phone: [phone] Fax: [phone] Address: [address] [place], [place], [address]  [facility] (DrawPay.co.nz) Insurance: n/a; $150 entry fee  Types of Program: Therapeutic Community   Phone: [phone] Fax: [phone]  Address: [address], [place], [place], [address] [place] [employer]: n/a Types of Program: Residential Treatment Phone: [phone] Fax: [phone] Address: [address], [place], [place], [address]  [facility]  Insurance: $300 entry fee-(some scholarships available) Types of Program: Residential Treatment  Phone: [phone] Fax: [phone] Address: [address], [place], [place], [address] [facility]) Insurance: n/a; sliding scale between $6,000-$12,000 depending on income Types of Program: Residential Treatment Phone: [phone] Fax: [phone] Address: [address] 2nd floor Billie Lade [place], [place], [address]  [facility] [place] (women 18-39 yo) 1 year Minimum $400 entrance fee [address] [place], [place] [address] [phone] Fax [phone] [facility] [address] [place] [address] Phone: [phone] Fax [phone] $250 entry fee  [facility] insurance only [address] [place], [place] [address] Phone: [phone]   [place]   [facility] (RainTreatment.es) Insurance: n/a  Types of Program: Emergency overnight stay, Medical Detox, Recovery program for [facility] residents, 82-month Residential Treatment  Phone: [phone] Fax: [phone] Address: [address] [place],  [place], [address] [facility] (http://www.church.org/) Insurance: n/a Types of Program: Residential Treatment  Phone: [phone] Fax: [phone] Address: [address], [place], [address]   [facility], [place], [place] is accepted but will only pay for certain services Types of Program: Inpatient and Outpatient Treatment Phone: [phone] Fax: [phone]  Address: [address], [place], [place], [address] Other Locations: [facility] [address],  [place] C[place], [place], [address] [facility] [address], [place], [place], [address] [facility] (Men Only)  Insurance: n/a; $91/week Types of Program: Housing program for men who are employed full time (up to 6 months) Phone: [phone] Fax: [phone] Address: [address] [place], [place], [address]    [facility] (Men Only) -   program is free for 30 days, afterwards residents may stay on site but are required to have a job or be able to pay $55.00/week. Types of Program: Meals, Shelter, Civil Service fast streamer, Pregnancy services Phone: [phone] Fax: [phone] Address: [address] [place],  [address]  [facility] ([facility]) Detox/crisis, residential, [address], [place], [place] [address] Phone: [phone] State funding and financial assistance   [facility] women- 6 mos $500 entry fee [address] [place], [place] [address] [phone] [facility] MH/SA opt and residential [address], [place], [place] [address] Cynthia Salazar [place], [place] [address] [phone]  Other/out of state   [facility] Treatment Insurance: self pay or cardinal/partners Types of Program: Residential Treatment Phone: Men's Division [phone]                Women's Division [phone]   Address:  [facility] [address], [place], [place], [address] [employer]  (http://owlsnestrecovery.com/) Insurance: n/a; fees range from [id number] Types of Program: Intensive Substance Abuse Rehabilitation Phone: [phone] Fax: [phone]  Address: [address], [place], [place], [address]   [facility] (www.bridgetorecovery.org) $2500.00 for 30 day program Types of Program: Transitional Housing Phone: [phone] Fax: [phone] Address: [address], [place], [place], [address]   [employer]  Insurance: n/a Types of Program: Residential Treatment (does not allow behavioral health medications) Phone: [phone] Fax: [phone]  Address: [address], [place], [address]  [facility] (http://galax.LogTrades.[email or web address])  Insurance: Public affairs consultant, private pay [phone] for 28 days) Types of Program: Chronic Pain Recovery, DUI Treatment, Family Therapy, Gender-Specific Therapy, Opiate Recovery, Residential Treatment Phone: [phone] Fax: [phone] Address: [address] [place], [place], [address] [facility] ([facility]  [facility])  Medicaid, Medicare and private insurance  providers; cost of $517.00/day  Types of Program: 7-day Detoxification program and 14-day Rehabilitation program  Phone: [phone]: [phone] Address: [address] [place], [place], [address]  [facility] [employer] locations [employer] only [place] [phone]

## 2023-12-09 DIAGNOSIS — F10139 Alcohol abuse with withdrawal, unspecified: Secondary | ICD-10-CM | POA: Diagnosis not present

## 2023-12-09 LAB — COMPREHENSIVE METABOLIC PANEL WITH GFR
ALT: 84 U/L — ABNORMAL HIGH (ref 0–44)
AST: 164 U/L — ABNORMAL HIGH (ref 15–41)
Albumin: 3.3 g/dL — ABNORMAL LOW (ref 3.5–5.0)
Alkaline Phosphatase: 61 U/L (ref 38–126)
Anion gap: 13 (ref 5–15)
BUN: <5 mg/dL — ABNORMAL LOW (ref 6–20)
CO2: 22 mmol/L (ref 22–32)
Calcium: 8.6 mg/dL — ABNORMAL LOW (ref 8.9–10.3)
Chloride: 101 mmol/L (ref 98–111)
Creatinine, Ser: 0.81 mg/dL (ref 0.44–1.00)
GFR, Estimated: >60 mL/min (ref >60)
Glucose, Bld: 79 mg/dL (ref 70–99)
Potassium: 3.9 mmol/L (ref 3.5–5.1)
Sodium: 136 mmol/L (ref 135–145)
Total Bilirubin: 1.2 mg/dL (ref 0.0–1.2)
Total Protein: 6.4 g/dL — ABNORMAL LOW (ref 6.5–8.1)

## 2023-12-09 LAB — CBC
HCT: 39.1 % (ref 36.0–46.0)
Hemoglobin: 13.7 g/dL (ref 12.0–15.0)
MCH: 30.7 pg (ref 26.0–34.0)
MCHC: 35 g/dL (ref 30.0–36.0)
MCV: 87.7 fL (ref 80.0–100.0)
Platelets: 41 10*3/uL — ABNORMAL LOW (ref 150–400)
RBC: 4.46 MIL/uL (ref 3.87–5.11)
RDW: 13.9 % (ref 11.5–15.5)
WBC: 2.9 10*3/uL — ABNORMAL LOW (ref 4.0–10.5)
nRBC: 0 % (ref 0.0–0.2)

## 2023-12-09 LAB — MAGNESIUM: Magnesium: 1.6 mg/dL — ABNORMAL LOW (ref 1.7–2.4)

## 2023-12-09 MED ORDER — ACAMPROSATE CALCIUM 333 MG PO TBEC
666.0000 mg | DELAYED_RELEASE_TABLET | Freq: Three times a day (TID) | ORAL | 2 refills | Status: AC
Start: 2023-12-09 — End: ?

## 2023-12-09 MED ORDER — ACAMPROSATE CALCIUM 333 MG PO TBEC: 666.0000 mg | DELAYED_RELEASE_TABLET | Freq: Three times a day (TID) | ORAL | 1 refills | Status: DC

## 2023-12-09 MED ORDER — GABAPENTIN 100 MG PO CAPS: 200.0000 mg | ORAL_CAPSULE | Freq: Three times a day (TID) | ORAL | 1 refills | Status: DC

## 2023-12-09 MED ORDER — GABAPENTIN 100 MG PO CAPS: 200.0000 mg | ORAL_CAPSULE | Freq: Three times a day (TID) | ORAL | 2 refills | Status: AC

## 2023-12-09 NOTE — Consult Note (Signed)
 [facility] Consult Follow-up  Patient Name: .Cynthia Salazar  MRN: [id number]  DOB: [birth year]  Consult Order details:  Orders (From admission, onward)     Start     Ordered   12/06/23 2146  IP CONSULT TO PSYCHIATRY       Ordering Provider: Gertha Calkin, MD  Provider:  (Not yet assigned)  Question Answer Comment  Location [facility]   Reason for Consult? Alcohol abuse / depression.      12/06/23 2145             Mode of Visit: In person    Psychiatry Consult Evaluation  Service Date: December 09, 2023 LOS:  LOS: 2 days  Chief Complaint "I feel tired"  Primary Psychiatric Diagnoses  Alcohol use disorder severe with withdrawal 2. Substance induced mood disorer.  Assessment  Cynthia Salazar is a 33 y.o. female admitted: Medicallyfor 12/06/2023 11:09 AM for alcohol withdrawal. She carries the psychiatric diagnoses of alcohol use disorder, depression, anxiety and has a past medical history of  essential hypertension obesity, cirrhosis and liver imaging.    4/4: Her current presentation of alcohol withdrawal is most consistent with alcohol use disorer severe with withdrawal and substance induced mood disorder. Patient states she previously took Zoloft but stopped taking it 2 months ago due to headaches. Patient states when she was sober for a year her mood was good and she was not suffering from symptoms of depression or anxiety. Given her severe thrombocytopenia less than 30 today I think the risk of SSRI outweighs benefit especially given that her mood symptoms are substance induced per her history.  Discussed r/b/a of trial of acamprosate for alcohol cessation and patient expressed interest in starting thus will start. Naltrexone is inappropriate due to liver cirrhosis. Please see plan below for detailed recommendations.    4/6: patient tolerating acamprosate and hydroxyzine without side effects. Has tremor today on exam, discussed starting gabapentin  for anxiety as well as to assist with her detox and she was agreeable. States she now wants to do residential rehab and we discussed her going to [facility] when she is medically stable. Denies SI or HI, denies AVH. Denies prior suicide attempts. Future oriented on sobriety. Although only superficially interested at this time. Does not meet IVC criteria. Low acute risk for suicide.   4/7: Patient discharging to [facility] today.  Plan to continue acamprosate 666 mg 3 times daily gabapentin 200 mg 3 times daily. CIWAs have maintained < 5 over 72 hours. Appears goal oriented and motivated to maintain sobriety. Has significant social support in the form of family and friends.   Diagnoses:  Active Hospital problems: Principal Problem:   Alcohol withdrawal (HCC) Active Problems:   Depression with anxiety   Mild intermittent asthma   Elevated LFTs   Obesity (BMI 30.0-34.9)   Hypomagnesemia   Pancreatitis, alcoholic, acute   Cirrhosis, alcoholic (HCC)   Alcohol abuse with withdrawal (HCC)    Plan   ## Psychiatric Medication Recommendations:  - Continue on gabapentin 200 mg TID for anxiety and alcohol withdrawal - Continue acamprosate 666 mg TID for alcohol cessation - don't recommend SSRI due to antiplatelet effect and increased risk for bleeding with platelets under 30k as well as patient denying mood symptoms when she was sober   ## Medical Decision Making Capacity:  patient has decision making capacity    ## Disposition:---States she now wants to do residential rehab and we discussed her going to [facility] when  she is medically stable.  ## Behavioral / Environmental: - No specific recommendations at this time.     ## Safety and Observation Level:  - Based on my clinical evaluation, I estimate the patient to be at low risk of self harm in the current setting. - At this time, we recommend  routine. This decision is based on my review of the chart including patient's history and current  presentation, interview of the patient, mental status examination, and consideration of suicide risk including evaluating suicidal ideation, plan, intent, suicidal or self-harm behaviors, risk factors, and protective factors. This judgment is based on our ability to directly address suicide risk, implement suicide prevention strategies, and develop a safety plan while the patient is in the clinical setting. Please contact our team if there is a concern that risk level has changed.  CSSR Risk Category:C-SSRS RISK CATEGORY: Low Risk  Suicide Risk Assessment: Patient has following modifiable risk factors for suicide: recklessness, which we are addressing by recommending inpatient rehab. Patient has following non-modifiable or demographic risk factors for suicide: none Patient has the following protective factors against suicide: Access to outpatient mental health care, Supportive family, no history of suicide attempts, and no history of NSSIB  Thank you for this consult request. Recommendations have been communicated to the primary team.  We will sign off, pleas reconsult when patient medically stable and has completed detox for [facility] placement   Lorri Frederick, MD       History of Present Illness  Cynthia Salazar is a 33 y.o. female admitted: Medicallyfor 12/06/2023 11:09 AM for alcohol withdrawal. She carries the psychiatric diagnoses of alcohol use disorder, depression, anxiety and has a past medical history of  essential hypertension obesity, cirrhosis and liver imaging.  Patient admitted for severe thrombocytopenia with platelets 25 likely secondary to liver cirrhosis as well as alcohol         hepatitis and possible  pancreatitis.   Patient Report:  Patient was evaluated bedside.  Reports feeling ready to discharge today.  She denies any symptoms of withdrawals aside from some excess swelling.  She reports medication adherence and tolerance to her psychotropic medications.  She reports  adequate appetite she reports no excess sedation from gabapentin, denies any side effects SI, HI, and AVH.  She reports her cravings have been under control since admission and is looking forward to inpatient treatment at [facility].  Psych Review of Symptoms:  ADHD: Patient denied any symptoms.     Anxiety:  Generalized Anxiety Symptoms: Difficulty controlling worry, difficulty with concentration and sleep disturbances due to anxiety.  Additional Anxiety Symptoms: No panic attacks.     Depressive Symptoms:  Depressed mood, decreased interest, fatigue, hopelessness, insomnia and low self esteem. No psychomotor retardation and no suicidal ideation.      Psych ROS:  Depression: as above Anxiety:  reports anxiety when withdrawing from alcohol but not in the year during which she was sober Mania (lifetime and current): denies Psychosis: (lifetime and current): denies  Review of Systems  Constitutional:  Positive for fatigue.  HENT: Negative.    Eyes: Negative.   Respiratory: Negative.    Cardiovascular: Negative.   Gastrointestinal:  Positive for nausea.  Genitourinary: Negative.   Musculoskeletal:  Positive for myalgias.  Skin: Negative.   Neurological:  Positive for tremors and difficulty with concentration.  Endo/Heme/Allergies: Negative.   Psychiatric/Behavioral:  Positive for depression. The patient has insomnia.      Psychiatric and Social History  Psychiatric History:  Information  collected from patient  Prev Dx/Sx: depression anxiety, although describes history of substance induced mood disorder and states when she was sober for a year in 23-24' she did not have mood symptoms Current Psych Provider: denies Home Meds (current): denies Previous Med Trials: Zoloft (headaches), Wellbutrin ("kinda" helpful) Therapy: does not see a therapist  Prior Psych Hospitalization: denies  Prior Self Harm: denies Prior Violence: denies  Family Psych History: denies Family  Hx suicide: denies  Social History:  Patient lives alone and states her mother is her biggest support. 4 DUIs. Access to weapons/lethal means: denies   Substance History As per HPI patient drinking a pint of liquor daily. Reports history of medical hospitalizations for complicated withdrawals, possible Dts. Denies history of seizures. Reports cannabis use a "few times a week." Denies any other substance abuse  Exam Findings  Physical Exam:   Physical exam: Please see exam on admit note. General: Well developed, well nourished.  Pupils: Normal at 3mm Respiratory: Breathing is unlabored.  Cardiovascular: No edema.  Language: No anomia, no aphasia Muscle strength and tone-pt moving all extremities.  Gait not assessed as pt remained in bed.  Neuro: Facial muscles are symmetric. Pt without tremor, no evidence of hyperarousal.  Vital Signs:  Temp:  [98.1 F (36.7 C)-99 F (37.2 C)] 98.9 F (37.2 C) (04/07 0843) Pulse Rate:  [83-98] 98 (04/07 0843) Resp:  [18] 18 (04/07 0843) BP: (125-137)/(94-100) 125/96 (04/07 0843) SpO2:  [94 %-98 %] 97 % (04/07 0843) Blood pressure (!) 125/96, pulse 98, temperature 98.9 F (37.2 C), temperature source Oral, resp. rate 18, height 5\' 2"  (1.575 m), weight 73.3 kg, last menstrual period 12/06/2023, SpO2 97%. Body mass index is 29.56 kg/m.    Mental Status Exam: General Appearance: Casual  Orientation:  Full (Time, Place, and Person)  Memory:  Immediate;   Fair  Concentration:  Concentration: Fair  Recall:  Fair  Attention  Fair  Eye Contact:  Fair  Speech:  Normal Rate  Language:  Fair  Volume:  Normal  Mood: "tired"  Affect:  Constricted  Thought Process:  Goal Directed  Thought Content:  WDL  Suicidal Thoughts:  No  Homicidal Thoughts:  No  Judgement:  Fair  Insight:  Fair  Psychomotor Activity:  Normal  Akathisia:  No  Fund of Knowledge:  Fair      Assets:  Desire for Improvement  Cognition:  WNL  ADL's:  Intact  AIMS (if  indicated):        Other History   These have been pulled in through the EMR, reviewed, and updated if appropriate.  Family History:  The patient's family history includes COPD in her paternal grandmother; Hypertension in her father; Nephrolithiasis in her brother and father.  Medical History: Past Medical History:  Diagnosis Date   Abnormal liver function test 03/18/2013   Alcohol use 06/03/2015   Allergy    Asthma    Chicken pox as a child   Depression with anxiety    Essential hypertension 06/03/2015   Insomnia 03/17/2013   Other and unspecified hyperlipidemia 03/18/2013    Surgical History: Past Surgical History:  Procedure Laterality Date   WISDOM TOOTH EXTRACTION  19 yrs     Medications:   Current Facility-Administered Medications:    acamprosate (CAMPRAL) tablet 666 mg, 666 mg, Oral, TID WC, Zouev, Dmitri, MD, 666 mg at 12/09/23 0729   acetaminophen (TYLENOL) tablet 650 mg, 650 mg, Oral, Q6H PRN **OR** acetaminophen (TYLENOL) suppository 650 mg, 650 mg, Rectal,  Q6H PRN, Gertha Calkin, MD   folic acid (FOLVITE) tablet 1 mg, 1 mg, Oral, Daily, Terrilee Files, MD, 1 mg at 12/09/23 2956   gabapentin (NEURONTIN) capsule 200 mg, 200 mg, Oral, TID, Zouev, Dmitri, MD, 200 mg at 12/09/23 0729   hydrALAZINE (APRESOLINE) injection 5 mg, 5 mg, Intravenous, Q6H PRN, Gertha Calkin, MD   hydrOXYzine (ATARAX) tablet 50 mg, 50 mg, Oral, QHS PRN, Woodroe Mode, Dmitri, MD, 50 mg at 12/09/23 0238   LORazepam (ATIVAN) tablet 1-4 mg, 1-4 mg, Oral, Q1H PRN **OR** LORazepam (ATIVAN) injection 1-4 mg, 1-4 mg, Intravenous, Q1H PRN, Terrilee Files, MD, 1 mg at 12/07/23 0211   morphine (PF) 2 MG/ML injection 2 mg, 2 mg, Intravenous, Q4H PRN, Gertha Calkin, MD   multivitamin with minerals tablet 1 tablet, 1 tablet, Oral, Daily, Terrilee Files, MD, 1 tablet at 12/09/23 0920   pantoprazole (PROTONIX) injection 40 mg, 40 mg, Intravenous, Q12H, Irena Cords V, MD, 40 mg at 12/09/23 0920   sodium  chloride flush (NS) 0.9 % injection 3 mL, 3 mL, Intravenous, Q12H, Irena Cords V, MD, 3 mL at 12/09/23 2130   thiamine (VITAMIN B1) tablet 100 mg, 100 mg, Oral, Daily, 100 mg at 12/09/23 0920 **OR** thiamine (VITAMIN B1) injection 100 mg, 100 mg, Intravenous, Daily, Terrilee Files, MD  Allergies: Allergies  Allergen Reactions   Aleve [Naproxen Sodium] Other (See Comments)    wheeze    Lorri Frederick, MD

## 2023-12-09 NOTE — Discharge Summary (Signed)
 Physician Discharge Summary   Patient: Cynthia Salazar MRN: [id number] DOB: [birth year]  Admit date:     12/06/2023  Discharge date: 12/09/23  Discharge Physician: Deanna Artis   PCP: Bradd Canary, MD   Recommendations at discharge:   At this time patient will be discharged home with hopeful transition to [facility].  If you experience any symptoms such as fever, vomiting, shortness of breath, chest pain, abdominal pain, or other concerning symptoms, please call your primary care provider or go to the emergency department immediately.  Discharge Diagnoses: Principal Problem:   Alcohol withdrawal (HCC) Active Problems:   Depression with anxiety   Mild intermittent asthma   Elevated LFTs   Obesity (BMI 30.0-34.9)   Hypomagnesemia   Pancreatitis, alcoholic, acute   Cirrhosis, alcoholic (HCC)   Alcohol abuse with withdrawal (HCC)  Resolved Problems:   * No resolved hospital problems. *  Hospital Course: 33 y.o. female with past medical history  of alcohol abuse alcohol withdrawal depression essential hypertension obesity, cirrhosis and liver imaging.  Has been drinking since the age of 69 currently drinks about a pint of vodka per day has had 4 DWIs.  Currently under alcohol withdrawal CIWA protocol   Assessment and Plan: Acute Alcohol dependence with withdrawal -tremulous and withdrawing on arrival to the emergency room.  Patient typically drinks a pint of vodka daily.  Vitamin supplementation on board.  Substance abuse counseling.  Psychiatry evaluated and following closely.  Initiated acamprosate TID for alcohol cessation.  Also initiated gabapentin.  Recommending inpatient rehab for patient's alcohol use disorder given her multiple admissions and DUIs.  Patient to transition to [facility].   Alcoholic hepatitis with cirrhosis  -Elevated LFTs AST>ALT.  Maddrey score showing low risk, will hold off on prednisolone therapy.  Cirrhosis suggested from CT 10/21/2023.  Noted  severe thrombocytopenia and mild coagulopathy.  Will encourage weaning/cessation.   Marked thrombocytopenia - Likely secondary to cirrhosis.  No active bleeding appreciated.  Showed improvement throughout the course of hospital stay.  Possible pancreatitis, alcoholic, acute -Minimally elevated lipase with known alcohol abuse.  No new imaging to suggest acute pancreatitis.  Patient with no complaints of abdominal pain at this time.   resolved/ruled out.   Hypokalemia with hypomagnesemia - Resolved after replenishment   Depression with anxiety - Evaluated by psychology.  Not resuming any previous medications secondary to compliance.   Goals of care - CIWA protocol as above.  Patient has desire to quit per discussion this morning.  On board with psychiatry's rec patient for inpatient rehab.   Can provide counseling information.  Patient agreeable to transition to [facility].      Pain control - [employer] Controlled Substance Reporting System database was reviewed. and patient was instructed, not to drive, operate heavy machinery, perform activities at heights, swimming or participation in water activities or provide baby-sitting services while on Pain, Sleep and Anxiety Medications; until their outpatient Physician has advised to do so again. Also recommended to not to take more than prescribed Pain, Sleep and Anxiety Medications.  Consultants: Psychiatry Procedures performed: None Disposition: Home Diet recommendation:  Discharge Diet Orders (From admission, onward)     Start     Ordered   12/09/23 0000  Diet - low sodium heart healthy        12/09/23 1212           Regular diet DISCHARGE MEDICATION: Allergies as of 12/09/2023       Reactions   Aleve [naproxen  Sodium] Other (See Comments)   wheeze        Medication List     STOP taking these medications    busPIRone 5 MG tablet Commonly known as: BUSPAR   chlordiazePOXIDE 25 MG capsule Commonly known as:  LIBRIUM   metoprolol tartrate 25 MG tablet Commonly known as: LOPRESSOR   pantoprazole 20 MG tablet Commonly known as: PROTONIX   sertraline 25 MG tablet Commonly known as: ZOLOFT       TAKE these medications    acamprosate 333 MG tablet Commonly known as: CAMPRAL Take 2 tablets (666 mg total) by mouth 3 (three) times daily with meals.   albuterol 108 (90 Base) MCG/ACT inhaler Commonly known as: VENTOLIN HFA Inhale 2 puffs into the lungs every 6 (six) hours as needed for wheezing or shortness of breath.   gabapentin 100 MG capsule Commonly known as: NEURONTIN Take 2 capsules (200 mg total) by mouth 3 (three) times daily.   ondansetron 4 MG disintegrating tablet Commonly known as: ZOFRAN-ODT Take 4 mg by mouth every 8 (eight) hours as needed for nausea or vomiting.   Wegovy 1 MG/0.5ML Soaj Generic drug: Semaglutide-Weight Management Inject 1 mg into the skin every Monday.        Discharge Exam: Filed Weights   12/06/23 1131 12/07/23 0500  Weight: 72.6 kg 73.3 kg   GENERAL:  Alert, pleasant, no acute distress, disheveled HEENT:  EOMI CARDIOVASCULAR:  RRR, no murmurs appreciated RESPIRATORY:  Clear to auscultation, no wheezing, rales, or rhonchi GASTROINTESTINAL:  Soft, nontender, nondistended EXTREMITIES:  No LE edema bilaterally NEURO:  No new focal deficits appreciated SKIN:  No rashes noted PSYCH:  Appropriate mood and affect    Condition at discharge: improving  The results of significant diagnostics from this hospitalization (including imaging, microbiology, ancillary and laboratory) are listed below for reference.   Imaging Studies: DG Chest 1 View Result Date: 12/06/2023 CLINICAL DATA:  Altered level of consciousness, alcohol withdrawal EXAM: CHEST  1 VIEW COMPARISON:  07/18/2023 FINDINGS: Single frontal view of the chest demonstrates an unremarkable cardiac silhouette. No acute airspace disease, effusion, or pneumothorax. There are no acute bony  abnormalities. IMPRESSION: 1. No acute intrathoracic process. Electronically Signed   By: Sharlet Salina M.D.   On: 12/06/2023 16:56   CT HEAD WO CONTRAST ( ) Result Date: 12/06/2023 CLINICAL DATA:  Delirium. EXAM: CT HEAD WITHOUT CONTRAST TECHNIQUE: Contiguous axial images were obtained from the base of the skull through the vertex without intravenous contrast. RADIATION DOSE REDUCTION: This exam was performed according to the departmental dose-optimization program which includes automated exposure control, adjustment of the mA and/or kV according to patient size and/or use of iterative reconstruction technique. COMPARISON:  Head CT dated 10/21/2023. FINDINGS: Evaluation of this exam is limited due to motion artifact. Brain: Mild age-related atrophy and chronic microvascular ischemic changes, advanced for the patient's age. There is no acute intracranial hemorrhage. No mass effect or midline shift. No extra-axial fluid collection. Vascular: No hyperdense vessel or unexpected calcification. Skull: Normal. Negative for fracture or focal lesion. Sinuses/Orbits: No acute finding. Other: None IMPRESSION: 1. No acute intracranial pathology. 2. Mild age-related atrophy and chronic microvascular ischemic changes, advanced for the patient's age. Electronically Signed   By: Elgie Collard M.D.   On: 12/06/2023 16:18    Microbiology: Results for orders placed or performed during the hospital encounter of 08/04/23  MRSA Next Gen by PCR, Nasal     Status: None   Collection Time: 08/05/23  3:08 PM  Specimen: Nasal Mucosa; Nasal Swab  Result Value Ref Range Status   MRSA by PCR Next Gen NOT DETECTED NOT DETECTED Final    Comment: (NOTE) The GeneXpert MRSA Assay (FDA approved for NASAL specimens only), is one component of a comprehensive MRSA colonization surveillance program. It is not intended to diagnose MRSA infection nor to guide or monitor treatment for MRSA infections. Test performance is not FDA  approved in patients less than 56 years old. Performed at [facility], [address], [place], [place] [address]     Labs: CBC: Recent Labs  Lab 12/06/23 1153 12/07/23 1146 12/08/23 0855 12/09/23 0636  WBC 3.5* 2.3* 2.9* 2.9*  NEUTROABS 2.7  --   --   --   HGB 12.8 12.4 13.0 13.7  HCT 37.4 35.4* 37.0 39.1  MCV 87.2 87.8 86.7 87.7  PLT 36* 25* 31* 41*   Basic Metabolic Panel: Recent Labs  Lab 12/06/23 1153 12/06/23 2005 12/07/23 1146 12/08/23 0855 12/09/23 0636  NA 137  --  131* 133* 136  K 3.6  --  3.0* 3.2* 3.9  CL 99  --  94* 98 101  CO2 20*  --  24 23 22   GLUCOSE 91  --  79 84 79  BUN 6  --  <5* <5* <5*  CREATININE 0.78  --  0.80 0.72 0.81  CALCIUM 7.8*  --  7.5* 7.8* 8.6*  MG 1.6* 1.9 1.5* 1.7 1.6*   Liver Function Tests: Recent Labs  Lab 12/06/23 1153 12/07/23 1146 12/08/23 0855 12/09/23 0636  AST 213* 208* 179* 164*  ALT 88* 80* 81* 84*  ALKPHOS 60 56 56 61  BILITOT 1.3* 1.6* 1.5* 1.2  PROT 6.3* 5.5* 5.9* 6.4*  ALBUMIN 3.6 3.0* 3.2* 3.3*   CBG: No results for input(s): "GLUCAP" in the last 168 hours.  Discharge time spent: less than 30 minutes.  Signed: Deanna Artis, DO [facility] 12/09/2023

## 2023-12-09 NOTE — Progress Notes (Signed)
 Discharge instructions discussed with patient, verbalized understanding. PIV removed. Patient left with all belongings in no acute or respiratory distress. Escorted to lobby in wheelchair.

## 2023-12-09 NOTE — TOC Transition Note (Signed)
 Transition of Care [facility]) - Discharge Note   Patient Details  Name: Quintana Canelo MRN: [id number] Date of Birth: 1991/01/18  [facility] CM/SW Contact:  Torry Istre A [place], LCSW Phone Number: 12/09/2023, 11:01 AM   Clinical Narrative:     CSW met with pt at bedside. She said that she was currently working on an application to [employer] and was planning to go there at discharge.   She stated that her mother was providing transportation at 1pm and take pt to recovery service provider.   CSW provided substance use resources at bedside if needed post discharge.   Provider and nursing notified. EDD today.   No other needs identified at this time. TOC will sign off, please consult again if TOC needs arise.    Final next level of care: IP Rehab Facility Barriers to Discharge: Barriers Resolved   Patient Goals and CMS Choice            Discharge Placement                Patient to be transferred to facility by: [facility]   Patient and family notified of of transfer: 12/09/23  Discharge Plan and Services Additional resources added to the After Visit Summary for                                       Social Drivers of Health (SDOH) Interventions SDOH Screenings   Food Insecurity: No Food Insecurity (12/06/2023)  Housing: Low Risk  (12/06/2023)  Transportation Needs: No Transportation Needs (12/06/2023)  Utilities: Not At Risk (12/06/2023)  Depression (PHQ2-9): Medium Risk (10/31/2022)  Social Connections: Unknown (01/16/2022)   Received from [facility]  Tobacco Use: Medium Risk (12/06/2023)     Readmission Risk Interventions     No data to display

## 2024-01-21 ENCOUNTER — Encounter (INDEPENDENT_AMBULATORY_CARE_PROVIDER_SITE_OTHER): Payer: Self-pay | Admitting: *Deleted

## 2024-06-19 NOTE — Telephone Encounter (Signed)
 Patient was read lab results and message from doctor
